# Patient Record
Sex: Female | Born: 1980
Health system: Southern US, Community
[De-identification: ages and names within clinical notes are randomized; demographics above are authoritative.]

## PROBLEM LIST (undated history)

## (undated) DIAGNOSIS — Z800 Family history of malignant neoplasm of digestive organs: Secondary | ICD-10-CM

## (undated) DIAGNOSIS — T7840XA Allergy, unspecified, initial encounter: Secondary | ICD-10-CM

## (undated) DIAGNOSIS — C801 Malignant (primary) neoplasm, unspecified: Secondary | ICD-10-CM

## (undated) DIAGNOSIS — N390 Urinary tract infection, site not specified: Secondary | ICD-10-CM

## (undated) DIAGNOSIS — F419 Anxiety disorder, unspecified: Secondary | ICD-10-CM

## (undated) DIAGNOSIS — Z9889 Other specified postprocedural states: Secondary | ICD-10-CM

## (undated) DIAGNOSIS — N63 Unspecified lump in unspecified breast: Secondary | ICD-10-CM

## (undated) DIAGNOSIS — F509 Eating disorder, unspecified: Secondary | ICD-10-CM

## (undated) DIAGNOSIS — R112 Nausea with vomiting, unspecified: Secondary | ICD-10-CM

## (undated) DIAGNOSIS — K900 Celiac disease: Secondary | ICD-10-CM

## (undated) HISTORY — PX: APPENDECTOMY: SHX54

## (undated) HISTORY — DX: Urinary tract infection, site not specified: N39.0

## (undated) HISTORY — DX: Allergy, unspecified, initial encounter: T78.40XA

## (undated) HISTORY — DX: Celiac disease: K90.0

## (undated) HISTORY — DX: Eating disorder, unspecified: F50.9

## (undated) HISTORY — DX: Family history of malignant neoplasm of digestive organs: Z80.0

## (undated) HISTORY — DX: Anxiety disorder, unspecified: F41.9

---

## 2008-12-13 HISTORY — PX: OTHER SURGICAL HISTORY: SHX169

## 2008-12-13 HISTORY — PX: AUGMENTATION MAMMAPLASTY: SUR837

## 2016-06-01 DIAGNOSIS — R509 Fever, unspecified: Secondary | ICD-10-CM | POA: Diagnosis not present

## 2016-06-01 DIAGNOSIS — H65192 Other acute nonsuppurative otitis media, left ear: Secondary | ICD-10-CM | POA: Diagnosis not present

## 2017-01-20 DIAGNOSIS — R6889 Other general symptoms and signs: Secondary | ICD-10-CM | POA: Diagnosis not present

## 2017-01-24 ENCOUNTER — Ambulatory Visit (INDEPENDENT_AMBULATORY_CARE_PROVIDER_SITE_OTHER): Payer: BLUE CROSS/BLUE SHIELD | Admitting: Family Medicine

## 2017-01-24 ENCOUNTER — Encounter: Payer: Self-pay | Admitting: Family Medicine

## 2017-01-24 VITALS — BP 90/64 | HR 72 | Temp 97.7°F | Ht 61.0 in | Wt 99.8 lb

## 2017-01-24 DIAGNOSIS — R6889 Other general symptoms and signs: Secondary | ICD-10-CM

## 2017-01-24 DIAGNOSIS — Z7689 Persons encountering health services in other specified circumstances: Secondary | ICD-10-CM

## 2017-01-24 NOTE — Patient Instructions (Signed)
It was a pleasure to meet you today! If your symptoms do not improve in 3 to 4 days, please let me know.  Also, please rest, drink plenty of fluids, and follow up for lab work as we discussed.

## 2017-01-24 NOTE — Progress Notes (Signed)
Pre visit review using our clinic review tool, if applicable. No additional management support is needed unless otherwise documented below in the visit note. 

## 2017-01-24 NOTE — Progress Notes (Signed)
Patient ID: Tabitha Brown, female   DOB: [birth year], 36 y.o.   MRN: [id number]  Patient presents to clinic today to establish care.  Acute Concerns:  Tabitha Brown is a 36 year old female who presents today with a cough that has been present for 5 days.  Cough is productive with yellow sputum but does not cause SOB.  Associated chills, myalgias, rhinitis with yellow drainage, ear pressure, and nasal congestion have been present.  She reports prior history of chills and sweats yesterday but she has not noticed this today.  Denies SOB, N/V, and tooth pain. Recent exposure to flu with her husband where she recently took Tamiflu prophylactically 3 weeks ago. She was evaluated and treated by urgent care 5 days ago where she was diagnosed with influenza and provided Tamiflu. She did not take tamiflu as she reports taking it previously.  She works as a Pharmacist, community. Treatment with advil and tylenol have provided moderate benefit for her symptoms today. She has a 37 year old and 78 month old. She is breastfeeding her 61 month old and has been wearing a mask for infection control.  History of exercise induced asthma. She denies use of albuterol during this episode. No history of bronchitis.   Health Maintenance: Dental --Twice Yearly [facility] --Contacts Immunizations --Influenza vaccine, Tdap PAP -- UTD, 2016 where she is followed by [facility] for her care.  Past Medical History:  Diagnosis Date  . Eating disorder   . UTI (urinary tract infection)   . UTI (urinary tract infection)      Social History   Social History  . Marital status: Married    Spouse name: N/A  . Number of children: N/A  . Years of education: N/A   Occupational History  . Not on file.   Social History Main Topics  . Smoking status: Never Smoker  . Smokeless tobacco: Never Used  . Alcohol use Yes  . Drug use: No  . Sexual activity: Yes   Other Topics Concern  . Not on file   Social History Narrative  . No narrative on  file    Past Surgical History:  Procedure Laterality Date  . APPENDECTOMY    . BREAST AUGMENTATION  2010  . CESAREAN SECTION      Family History  Problem Relation Age of Onset  . Alcohol abuse Maternal Grandfather   . Heart disease Maternal Grandfather   . Hypertension Paternal Grandmother     Allergies  Allergen Reactions  . Sulfa Antibiotics Hives  . Sulfonylureas     No current outpatient prescriptions on file prior to visit.   No current facility-administered medications on file prior to visit.     BP 90/64 (BP Location: Left Arm, Patient Position: Sitting, Cuff Size: Normal)   Pulse 72   Temp 97.7 F (36.5 C) (Oral)   Ht 5' 1"  (1.549 m)   Wt 99 lb 12.8 oz (45.3 kg)   LMP 01/01/2017 (Exact Date)   SpO2 99%   BMI 18.86 kg/m       Review of Systems  Constitutional: Positive for chills.  HENT: Positive for congestion and sinus pain. Negative for sore throat.        Ear pressure  Respiratory: Positive for cough and sputum production. Negative for shortness of breath and wheezing.   Cardiovascular: Negative for chest pain and orthopnea.  Gastrointestinal: Negative for abdominal pain, nausea and vomiting.       Loose stools x 1 yesterday  Genitourinary: Negative for flank  pain and frequency.  Musculoskeletal: Positive for myalgias.  Skin: Negative for rash.  Neurological: Negative for dizziness and headaches.     Physical Exam  Constitutional: She is oriented to person, place, and time.  Thin, optimally nourished female.  HENT:  Right Ear: Tympanic membrane normal.  Left Ear: Tympanic membrane normal.  Nose: Rhinorrhea present. Right sinus exhibits no maxillary sinus tenderness and no frontal sinus tenderness. Left sinus exhibits no maxillary sinus tenderness and no frontal sinus tenderness.  Mouth/Throat: Mucous membranes are normal. No oropharyngeal exudate or posterior oropharyngeal erythema.  Eyes: Pupils are equal, round, and reactive to light. No  scleral icterus.  Neck: Neck supple.  Cardiovascular: Normal rate, regular rhythm and intact distal pulses.   Pulmonary/Chest: Effort normal and breath sounds normal. She has no wheezes. She has no rales.  Abdominal: Soft. Bowel sounds are normal. There is no tenderness.  Lymphadenopathy:    She has no cervical adenopathy.  Neurological: She is alert and oriented to person, place, and time.  Skin: Skin is warm and dry. No rash noted.     Assessment/Plan:  1. Flu-like symptoms Exam and history support viral etiology. Advised supportive measures of increasing fluids, rest, ibuprofen, or tylenol for discomfort.  She does not wish to take the tamiflu provided to her and at this point it is past the window of time for effectiveness. Follow up recommendations were provided such as fever >101, worsening symptoms, or cough that is worsening or causing use of albuterol. Handwashing discussed with infection control concerns while symptoms are present. She voiced understanding and agreed with plan.  2. Encounter to establish care Follow up for physical and lab work at her convenience. We reviewed the PMH, PSH, FH, SH, Meds and Allergies. -We provided refills for any medications we will prescribe as needed. -We addressed current concerns per orders and patient instructions. -We have asked for records for pertinent exams, studies, vaccines and notes from previous providers. -We have advised patient to follow up per instructions below.   Delano Metz, FNP-C

## 2017-07-26 DIAGNOSIS — N63 Unspecified lump in unspecified breast: Secondary | ICD-10-CM

## 2017-07-26 HISTORY — DX: Unspecified lump in unspecified breast: N63.0

## 2017-08-11 DIAGNOSIS — N949 Unspecified condition associated with female genital organs and menstrual cycle: Secondary | ICD-10-CM | POA: Diagnosis not present

## 2017-08-11 DIAGNOSIS — Z01419 Encounter for gynecological examination (general) (routine) without abnormal findings: Secondary | ICD-10-CM | POA: Diagnosis not present

## 2017-08-11 DIAGNOSIS — N6311 Unspecified lump in the right breast, upper outer quadrant: Secondary | ICD-10-CM | POA: Diagnosis not present

## 2017-08-16 ENCOUNTER — Other Ambulatory Visit: Payer: Self-pay | Admitting: Obstetrics and Gynecology

## 2017-08-16 DIAGNOSIS — N63 Unspecified lump in unspecified breast: Secondary | ICD-10-CM

## 2017-08-25 ENCOUNTER — Ambulatory Visit
Admission: RE | Admit: 2017-08-25 | Discharge: 2017-08-25 | Disposition: A | Discharge status: Home / Self Care | Payer: BLUE CROSS/BLUE SHIELD | Source: Ambulatory Visit | Attending: Obstetrics and Gynecology | Admitting: Obstetrics and Gynecology

## 2017-08-25 DIAGNOSIS — R922 Inconclusive mammogram: Secondary | ICD-10-CM | POA: Diagnosis not present

## 2017-08-25 DIAGNOSIS — N6489 Other specified disorders of breast: Secondary | ICD-10-CM | POA: Diagnosis not present

## 2017-08-25 DIAGNOSIS — N63 Unspecified lump in unspecified breast: Secondary | ICD-10-CM

## 2017-08-25 HISTORY — DX: Unspecified lump in unspecified breast: N63.0

## 2017-10-21 ENCOUNTER — Encounter: Payer: Self-pay | Admitting: Family Medicine

## 2017-10-21 ENCOUNTER — Ambulatory Visit: Payer: BLUE CROSS/BLUE SHIELD | Admitting: Family Medicine

## 2017-10-21 VITALS — BP 102/76 | HR 60 | Temp 97.9°F | Resp 12 | Ht 61.0 in | Wt 104.1 lb

## 2017-10-21 DIAGNOSIS — N390 Urinary tract infection, site not specified: Secondary | ICD-10-CM

## 2017-10-21 DIAGNOSIS — R399 Unspecified symptoms and signs involving the genitourinary system: Secondary | ICD-10-CM | POA: Diagnosis not present

## 2017-10-21 LAB — POCT URINALYSIS DIPSTICK
Bilirubin, UA: NEGATIVE
Blood, UA: NEGATIVE
GLUCOSE UA: NEGATIVE
Ketones, UA: NEGATIVE
Nitrite, UA: NEGATIVE
Protein, UA: NEGATIVE
SPEC GRAV UA: 1.01 (ref 1.010–1.025)
Urobilinogen, UA: 0.2 E.U./dL
pH, UA: 6.5 (ref 5.0–8.0)

## 2017-10-21 MED ORDER — NITROFURANTOIN MONOHYD MACRO 100 MG PO CAPS: 100.0000 mg | ORAL_CAPSULE | Freq: Two times a day (BID) | ORAL | 0 refills | Status: AC

## 2017-10-21 NOTE — Progress Notes (Signed)
HPI:   Tabitha Brown is a 36 y.o. female, who is here today complaining of dysuria that started yesterday.  She is reporting history of UTIs since age 63 due to "kinked" ureter.  She has had 1-2 UTI's in the past 5 years.  Urinary frequency: Yes Urinary urgency: Denies Incontinence: Denies Gross hematuria: Denies  Abdominal pain: She was having mild LLQ and RLQ dull pain, not sure of related to urinary symptoms.  She denies changes in bowel habits or blood in the stool.  Exacerbating or alleviating factors not identified.  Nausea or vomiting: Denies  Abnormal vaginal bleeding or discharge: Denies  LMP: A week ago Sexual activity: Yes, no more than usual. Due to work schedule she has been holding urine longer than usual. She is drinking fluids during the day as usual, tried to stay hydrated. She drinks vinegar with water daily.  OTC medications for this problem: None   Review of Systems  Constitutional: Negative for activity change, appetite change, fatigue and fever.  Gastrointestinal: Positive for abdominal pain. Negative for blood in stool, nausea and vomiting.       No changes in bowel habits.  Endocrine: Negative for polydipsia, polyphagia and polyuria.  Genitourinary: Positive for dysuria and frequency. Negative for decreased urine volume, hematuria, urgency, vaginal bleeding and vaginal discharge.  Musculoskeletal: Negative for back pain and myalgias.  Skin: Negative for pallor and rash.      Current Outpatient Medications on File Prior to Visit  Medication Sig Dispense Refill  . Prenatal Vit-Fe Fumarate-FA (PRENATAL MULTIVITAMIN) TABS tablet Take 1 tablet by mouth daily at 12 noon.     No current facility-administered medications on file prior to visit.      Past Medical History:  Diagnosis Date  . Breast mass 07/26/2017   Right breast lump x 1 month   . Eating disorder   . UTI (urinary tract infection)   . UTI (urinary tract infection)     Allergies  Allergen Reactions  . Sulfa Antibiotics Hives  . Sulfonylureas     Social History   Socioeconomic History  . Marital status: Married    Spouse name: None  . Number of children: None  . Years of education: None  . Highest education level: None  Social Needs  . Financial resource strain: None  . Food insecurity - worry: None  . Food insecurity - inability: None  . Transportation needs - medical: None  . Transportation needs - non-medical: None  Occupational History  . None  Tobacco Use  . Smoking status: Never Smoker  . Smokeless tobacco: Never Used  Substance and Sexual Activity  . Alcohol use: Yes  . Drug use: No  . Sexual activity: Yes  Other Topics Concern  . None  Social History Narrative  . None    Vitals:   10/21/17 1101  BP: 102/76  Pulse: 60  Resp: 12  Temp: 97.9 F (36.6 C)  SpO2: 98%   Body mass index is 19.67 kg/m.   Physical Exam  Nursing note and vitals reviewed. Constitutional: She is oriented to person, place, and time. She appears well-developed and well-nourished. No distress.  HENT:  Head: Normocephalic and atraumatic.  Mouth/Throat: Oropharynx is clear and moist and mucous membranes are normal.  Eyes: Conjunctivae are normal.  Cardiovascular: Normal rate and regular rhythm.  No murmur heard. Respiratory: Effort normal and breath sounds normal. No respiratory distress.  GI: Soft. She exhibits no mass. There is no tenderness.  There is no CVA tenderness.  Musculoskeletal: She exhibits no edema or tenderness.  Lymphadenopathy:    She has no cervical adenopathy.  Neurological: She is alert and oriented to person, place, and time. She has normal strength. Gait normal.  Skin: Skin is warm. No rash noted. No erythema.  Psychiatric: She has a normal mood and affect.  Adequate groomed,good eye contact.    ASSESSMENT AND PLAN:   Diagnoses and all orders for this visit:  UTI symptoms -     POCT urinalysis  dipstick  Urinary tract infection without hematuria, site unspecified -     nitrofurantoin, macrocrystal-monohydrate, (MACROBID) 100 MG capsule; Take 1 capsule (100 mg total) 2 (two) times daily for 5 days by mouth. -     Culture, Urine   Urine dipstick + LEUK.  Differential Dx's for dysuria or urinary symptoms discussed. Ucx ordered.   Empiric abx treatment started today and will be tailored according to Ucx results and susceptibility report. Instructed about warning signs. F/U if symptoms persist.    -Tabitha Brown was advised to return or notify a doctor immediately if symptoms worsen or persist or new concerns arise.     Mohd Clemons G. Martinique, MD  [facility]. [place] office.

## 2017-10-21 NOTE — Patient Instructions (Signed)
A few things to remember from today's visit:   UTI symptoms - Plan: POCT urinalysis dipstick  Urinary tract infection without hematuria, site unspecified - Plan: nitrofurantoin, macrocrystal-monohydrate, (MACROBID) 100 MG capsule    Adequate fluid intake, avoid holding urine for long hours, and over the counter Vit C OR cranberry capsules might help.  Today we will treat empirically with antibiotic, which we might need to change when urine culture comes back depending of bacteria susceptibility.  Seek immediate medical attention if severe abdominal pain, vomiting, fever/chills, or worsening symptoms. F/U if symptomatic are not any better after 2-3 days of antibiotic treatment.  Please be sure medication list is accurate. If a new problem present, please set up appointment sooner than planned today.

## 2017-10-22 LAB — URINE CULTURE
MICRO NUMBER: 81264853
SPECIMEN QUALITY: ADEQUATE

## 2018-01-31 ENCOUNTER — Encounter: Payer: Self-pay | Admitting: Family Medicine

## 2018-01-31 ENCOUNTER — Ambulatory Visit: Payer: BLUE CROSS/BLUE SHIELD | Admitting: Family Medicine

## 2018-01-31 VITALS — BP 104/60 | HR 65 | Temp 98.2°F | Resp 12 | Ht 61.0 in | Wt 103.4 lb

## 2018-01-31 DIAGNOSIS — G4489 Other headache syndrome: Secondary | ICD-10-CM

## 2018-01-31 DIAGNOSIS — M542 Cervicalgia: Secondary | ICD-10-CM

## 2018-01-31 DIAGNOSIS — R42 Dizziness and giddiness: Secondary | ICD-10-CM | POA: Diagnosis not present

## 2018-01-31 LAB — CBC
HCT: 40 % (ref 36.0–46.0)
Hemoglobin: 13.5 g/dL (ref 12.0–15.0)
MCHC: 33.8 g/dL (ref 30.0–36.0)
MCV: 92.8 fl (ref 78.0–100.0)
Platelets: 238 10*3/uL (ref 150.0–400.0)
RBC: 4.31 Mil/uL (ref 3.87–5.11)
RDW: 12.1 % (ref 11.5–15.5)
WBC: 5 10*3/uL (ref 4.0–10.5)

## 2018-01-31 LAB — TSH: TSH: 2.65 u[IU]/mL (ref 0.35–4.50)

## 2018-01-31 LAB — BASIC METABOLIC PANEL
BUN: 10 mg/dL (ref 6–23)
CALCIUM: 9.4 mg/dL (ref 8.4–10.5)
CHLORIDE: 104 meq/L (ref 96–112)
CO2: 30 meq/L (ref 19–32)
Creatinine, Ser: 0.73 mg/dL (ref 0.40–1.20)
GFR: 94.99 mL/min (ref >60.00)
Glucose, Bld: 80 mg/dL (ref 70–99)
POTASSIUM: 4 meq/L (ref 3.5–5.1)
SODIUM: 140 meq/L (ref 135–145)

## 2018-01-31 MED ORDER — CYCLOBENZAPRINE HCL 10 MG PO TABS: 5.0000 mg | ORAL_TABLET | Freq: Every day | ORAL | 0 refills | Status: DC

## 2018-01-31 NOTE — Patient Instructions (Signed)
A few things to remember from today's visit:   Cervicalgia - Plan: cyclobenzaprine (FLEXERIL) 10 MG tablet  Other headache syndrome - Plan: MR Brain Wo Contrast, CBC, TSH, Basic metabolic panel  Dizziness, nonspecific - Plan: MR Brain Wo Contrast, CBC, TSH, Basic metabolic panel   General Headache Without Cause A headache is pain or discomfort felt around the head or neck area. There are many causes and types of headaches. In some cases, the cause may not be found. Follow these instructions at home: Managing pain  Take over-the-counter and prescription medicines only as told by your doctor.  Lie down in a dark, quiet room when you have a headache.  If directed, apply ice to the head and neck area: ? Put ice in a plastic bag. ? Place a towel between your skin and the bag. ? Leave the ice on for 20 minutes, 2-3 times per day.  Use a heating pad or hot shower to apply heat to the head and neck area as told by your doctor.  Keep lights dim if bright lights bother you or make your headaches worse. Eating and drinking  Eat meals on a regular schedule.  Lessen how much alcohol you drink.  Lessen how much caffeine you drink, or stop drinking caffeine. General instructions  Keep all follow-up visits as told by your doctor. This is important.  Keep a journal to find out if certain things bring on headaches. For example, write down: ? What you eat and drink. ? How much sleep you get. ? Any change to your diet or medicines.  Relax by getting a massage or doing other relaxing activities.  Lessen stress.  Sit up straight. Do not tighten (tense) your muscles.  Do not use tobacco products. This includes cigarettes, chewing tobacco, or e-cigarettes. If you need help quitting, ask your doctor.  Exercise regularly as told by your doctor.  Get enough sleep. This often means 7-9 hours of sleep. Contact a doctor if:  Your symptoms are not helped by medicine.  You have a headache  that feels different than the other headaches.  You feel sick to your stomach (nauseous) or you throw up (vomit).  You have a fever. Get help right away if:  Your headache becomes really bad.  You keep throwing up.  You have a stiff neck.  You have trouble seeing.  You have trouble speaking.  You have pain in the eye or ear.  Your muscles are weak or you lose muscle control.  You lose your balance or have trouble walking.  You feel like you will pass out (faint) or you pass out.  You have confusion. This information is not intended to replace advice given to you by your health care provider. Make sure you discuss any questions you have with your health care provider. Document Released: 09/07/2008 Document Revised: 05/06/2016 Document Reviewed: 03/24/2015 Elsevier Interactive Patient Education  [employer].  Please be sure medication list is accurate. If a new problem present, please set up appointment sooner than planned today.
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
## 2018-02-01 ENCOUNTER — Telehealth: Payer: Self-pay | Admitting: Family Medicine

## 2018-02-01 NOTE — Telephone Encounter (Signed)
Copied from [place] [id number]. Topic: Quick Communication - Lab Results >> Feb 01, 2018  9:15 AM Lolita Rieger, RMA wrote: Pt returning call for lab results

## 2018-02-01 NOTE — Telephone Encounter (Signed)
I have tried to contact her 2 times,last time I left message.  Labs done are in normal range (CBC,renal function,electrolytes,and thyroid function test). Head and cervical MRI have already been approved by her insurance and pending.  Thanks, BJ

## 2018-02-02 NOTE — Telephone Encounter (Signed)
Patient informed of lab results and verbalized understanding.

## 2018-02-03 ENCOUNTER — Encounter: Payer: Self-pay | Admitting: Family Medicine

## 2018-02-16 ENCOUNTER — Other Ambulatory Visit: Payer: BLUE CROSS/BLUE SHIELD

## 2018-02-23 ENCOUNTER — Ambulatory Visit
Admission: RE | Admit: 2018-02-23 | Discharge: 2018-02-23 | Disposition: A | Discharge status: Home / Self Care | Payer: BLUE CROSS/BLUE SHIELD | Source: Ambulatory Visit | Attending: Family Medicine | Admitting: Family Medicine

## 2018-02-23 DIAGNOSIS — M542 Cervicalgia: Secondary | ICD-10-CM

## 2018-02-23 DIAGNOSIS — R42 Dizziness and giddiness: Secondary | ICD-10-CM | POA: Diagnosis not present

## 2018-02-23 DIAGNOSIS — R51 Headache: Secondary | ICD-10-CM | POA: Diagnosis not present

## 2018-02-23 DIAGNOSIS — M5030 Other cervical disc degeneration, unspecified cervical region: Secondary | ICD-10-CM | POA: Diagnosis not present

## 2018-02-23 DIAGNOSIS — G4489 Other headache syndrome: Secondary | ICD-10-CM

## 2018-02-25 ENCOUNTER — Encounter: Payer: Self-pay | Admitting: Family Medicine

## 2018-02-27 ENCOUNTER — Encounter: Payer: Self-pay | Admitting: Family Medicine

## 2018-04-27 ENCOUNTER — Other Ambulatory Visit: Payer: Self-pay | Admitting: Family Medicine

## 2018-04-27 DIAGNOSIS — M542 Cervicalgia: Secondary | ICD-10-CM

## 2018-04-27 NOTE — Telephone Encounter (Signed)
Copied from [place] [id number]. Topic: Quick Communication - Rx Refill/Question >> Apr 27, 2018 12:43 PM Synthia Innocent wrote: Medication: cyclobenzaprine (FLEXERIL) 10 MG tablet  Has the patient contacted their pharmacy? Yes.   (Agent: If no, request that the patient contact the pharmacy for the refill.) Preferred Pharmacy (with phone number or street name): [facility] [address] Agent: Please be advised that RX refills may take up to 3 business days. We ask that you follow-up with your pharmacy.

## 2018-04-27 NOTE — Telephone Encounter (Signed)
LOV 01/31/18 Dr. Martinique Last refilled 01/31/18  # 30  0 refill

## 2018-04-28 MED ORDER — CYCLOBENZAPRINE HCL 10 MG PO TABS: 5.0000 mg | ORAL_TABLET | Freq: Every day | ORAL | 2 refills | Status: DC

## 2018-05-04 DIAGNOSIS — N926 Irregular menstruation, unspecified: Secondary | ICD-10-CM | POA: Diagnosis not present

## 2018-05-18 DIAGNOSIS — E2839 Other primary ovarian failure: Secondary | ICD-10-CM | POA: Diagnosis not present

## 2018-05-18 DIAGNOSIS — Z31430 Encounter of female for testing for genetic disease carrier status for procreative management: Secondary | ICD-10-CM | POA: Diagnosis not present

## 2018-05-18 DIAGNOSIS — Z319 Encounter for procreative management, unspecified: Secondary | ICD-10-CM | POA: Diagnosis not present

## 2018-05-24 DIAGNOSIS — E039 Hypothyroidism, unspecified: Secondary | ICD-10-CM | POA: Diagnosis not present

## 2018-05-27 DIAGNOSIS — N83291 Other ovarian cyst, right side: Secondary | ICD-10-CM | POA: Diagnosis not present

## 2018-05-27 DIAGNOSIS — E288 Other ovarian dysfunction: Secondary | ICD-10-CM | POA: Diagnosis not present

## 2018-05-27 DIAGNOSIS — E2839 Other primary ovarian failure: Secondary | ICD-10-CM | POA: Diagnosis not present

## 2018-06-22 DIAGNOSIS — N926 Irregular menstruation, unspecified: Secondary | ICD-10-CM | POA: Diagnosis not present

## 2018-06-22 DIAGNOSIS — N979 Female infertility, unspecified: Secondary | ICD-10-CM | POA: Diagnosis not present

## 2018-06-22 DIAGNOSIS — E2839 Other primary ovarian failure: Secondary | ICD-10-CM | POA: Diagnosis not present

## 2018-06-22 DIAGNOSIS — N912 Amenorrhea, unspecified: Secondary | ICD-10-CM | POA: Diagnosis not present

## 2018-06-28 DIAGNOSIS — E2839 Other primary ovarian failure: Secondary | ICD-10-CM | POA: Diagnosis not present

## 2018-06-28 DIAGNOSIS — N979 Female infertility, unspecified: Secondary | ICD-10-CM | POA: Diagnosis not present

## 2018-06-28 DIAGNOSIS — N83292 Other ovarian cyst, left side: Secondary | ICD-10-CM | POA: Diagnosis not present

## 2018-06-28 DIAGNOSIS — N926 Irregular menstruation, unspecified: Secondary | ICD-10-CM | POA: Diagnosis not present

## 2018-07-05 DIAGNOSIS — Z3189 Encounter for other procreative management: Secondary | ICD-10-CM | POA: Diagnosis not present

## 2018-07-05 DIAGNOSIS — E2839 Other primary ovarian failure: Secondary | ICD-10-CM | POA: Diagnosis not present

## 2018-07-08 DIAGNOSIS — E2839 Other primary ovarian failure: Secondary | ICD-10-CM | POA: Diagnosis not present

## 2018-07-08 DIAGNOSIS — Z319 Encounter for procreative management, unspecified: Secondary | ICD-10-CM | POA: Diagnosis not present

## 2018-07-11 DIAGNOSIS — Z3189 Encounter for other procreative management: Secondary | ICD-10-CM | POA: Diagnosis not present

## 2018-07-30 DIAGNOSIS — Z3183 Encounter for assisted reproductive fertility procedure cycle: Secondary | ICD-10-CM | POA: Diagnosis not present

## 2018-08-03 DIAGNOSIS — N8500 Endometrial hyperplasia, unspecified: Secondary | ICD-10-CM | POA: Diagnosis not present

## 2018-08-03 DIAGNOSIS — Z3141 Encounter for fertility testing: Secondary | ICD-10-CM | POA: Diagnosis not present

## 2018-08-03 DIAGNOSIS — Z113 Encounter for screening for infections with a predominantly sexual mode of transmission: Secondary | ICD-10-CM | POA: Diagnosis not present

## 2018-08-03 DIAGNOSIS — E2839 Other primary ovarian failure: Secondary | ICD-10-CM | POA: Diagnosis not present

## 2018-08-17 DIAGNOSIS — Z3183 Encounter for assisted reproductive fertility procedure cycle: Secondary | ICD-10-CM | POA: Diagnosis not present

## 2018-08-24 DIAGNOSIS — Z3183 Encounter for assisted reproductive fertility procedure cycle: Secondary | ICD-10-CM | POA: Diagnosis not present

## 2018-08-28 DIAGNOSIS — N979 Female infertility, unspecified: Secondary | ICD-10-CM | POA: Diagnosis not present

## 2018-08-28 DIAGNOSIS — E2839 Other primary ovarian failure: Secondary | ICD-10-CM | POA: Diagnosis not present

## 2018-08-28 DIAGNOSIS — Z3183 Encounter for assisted reproductive fertility procedure cycle: Secondary | ICD-10-CM | POA: Diagnosis not present

## 2018-08-28 DIAGNOSIS — N926 Irregular menstruation, unspecified: Secondary | ICD-10-CM | POA: Diagnosis not present

## 2018-08-30 DIAGNOSIS — Z3183 Encounter for assisted reproductive fertility procedure cycle: Secondary | ICD-10-CM | POA: Diagnosis not present

## 2018-08-30 DIAGNOSIS — N926 Irregular menstruation, unspecified: Secondary | ICD-10-CM | POA: Diagnosis not present

## 2018-08-30 DIAGNOSIS — E2839 Other primary ovarian failure: Secondary | ICD-10-CM | POA: Diagnosis not present

## 2018-08-30 DIAGNOSIS — N979 Female infertility, unspecified: Secondary | ICD-10-CM | POA: Diagnosis not present

## 2018-09-01 DIAGNOSIS — N926 Irregular menstruation, unspecified: Secondary | ICD-10-CM | POA: Diagnosis not present

## 2018-09-01 DIAGNOSIS — N979 Female infertility, unspecified: Secondary | ICD-10-CM | POA: Diagnosis not present

## 2018-09-01 DIAGNOSIS — Z3183 Encounter for assisted reproductive fertility procedure cycle: Secondary | ICD-10-CM | POA: Diagnosis not present

## 2018-09-01 DIAGNOSIS — Z113 Encounter for screening for infections with a predominantly sexual mode of transmission: Secondary | ICD-10-CM | POA: Diagnosis not present

## 2018-09-04 DIAGNOSIS — Z113 Encounter for screening for infections with a predominantly sexual mode of transmission: Secondary | ICD-10-CM | POA: Diagnosis not present

## 2018-09-04 DIAGNOSIS — N926 Irregular menstruation, unspecified: Secondary | ICD-10-CM | POA: Diagnosis not present

## 2018-09-04 DIAGNOSIS — N979 Female infertility, unspecified: Secondary | ICD-10-CM | POA: Diagnosis not present

## 2018-09-04 DIAGNOSIS — Z3183 Encounter for assisted reproductive fertility procedure cycle: Secondary | ICD-10-CM | POA: Diagnosis not present

## 2018-09-06 DIAGNOSIS — Z3183 Encounter for assisted reproductive fertility procedure cycle: Secondary | ICD-10-CM | POA: Diagnosis not present

## 2018-09-06 DIAGNOSIS — N979 Female infertility, unspecified: Secondary | ICD-10-CM | POA: Diagnosis not present

## 2018-09-06 DIAGNOSIS — E2839 Other primary ovarian failure: Secondary | ICD-10-CM | POA: Diagnosis not present

## 2018-09-08 DIAGNOSIS — Z3183 Encounter for assisted reproductive fertility procedure cycle: Secondary | ICD-10-CM | POA: Diagnosis not present

## 2018-10-05 ENCOUNTER — Encounter: Payer: Self-pay | Admitting: Family Medicine

## 2018-10-05 ENCOUNTER — Ambulatory Visit: Payer: BLUE CROSS/BLUE SHIELD | Admitting: Family Medicine

## 2018-10-05 VITALS — BP 98/64 | HR 84 | Temp 98.2°F | Wt 102.0 lb

## 2018-10-05 DIAGNOSIS — R52 Pain, unspecified: Secondary | ICD-10-CM | POA: Diagnosis not present

## 2018-10-05 DIAGNOSIS — J101 Influenza due to other identified influenza virus with other respiratory manifestations: Secondary | ICD-10-CM | POA: Diagnosis not present

## 2018-10-05 LAB — POCT INFLUENZA A/B
INFLUENZA A, POC: POSITIVE — AB
INFLUENZA B, POC: NEGATIVE

## 2018-10-05 NOTE — Addendum Note (Signed)
Addended by: Wyvonne Lenz on: 10/05/2018 05:51 PM   Modules accepted: Orders

## 2018-10-05 NOTE — Patient Instructions (Signed)

## 2018-10-05 NOTE — Progress Notes (Signed)
Subjective:    Patient ID: Tabitha Brown, female    DOB: [birth year], 37 y.o.   MRN: [id number]  No chief complaint on file.   HPI Patient was seen today for acute concern.  Pt endorses fatigue, chills, body aches, decreased appetite, cough, runny nose, loose stools since Sunday.  Pt has tried NyQuil and Tylenol Cold.  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   Past Medical History:  Diagnosis Date  . Breast mass 07/26/2017   Right breast lump x 1 month   . Eating disorder   . UTI (urinary tract infection)     Allergies  Allergen Reactions  . Sulfa Antibiotics Hives  . Sulfonylureas     ROS General: Denies fever, chills, night sweats, changes in weight, changes in appetite  + fatigue, chills, body aches, decreased appetite HEENT: Denies headaches, ear pain, changes in vision, rhinorrhea, sore throat CV: Denies CP, palpitations, SOB, orthopnea Pulm: Denies SOB, wheezing  +cough, rhinorrhea GI: Denies abdominal pain, nausea, vomiting, diarrhea, constipation  + loose stools GU: Denies dysuria, hematuria, frequency, vaginal discharge Msk: Denies muscle cramps, joint pains Neuro: Denies weakness, numbness, tingling Skin: Denies rashes, bruising Psych: Denies depression, anxiety, hallucinations     Objective:    Blood pressure 98/64, pulse 84, temperature 98.2 F (36.8 C), temperature source Oral, weight 102 lb (46.3 kg), SpO2 98 %.  Gen. Pleasant, well-nourished, in no distress, normal affect  HEENT: [place]/AT, face symmetric, no scleral icterus, PERRLA, nares patent without drainage, pharynx without erythema or exudate. TMs full b/l, mild cervical lymphadenopathy Lungs: no accessory muscle use, CTAB, no wheezes or rales Cardiovascular: RRR, no m/r/g, no peripheral edema  Wt Readings from Last 3 Encounters:  10/05/18 102 lb (46.3 kg)  01/31/18 103 lb 6 oz (46.9 kg)  10/21/17 104 lb 2 oz (47.2 kg)    Lab Results  Component  Value Date   WBC 5.0 01/31/2018   HGB 13.5 01/31/2018   HCT 40.0 01/31/2018   PLT 238.0 01/31/2018   GLUCOSE 80 01/31/2018   NA 140 01/31/2018   K 4.0 01/31/2018   CL 104 01/31/2018   CREATININE 0.73 01/31/2018   BUN 10 01/31/2018   CO2 30 01/31/2018   TSH 2.65 01/31/2018    Assessment/Plan:  Influenza A  -Given duration of symptoms antiviral such as Tamiflu would not be much benefit -Discussed supportive care such as gargling with warm salt water or Chloraseptic spray, Tylenol or ibuprofen for any pain/discomfort or fever, OTC cough and cold medications. -Discussed washing hands frequently and avoiding sharing utensils with others -Given RTC or ED precautions.  Follow-up PRN  Grier Mitts, MD

## 2018-10-06 ENCOUNTER — Ambulatory Visit: Payer: BLUE CROSS/BLUE SHIELD | Admitting: Family Medicine

## 2018-10-18 DIAGNOSIS — E2839 Other primary ovarian failure: Secondary | ICD-10-CM | POA: Diagnosis not present

## 2018-10-18 DIAGNOSIS — N926 Irregular menstruation, unspecified: Secondary | ICD-10-CM | POA: Diagnosis not present

## 2018-10-18 DIAGNOSIS — Z3183 Encounter for assisted reproductive fertility procedure cycle: Secondary | ICD-10-CM | POA: Diagnosis not present

## 2018-10-18 DIAGNOSIS — N979 Female infertility, unspecified: Secondary | ICD-10-CM | POA: Diagnosis not present

## 2018-11-15 DIAGNOSIS — N979 Female infertility, unspecified: Secondary | ICD-10-CM | POA: Diagnosis not present

## 2018-11-15 DIAGNOSIS — N644 Mastodynia: Secondary | ICD-10-CM | POA: Diagnosis not present

## 2018-11-15 DIAGNOSIS — E2839 Other primary ovarian failure: Secondary | ICD-10-CM | POA: Diagnosis not present

## 2018-11-15 DIAGNOSIS — N83291 Other ovarian cyst, right side: Secondary | ICD-10-CM | POA: Diagnosis not present

## 2018-11-15 DIAGNOSIS — N926 Irregular menstruation, unspecified: Secondary | ICD-10-CM | POA: Diagnosis not present

## 2018-12-14 DIAGNOSIS — N83291 Other ovarian cyst, right side: Secondary | ICD-10-CM | POA: Diagnosis not present

## 2018-12-14 DIAGNOSIS — N979 Female infertility, unspecified: Secondary | ICD-10-CM | POA: Diagnosis not present

## 2018-12-14 DIAGNOSIS — N926 Irregular menstruation, unspecified: Secondary | ICD-10-CM | POA: Diagnosis not present

## 2018-12-14 DIAGNOSIS — E2839 Other primary ovarian failure: Secondary | ICD-10-CM | POA: Diagnosis not present

## 2018-12-14 DIAGNOSIS — Z3183 Encounter for assisted reproductive fertility procedure cycle: Secondary | ICD-10-CM | POA: Diagnosis not present

## 2018-12-20 DIAGNOSIS — N979 Female infertility, unspecified: Secondary | ICD-10-CM | POA: Diagnosis not present

## 2018-12-20 DIAGNOSIS — E2839 Other primary ovarian failure: Secondary | ICD-10-CM | POA: Diagnosis not present

## 2018-12-20 DIAGNOSIS — Z3183 Encounter for assisted reproductive fertility procedure cycle: Secondary | ICD-10-CM | POA: Diagnosis not present

## 2018-12-22 DIAGNOSIS — Z3183 Encounter for assisted reproductive fertility procedure cycle: Secondary | ICD-10-CM | POA: Diagnosis not present

## 2018-12-22 DIAGNOSIS — E2839 Other primary ovarian failure: Secondary | ICD-10-CM | POA: Diagnosis not present

## 2018-12-22 DIAGNOSIS — N979 Female infertility, unspecified: Secondary | ICD-10-CM | POA: Diagnosis not present

## 2018-12-23 DIAGNOSIS — N979 Female infertility, unspecified: Secondary | ICD-10-CM | POA: Diagnosis not present

## 2018-12-23 DIAGNOSIS — E2839 Other primary ovarian failure: Secondary | ICD-10-CM | POA: Diagnosis not present

## 2018-12-23 DIAGNOSIS — Z3183 Encounter for assisted reproductive fertility procedure cycle: Secondary | ICD-10-CM | POA: Diagnosis not present

## 2018-12-25 DIAGNOSIS — Z3183 Encounter for assisted reproductive fertility procedure cycle: Secondary | ICD-10-CM | POA: Diagnosis not present

## 2018-12-30 DIAGNOSIS — Z3183 Encounter for assisted reproductive fertility procedure cycle: Secondary | ICD-10-CM | POA: Diagnosis not present

## 2019-01-21 ENCOUNTER — Encounter: Payer: Self-pay | Admitting: Family Medicine

## 2019-01-22 ENCOUNTER — Other Ambulatory Visit: Payer: Self-pay | Admitting: Family Medicine

## 2019-01-22 DIAGNOSIS — M542 Cervicalgia: Secondary | ICD-10-CM

## 2019-01-22 MED ORDER — CYCLOBENZAPRINE HCL 10 MG PO TABS: 5.0000 mg | ORAL_TABLET | Freq: Every day | ORAL | 1 refills | Status: DC

## 2019-02-02 DIAGNOSIS — N979 Female infertility, unspecified: Secondary | ICD-10-CM | POA: Diagnosis not present

## 2019-02-02 DIAGNOSIS — Z3183 Encounter for assisted reproductive fertility procedure cycle: Secondary | ICD-10-CM | POA: Diagnosis not present

## 2019-02-02 DIAGNOSIS — Z319 Encounter for procreative management, unspecified: Secondary | ICD-10-CM | POA: Diagnosis not present

## 2019-02-02 DIAGNOSIS — E2839 Other primary ovarian failure: Secondary | ICD-10-CM | POA: Diagnosis not present

## 2019-02-09 DIAGNOSIS — E2839 Other primary ovarian failure: Secondary | ICD-10-CM | POA: Diagnosis not present

## 2019-02-09 DIAGNOSIS — N979 Female infertility, unspecified: Secondary | ICD-10-CM | POA: Diagnosis not present

## 2019-02-09 DIAGNOSIS — Z3183 Encounter for assisted reproductive fertility procedure cycle: Secondary | ICD-10-CM | POA: Diagnosis not present

## 2019-02-12 DIAGNOSIS — E2839 Other primary ovarian failure: Secondary | ICD-10-CM | POA: Diagnosis not present

## 2019-02-12 DIAGNOSIS — Z3183 Encounter for assisted reproductive fertility procedure cycle: Secondary | ICD-10-CM | POA: Diagnosis not present

## 2019-02-12 DIAGNOSIS — N979 Female infertility, unspecified: Secondary | ICD-10-CM | POA: Diagnosis not present

## 2019-02-13 DIAGNOSIS — E2839 Other primary ovarian failure: Secondary | ICD-10-CM | POA: Diagnosis not present

## 2019-02-13 DIAGNOSIS — N979 Female infertility, unspecified: Secondary | ICD-10-CM | POA: Diagnosis not present

## 2019-02-13 DIAGNOSIS — Z3183 Encounter for assisted reproductive fertility procedure cycle: Secondary | ICD-10-CM | POA: Diagnosis not present

## 2019-02-15 DIAGNOSIS — Z3183 Encounter for assisted reproductive fertility procedure cycle: Secondary | ICD-10-CM | POA: Diagnosis not present

## 2019-02-20 DIAGNOSIS — Z3183 Encounter for assisted reproductive fertility procedure cycle: Secondary | ICD-10-CM | POA: Diagnosis not present

## 2019-02-21 DIAGNOSIS — Z3183 Encounter for assisted reproductive fertility procedure cycle: Secondary | ICD-10-CM | POA: Diagnosis not present

## 2019-05-29 ENCOUNTER — Other Ambulatory Visit: Payer: Self-pay | Admitting: Obstetrics and Gynecology

## 2019-05-29 DIAGNOSIS — N6310 Unspecified lump in the right breast, unspecified quadrant: Secondary | ICD-10-CM

## 2019-06-05 ENCOUNTER — Ambulatory Visit
Admission: RE | Admit: 2019-06-05 | Discharge: 2019-06-05 | Disposition: A | Discharge status: Home / Self Care | Payer: BC Managed Care – PPO | Source: Ambulatory Visit | Attending: Obstetrics and Gynecology | Admitting: Obstetrics and Gynecology

## 2019-06-05 ENCOUNTER — Other Ambulatory Visit: Payer: Self-pay | Admitting: Obstetrics and Gynecology

## 2019-06-05 ENCOUNTER — Other Ambulatory Visit: Payer: Self-pay

## 2019-06-05 ENCOUNTER — Ambulatory Visit
Admission: RE | Admit: 2019-06-05 | Discharge: 2019-06-05 | Disposition: A | Discharge status: Home / Self Care | Payer: BLUE CROSS/BLUE SHIELD | Source: Ambulatory Visit | Attending: Obstetrics and Gynecology | Admitting: Obstetrics and Gynecology

## 2019-06-05 DIAGNOSIS — N6310 Unspecified lump in the right breast, unspecified quadrant: Secondary | ICD-10-CM | POA: Diagnosis not present

## 2019-06-05 DIAGNOSIS — R922 Inconclusive mammogram: Secondary | ICD-10-CM | POA: Diagnosis not present

## 2019-07-08 DIAGNOSIS — Z20828 Contact with and (suspected) exposure to other viral communicable diseases: Secondary | ICD-10-CM | POA: Diagnosis not present

## 2019-08-17 DIAGNOSIS — N979 Female infertility, unspecified: Secondary | ICD-10-CM | POA: Diagnosis not present

## 2019-08-17 DIAGNOSIS — Z3183 Encounter for assisted reproductive fertility procedure cycle: Secondary | ICD-10-CM | POA: Diagnosis not present

## 2019-08-17 DIAGNOSIS — N83292 Other ovarian cyst, left side: Secondary | ICD-10-CM | POA: Diagnosis not present

## 2019-08-17 DIAGNOSIS — E2839 Other primary ovarian failure: Secondary | ICD-10-CM | POA: Diagnosis not present

## 2019-08-30 ENCOUNTER — Other Ambulatory Visit: Payer: Self-pay | Admitting: Plastic Surgery

## 2019-08-30 DIAGNOSIS — N63 Unspecified lump in unspecified breast: Secondary | ICD-10-CM

## 2019-08-30 DIAGNOSIS — N644 Mastodynia: Secondary | ICD-10-CM

## 2019-09-03 ENCOUNTER — Other Ambulatory Visit: Payer: Self-pay | Admitting: Plastic Surgery

## 2019-09-03 ENCOUNTER — Other Ambulatory Visit: Payer: Self-pay | Admitting: Obstetrics and Gynecology

## 2019-09-05 ENCOUNTER — Other Ambulatory Visit: Payer: Self-pay

## 2019-09-05 ENCOUNTER — Ambulatory Visit
Admission: RE | Admit: 2019-09-05 | Discharge: 2019-09-05 | Disposition: A | Discharge status: Home / Self Care | Payer: BC Managed Care – PPO | Source: Ambulatory Visit | Attending: Plastic Surgery | Admitting: Plastic Surgery

## 2019-09-05 DIAGNOSIS — N644 Mastodynia: Secondary | ICD-10-CM

## 2019-09-05 DIAGNOSIS — N63 Unspecified lump in unspecified breast: Secondary | ICD-10-CM

## 2019-09-05 DIAGNOSIS — N6314 Unspecified lump in the right breast, lower inner quadrant: Secondary | ICD-10-CM | POA: Diagnosis not present

## 2019-09-05 MED ORDER — GADOBUTROL 1 MMOL/ML IV SOLN
5.0000 mL | Freq: Once | INTRAVENOUS | Status: AC | PRN
  Administered 2019-09-05: 5 mL via INTRAVENOUS

## 2019-09-10 DIAGNOSIS — N979 Female infertility, unspecified: Secondary | ICD-10-CM | POA: Diagnosis not present

## 2019-09-10 DIAGNOSIS — E2839 Other primary ovarian failure: Secondary | ICD-10-CM | POA: Diagnosis not present

## 2019-09-10 DIAGNOSIS — Z3183 Encounter for assisted reproductive fertility procedure cycle: Secondary | ICD-10-CM | POA: Diagnosis not present

## 2019-09-12 ENCOUNTER — Other Ambulatory Visit: Payer: Self-pay | Admitting: Plastic Surgery

## 2019-09-12 DIAGNOSIS — N6310 Unspecified lump in the right breast, unspecified quadrant: Secondary | ICD-10-CM

## 2019-09-17 ENCOUNTER — Encounter: Payer: Self-pay | Admitting: Hematology and Oncology

## 2019-09-17 ENCOUNTER — Ambulatory Visit
Admission: RE | Admit: 2019-09-17 | Discharge: 2019-09-17 | Disposition: A | Discharge status: Home / Self Care | Payer: BC Managed Care – PPO | Source: Ambulatory Visit | Attending: Plastic Surgery | Admitting: Plastic Surgery

## 2019-09-17 ENCOUNTER — Other Ambulatory Visit: Payer: Self-pay

## 2019-09-17 ENCOUNTER — Other Ambulatory Visit: Payer: Self-pay | Admitting: Plastic Surgery

## 2019-09-17 DIAGNOSIS — R599 Enlarged lymph nodes, unspecified: Secondary | ICD-10-CM

## 2019-09-17 DIAGNOSIS — C50311 Malignant neoplasm of lower-inner quadrant of right female breast: Secondary | ICD-10-CM | POA: Diagnosis not present

## 2019-09-17 DIAGNOSIS — Z3141 Encounter for fertility testing: Secondary | ICD-10-CM | POA: Diagnosis not present

## 2019-09-17 DIAGNOSIS — N6310 Unspecified lump in the right breast, unspecified quadrant: Secondary | ICD-10-CM

## 2019-09-17 DIAGNOSIS — N8500 Endometrial hyperplasia, unspecified: Secondary | ICD-10-CM | POA: Diagnosis not present

## 2019-09-17 DIAGNOSIS — N6314 Unspecified lump in the right breast, lower inner quadrant: Secondary | ICD-10-CM | POA: Diagnosis not present

## 2019-09-18 ENCOUNTER — Telehealth: Payer: Self-pay | Admitting: Hematology and Oncology

## 2019-09-18 NOTE — Telephone Encounter (Signed)
Spoke to patient to confirm morning [facility] appointment for 10/14, packet emailed to patient

## 2019-09-19 ENCOUNTER — Encounter: Payer: Self-pay | Admitting: *Deleted

## 2019-09-19 DIAGNOSIS — Z170 Estrogen receptor positive status [ER+]: Secondary | ICD-10-CM

## 2019-09-19 DIAGNOSIS — C50311 Malignant neoplasm of lower-inner quadrant of right female breast: Secondary | ICD-10-CM | POA: Insufficient documentation

## 2019-09-21 ENCOUNTER — Other Ambulatory Visit: Payer: Self-pay

## 2019-09-21 ENCOUNTER — Ambulatory Visit (INDEPENDENT_AMBULATORY_CARE_PROVIDER_SITE_OTHER): Payer: BC Managed Care – PPO | Admitting: *Deleted

## 2019-09-21 DIAGNOSIS — Z23 Encounter for immunization: Secondary | ICD-10-CM | POA: Diagnosis not present

## 2019-09-21 NOTE — Progress Notes (Signed)
Per orders of Dr. Maudie Mercury  injection of Influenza Vaccine given by Zacarias Pontes. Patient tolerated injection well.

## 2019-09-25 NOTE — Progress Notes (Signed)
[facility] NOTE  Patient Care Team: [place], Betty G, MD as PCP - General (Family Medicine) Mauro Kaufmann, RN as Oncology Nurse Navigator Rockwell Germany, RN as Oncology Nurse Navigator Erroll Luna, MD as Consulting Physician (General Surgery) Nicholas Lose, MD as Consulting Physician (Hematology and Oncology) Gery Pray, MD as Consulting Physician (Radiation Oncology)  CHIEF COMPLAINTS/PURPOSE OF CONSULTATION:  Newly diagnosed breast cancer  HISTORY OF PRESENTING ILLNESS:  Tabitha Brown 38 y.o. female is here because of recent diagnosis of invasive ductal carcinoma of the right breast. The patient palpated a right breast mass. Diagnostic mammogram on 06/05/19 showed a probably benign right breast mass at the 4 o'clock position measuring 0.5cm. Short-term follow-up was recommended. Breast MRI on 09/05/19 showed a right breast mass at the 4 o'clock position with benign morphology. Biopsy on 09/17/19 showed invasive ductal carcinoma, grade 3, HER-2 negative by FISH, ER+ 95%, PR+ 95%, Ki67 10%. At the time the patient initially palpated the right breast mass she was undergoing hormonal treatment for planned in-vitro fertilization. She has no family history of breast cancer. She has a history of breast implants placed in 2010. She presents to the clinic today for initial evaluation and discussion of treatment options.   I reviewed her records extensively and collaborated the history with the patient.  SUMMARY OF ONCOLOGIC HISTORY: Oncology History  Malignant neoplasm of lower-inner quadrant of right breast of female, estrogen receptor positive ([place])  09/19/2019 Initial Diagnosis   Patient palpated a right breast mass. Mammogram on 06/05/19 showed a probably benign right breast mass at the 4 o'clock position measuring 0.5cm. Breast MRI on 09/05/19 showed a right breast mass at the 4 o'clock position with benign morphology. Biopsy showed IDC, grade 3, HER-2 - by FISH,  ER+ 95%, PR+ 95%, Ki67 10%.     09/26/2019 Cancer Staging   Staging form: Breast, AJCC 8th Edition - Clinical stage from 09/26/2019: Stage IA (cT1b, cN0, cM0, G3, ER+, PR+, HER2-) - Signed by Nicholas Lose, MD on 09/26/2019     MEDICAL HISTORY:  Past Medical History:  Diagnosis Date   Breast mass 07/26/2017   Right breast lump x 1 month    Eating disorder    UTI (urinary tract infection)     SURGICAL HISTORY: Past Surgical History:  Procedure Laterality Date   APPENDECTOMY     AUGMENTATION MAMMAPLASTY Bilateral 2010   BREAST AUGMENTATION  2010   CESAREAN SECTION      SOCIAL HISTORY: Social History   Socioeconomic History   Marital status: Married    Spouse name: Not on file   Number of children: Not on file   Years of education: Not on file   Highest education level: Not on file  Occupational History   Not on file  Social Needs   Financial resource strain: Not on file   Food insecurity    Worry: Not on file    Inability: Not on file   Transportation needs    Medical: Not on file    Non-medical: Not on file  Tobacco Use   Smoking status: Never Smoker   Smokeless tobacco: Never Used  Substance and Sexual Activity   Alcohol use: Yes    Comment: 1-2   Drug use: No   Sexual activity: Yes  Lifestyle   Physical activity    Days per week: Not on file    Minutes per session: Not on file   Stress: Not on file  Relationships  Social Herbalist on phone: Not on file    Gets together: Not on file    Attends religious service: Not on file    Active member of club or organization: Not on file    Attends meetings of clubs or organizations: Not on file    Relationship status: Not on file   Intimate partner violence    Fear of current or ex partner: Not on file    Emotionally abused: Not on file    Physically abused: Not on file    Forced sexual activity: Not on file  Other Topics Concern   Not on file  Social History  Narrative   Not on file    FAMILY HISTORY: Family History  Problem Relation Age of Onset   Alcohol abuse Maternal Grandfather        bile duct cancer   Heart disease Maternal Grandfather    Hypertension Paternal Grandmother     ALLERGIES:  is allergic to sulfa antibiotics and sulfonylureas.  MEDICATIONS:  Current Outpatient Medications  Medication Sig Dispense Refill   Ascorbic Acid (VITAMIN C PO) Take by mouth daily.     cyclobenzaprine (FLEXERIL) 10 MG tablet Take 0.5-1 tablets (5-10 mg total) by mouth at bedtime. 30 tablet 1   Multiple Vitamins-Minerals (ZINC PO) Take by mouth daily.     Prenatal Vit-Fe Fumarate-FA (PRENATAL MULTIVITAMIN) TABS tablet Take 1 tablet by mouth daily at 12 noon.     PROBIOTIC PRODUCT PO Take by mouth daily.     VITAMIN D, ERGOCALCIFEROL, PO Take by mouth daily.     Coenzyme Q10 (CO Q10 PO) Take by mouth daily.     levothyroxine (SYNTHROID, LEVOTHROID) 25 MCG tablet Take 25 mcg by mouth daily before breakfast.     No current facility-administered medications for this visit.     REVIEW OF SYSTEMS:   Constitutional: Denies fevers, chills or abnormal night sweats Eyes: Denies blurriness of vision, double vision or watery eyes Ears, nose, mouth, throat, and face: Denies mucositis or sore throat Respiratory: Denies cough, dyspnea or wheezes Cardiovascular: Denies palpitation, chest discomfort or lower extremity swelling Gastrointestinal:  Denies nausea, heartburn or change in bowel habits Skin: Denies abnormal skin rashes Lymphatics: Denies new lymphadenopathy or easy bruising Neurological:Denies numbness, tingling or new weaknesses Behavioral/Psych: Mood is stable, no new changes  Breast: Palpable right breast mass All other systems were reviewed with the patient and are negative.  PHYSICAL EXAMINATION: ECOG PERFORMANCE STATUS: 0 - Asymptomatic  Vitals:   09/26/19 0915  BP: 120/74  Pulse: (!) 103  Resp: 20  Temp: 98.1 F (36.7  C)  SpO2: 100%   Filed Weights   09/26/19 0915  Weight: 103 lb 1.6 oz (46.8 kg)    GENERAL:alert, no distress and comfortable SKIN: skin color, texture, turgor are normal, no rashes or significant lesions EYES: normal, conjunctiva are pink and non-injected, sclera clear OROPHARYNX:no exudate, no erythema and lips, buccal mucosa, and tongue normal  NECK: supple, thyroid normal size, non-tender, without nodularity LYMPH:  no palpable lymphadenopathy in the cervical, axillary or inguinal LUNGS: clear to auscultation and percussion with normal breathing effort HEART: regular rate & rhythm and no murmurs and no lower extremity edema ABDOMEN:abdomen soft, non-tender and normal bowel sounds Musculoskeletal:no cyanosis of digits and no clubbing  PSYCH: alert & oriented x 3 with fluent speech NEURO: no focal motor/sensory deficits BREAST: No palpable nodules in breast. No palpable axillary or supraclavicular lymphadenopathy (exam performed in the presence of  a chaperone)   LABORATORY DATA:  I have reviewed the data as listed Lab Results  Component Value Date   WBC 5.5 09/26/2019   HGB 14.0 09/26/2019   HCT 40.5 09/26/2019   MCV 88.6 09/26/2019   PLT 222 09/26/2019   Lab Results  Component Value Date   NA 142 09/26/2019   K 3.8 09/26/2019   CL 105 09/26/2019   CO2 27 09/26/2019    RADIOGRAPHIC STUDIES: I have personally reviewed the radiological reports and agreed with the findings in the report.  ASSESSMENT AND PLAN:  Malignant neoplasm of lower-inner quadrant of right breast of female, estrogen receptor positive ([place]) 09/19/19: Patient palpated a right breast mass. Mammogram on 06/05/19 showed a probably benign right breast mass at the 4 o'clock position measuring 0.5cm. Breast MRI on 09/05/19 showed a right breast mass at the 4 o'clock position with benign morphology. Biopsy showed IDC, grade 3, HER-2 - by FISH, ER+ 95%, PR+ 95%, Ki67 10%.  Pathology and radiology  counseling:Discussed with the patient, the details of pathology including the type of breast cancer,the clinical staging, the significance of ER, PR and HER-2/neu receptors and the implications for treatment. After reviewing the pathology in detail, we proceeded to discuss the different treatment options between surgery, radiation, chemotherapy, antiestrogen therapies.  Recommendations: 1. Breast conserving surgery followed by 2. Oncotype DX testing to determine if chemotherapy would be of any benefit followed by 3. Adjuvant radiation therapy followed by 4. Adjuvant antiestrogen therapy 5. Genetics  If genetics show BRCA mutation, she wants to do Bil mastectomies  Oncotype counseling: I discussed Oncotype DX test. I explained to the patient that this is a 21 gene panel to evaluate patient tumors DNA to calculate recurrence score. This would help determine whether patient has high risk or intermediate risk or low risk breast cancer. She understands that if her tumor was found to be high risk, she would benefit from systemic chemotherapy. If low risk, no need of chemotherapy. If she was found to be intermediate risk, we would need to evaluate the score as well as other risk factors and determine if an abbreviated chemotherapy may be of benefit.  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   All questions were answered. The patient knows to call the clinic with any problems, questions or concerns.   Rulon Eisenmenger, MD, MPH 09/26/2019    I, Molly Dorshimer, am acting as scribe for Nicholas Lose, MD.  I have reviewed the above documentation for accuracy and completeness, and I agree with the above.

## 2019-09-26 ENCOUNTER — Ambulatory Visit (HOSPITAL_BASED_OUTPATIENT_CLINIC_OR_DEPARTMENT_OTHER): Payer: BC Managed Care – PPO | Admitting: Genetic Counselor

## 2019-09-26 ENCOUNTER — Inpatient Hospital Stay: Payer: BC Managed Care – PPO | Attending: Hematology and Oncology | Admitting: Hematology and Oncology

## 2019-09-26 ENCOUNTER — Inpatient Hospital Stay: Payer: BC Managed Care – PPO

## 2019-09-26 ENCOUNTER — Encounter: Payer: Self-pay | Admitting: Genetic Counselor

## 2019-09-26 ENCOUNTER — Other Ambulatory Visit: Payer: Self-pay

## 2019-09-26 ENCOUNTER — Encounter: Payer: Self-pay | Admitting: *Deleted

## 2019-09-26 ENCOUNTER — Ambulatory Visit: Payer: BC Managed Care – PPO | Attending: Surgery | Admitting: Physical Therapy

## 2019-09-26 ENCOUNTER — Ambulatory Visit
Admission: RE | Admit: 2019-09-26 | Discharge: 2019-09-26 | Disposition: A | Discharge status: Home / Self Care | Payer: BC Managed Care – PPO | Source: Ambulatory Visit | Attending: Radiation Oncology | Admitting: Radiation Oncology

## 2019-09-26 ENCOUNTER — Encounter: Payer: Self-pay | Admitting: Hematology and Oncology

## 2019-09-26 ENCOUNTER — Encounter: Payer: Self-pay | Admitting: Physical Therapy

## 2019-09-26 DIAGNOSIS — R293 Abnormal posture: Secondary | ICD-10-CM | POA: Diagnosis not present

## 2019-09-26 DIAGNOSIS — Z811 Family history of alcohol abuse and dependence: Secondary | ICD-10-CM

## 2019-09-26 DIAGNOSIS — C50311 Malignant neoplasm of lower-inner quadrant of right female breast: Secondary | ICD-10-CM

## 2019-09-26 DIAGNOSIS — Z170 Estrogen receptor positive status [ER+]: Secondary | ICD-10-CM | POA: Diagnosis not present

## 2019-09-26 DIAGNOSIS — Z882 Allergy status to sulfonamides status: Secondary | ICD-10-CM | POA: Diagnosis not present

## 2019-09-26 DIAGNOSIS — C50911 Malignant neoplasm of unspecified site of right female breast: Secondary | ICD-10-CM | POA: Diagnosis not present

## 2019-09-26 DIAGNOSIS — Z8249 Family history of ischemic heart disease and other diseases of the circulatory system: Secondary | ICD-10-CM

## 2019-09-26 DIAGNOSIS — Z800 Family history of malignant neoplasm of digestive organs: Secondary | ICD-10-CM | POA: Insufficient documentation

## 2019-09-26 DIAGNOSIS — Z79899 Other long term (current) drug therapy: Secondary | ICD-10-CM

## 2019-09-26 LAB — CMP (CANCER CENTER ONLY)
ALT: 19 U/L (ref 0–44)
AST: 19 U/L (ref 15–41)
Albumin: 4.2 g/dL (ref 3.5–5.0)
Alkaline Phosphatase: 59 U/L (ref 38–126)
Anion gap: 10 (ref 5–15)
BUN: 9 mg/dL (ref 6–20)
CO2: 27 mmol/L (ref 22–32)
Calcium: 9.2 mg/dL (ref 8.9–10.3)
Chloride: 105 mmol/L (ref 98–111)
Creatinine: 0.86 mg/dL (ref 0.44–1.00)
GFR, Est AFR Am: >60 mL/min (ref >60)
GFR, Estimated: >60 mL/min (ref >60)
Glucose, Bld: 91 mg/dL (ref 70–99)
Potassium: 3.8 mmol/L (ref 3.5–5.1)
Sodium: 142 mmol/L (ref 135–145)
Total Bilirubin: 0.3 mg/dL (ref 0.3–1.2)
Total Protein: 7.1 g/dL (ref 6.5–8.1)

## 2019-09-26 LAB — CBC WITH DIFFERENTIAL (CANCER CENTER ONLY)
Abs Immature Granulocytes: 0.01 10*3/uL (ref 0.00–0.07)
Basophils Absolute: 0 10*3/uL (ref 0.0–0.1)
Basophils Relative: 1 %
Eosinophils Absolute: 0.2 10*3/uL (ref 0.0–0.5)
Eosinophils Relative: 3 %
HCT: 40.5 % (ref 36.0–46.0)
Hemoglobin: 14 g/dL (ref 12.0–15.0)
Immature Granulocytes: 0 %
Lymphocytes Relative: 37 %
Lymphs Abs: 2.1 10*3/uL (ref 0.7–4.0)
MCH: 30.6 pg (ref 26.0–34.0)
MCHC: 34.6 g/dL (ref 30.0–36.0)
MCV: 88.6 fL (ref 80.0–100.0)
Monocytes Absolute: 0.5 10*3/uL (ref 0.1–1.0)
Monocytes Relative: 9 %
Neutro Abs: 2.8 10*3/uL (ref 1.7–7.7)
Neutrophils Relative %: 50 %
Platelet Count: 222 10*3/uL (ref 150–400)
RBC: 4.57 MIL/uL (ref 3.87–5.11)
RDW: 11.4 % — ABNORMAL LOW (ref 11.5–15.5)
WBC Count: 5.5 10*3/uL (ref 4.0–10.5)
nRBC: 0 % (ref 0.0–0.2)

## 2019-09-26 NOTE — Progress Notes (Signed)
Radiation Oncology         [phone] ________________________________  Multidisciplinary Breast Oncology Clinic [facility]) Initial Outpatient Consultation  Name: Tabitha Brown MRN: [id number]  Date: 09/26/2019  DOB: Nov 24, 1981  HK:FEXMDY, Malka So, MD  Erroll Luna, MD   REFERRING PHYSICIAN: Erroll Luna, MD  DIAGNOSIS: The encounter diagnosis was Malignant neoplasm of lower-inner quadrant of right breast of female, estrogen receptor positive ([place]).  Clinical Stage IA, T1b Right Breast LIQ, Invasive Ductal Carcinoma, ER+ / PR+ / Her2-, Grade 3    ICD-10-CM   1. Malignant neoplasm of lower-inner quadrant of right breast of female, estrogen receptor positive ([place])  C50.311    Z17.0     HISTORY OF PRESENT ILLNESS::Tabitha Brown is a 38 y.o. female who is presenting to the office today for evaluation of her newly diagnosed breast cancer. She is accompanied by her husband via telephone. She is doing well overall.   She presented with a small palpable abnormality in the right breast for about a month. She underwent bilateral diagnostic mammography with tomography and right breast ultrasonography at [facility] on 06/05/2019 showing: probably-benign mass in the right breast at 4 o'clock. 3 month follow up was recommended.  She continued to have pain in the right breast region and wished to have her implants evaluated.  She returned for bilateral breast MRI on 09/05/2019, which showed: though the right breast mass at 4 o'clock has benign morphology, the washout kinetics would be unusual for a fibroadenoma; no evidence of malignancy in the left breast or bilateral lymph nodes.  Biopsy on 09/17/2019 showed: invasive ductal carcinoma, grade 3. Prognostic indicators significant for: estrogen receptor, 95% positive and progesterone receptor, 95% positive, both with strong staining intensity. Proliferation marker Ki67 at 10%. HER2 negative by FISH.  Menarche: 38 years old Age at  first live birth: 38 years old GP: 2 LMP: unsure, currently having regular periods Contraceptive: yes, used for approximately 4 years HRT: n/a   The patient was referred today for presentation in the multidisciplinary conference.  Radiology studies and pathology slides were presented there for review and discussion of treatment options.  A consensus was discussed regarding potential next steps.  PREVIOUS RADIATION THERAPY: No  PAST MEDICAL HISTORY:  Past Medical History:  Diagnosis Date   Breast mass 07/26/2017   Right breast lump x 1 month    Eating disorder    Family history of cholangiocarcinoma    UTI (urinary tract infection)     PAST SURGICAL HISTORY: Past Surgical History:  Procedure Laterality Date   APPENDECTOMY     AUGMENTATION MAMMAPLASTY Bilateral 2010   BREAST AUGMENTATION  2010   CESAREAN SECTION      FAMILY HISTORY:  Family History  Problem Relation Age of Onset   Alcohol abuse Maternal Grandfather        bile duct cancer   Heart disease Maternal Grandfather    Hypertension Paternal Grandmother     SOCIAL HISTORY:  Social History   Socioeconomic History   Marital status: Married    Spouse name: Not on file   Number of children: Not on file   Years of education: Not on file   Highest education level: Not on file  Occupational History   Not on file  Social Needs   Financial resource strain: Not on file   Food insecurity    Worry: Not on file    Inability: Not on file   Transportation needs    Medical: Not on  file    Non-medical: Not on file  Tobacco Use   Smoking status: Never Smoker   Smokeless tobacco: Never Used  Substance and Sexual Activity   Alcohol use: Yes    Comment: 1-2   Drug use: No   Sexual activity: Yes  Lifestyle   Physical activity    Days per week: Not on file    Minutes per session: Not on file   Stress: Not on file  Relationships   Social connections    Talks on phone: Not on file     Gets together: Not on file    Attends religious service: Not on file    Active member of club or organization: Not on file    Attends meetings of clubs or organizations: Not on file    Relationship status: Not on file  Other Topics Concern   Not on file  Social History Narrative   Not on file    ALLERGIES:  Allergies  Allergen Reactions   Sulfa Antibiotics Hives   Sulfonylureas     MEDICATIONS:  Current Outpatient Medications  Medication Sig Dispense Refill   Ascorbic Acid (VITAMIN C PO) Take by mouth daily.     Coenzyme Q10 (CO Q10 PO) Take by mouth daily.     cyclobenzaprine (FLEXERIL) 10 MG tablet Take 0.5-1 tablets (5-10 mg total) by mouth at bedtime. 30 tablet 1   levothyroxine (SYNTHROID, LEVOTHROID) 25 MCG tablet Take 25 mcg by mouth daily before breakfast.     Multiple Vitamins-Minerals (ZINC PO) Take by mouth daily.     Prenatal Vit-Fe Fumarate-FA (PRENATAL MULTIVITAMIN) TABS tablet Take 1 tablet by mouth daily at 12 noon.     PROBIOTIC PRODUCT PO Take by mouth daily.     VITAMIN D, ERGOCALCIFEROL, PO Take by mouth daily.     No current facility-administered medications for this encounter.     REVIEW OF SYSTEMS: A 10+ POINT REVIEW OF SYSTEMS WAS OBTAINED including neurology, dermatology, psychiatry, cardiac, respiratory, lymph, extremities, GI, GU, musculoskeletal, constitutional, reproductive, HEENT. On the provided form, she reports night sweats, burning pain to her breast, palpable breast lump, wearing contacts, and anxiety and depression regarding her diagnosis. She notes diffuse breast pain is mainly around the nipple. She denies weight change, cough, shortness of breath, nausea or vomiting, and any other symptoms.    PHYSICAL EXAM:  Vitals with BMI 09/26/2019  Height 5' 1"   Weight 103 lbs 2 oz  BMI 12.87  Systolic 867  Diastolic 74  Pulse 672  Lungs are clear to auscultation bilaterally. Heart has regular rate and rhythm. No palpable cervical,  supraclavicular, or axillary adenopathy. Abdomen soft, non-tender, normal bowel sounds. Breast: left breast with cosmetic implant in place, no palpable mass, nipple discharge, or bleeding. Right breast with bruising in the lower-inner quadrant, small palpable nodule approximately 5 mm in size, no nipple discharge or bleeding.  ECOG = 1  0 - Asymptomatic (Fully active, able to carry on all predisease activities without restriction)  1 - Symptomatic but completely ambulatory (Restricted in physically strenuous activity but ambulatory and able to carry out work of a light or sedentary nature. For example, light housework, office work)  2 - Symptomatic, <50% in bed during the day (Ambulatory and capable of all self care but unable to carry out any work activities. Up and about more than 50% of waking hours)  3 - Symptomatic, >50% in bed, but not bedbound (Capable of only limited self-care, confined to bed or chair 50%  or more of waking hours)  4 - Bedbound (Completely disabled. Cannot carry on any self-care. Totally confined to bed or chair)  5 - Death   Eustace Pen MM, Creech RH, Tormey DC, et al. [phone]). "Toxicity and response criteria of the [employer]". [place] Oncol. 5 (6): 649-55  LABORATORY DATA:  Lab Results  Component Value Date   WBC 5.5 09/26/2019   HGB 14.0 09/26/2019   HCT 40.5 09/26/2019   MCV 88.6 09/26/2019   PLT 222 09/26/2019   Lab Results  Component Value Date   NA 142 09/26/2019   K 3.8 09/26/2019   CL 105 09/26/2019   CO2 27 09/26/2019   Lab Results  Component Value Date   ALT 19 09/26/2019   AST 19 09/26/2019   ALKPHOS 59 09/26/2019   BILITOT 0.3 09/26/2019    PULMONARY FUNCTION TEST:   Recent Review Flowsheet Data    There is no flowsheet data to display.      RADIOGRAPHY: Mr Breast Bilateral W [place] Cad  Result Date: 09/06/2019 CLINICAL DATA:  38 year old female who presented in June of 2020 with a palpable lump in  the medial right breast. Diagnostic workup demonstrated a likely benign mass at 4 o'clock, 3 cm from the nipple which measured 5 mm. No family history of breast cancer. She has bilateral silicone implants which were placed in 2010. LABS:  None. EXAM: BILATERAL BREAST MRI WITH AND WITHOUT CONTRAST TECHNIQUE: Multiplanar, multisequence MR images of both breasts were obtained prior to and following the intravenous administration of 5 ml of Gadavist Three-dimensional MR images were rendered by post-processing of the original MR data on an independent workstation. The three-dimensional MR images were interpreted, and findings are reported in the following complete MRI report for this study. Three dimensional images were evaluated at the independent DynaCad workstation COMPARISON:  No prior MRI available for comparison. Correlation made with prior mammograms and ultrasound. FINDINGS: Breast composition: d. Extreme fibroglandular tissue. Background parenchymal enhancement: Mild. Right breast: There is a right retropectoral implant. No evidence of extracapsular silicone. In the lower-inner quadrant of the right breast there is a circumscribed oval enhancing mass measuring 6 x 4 x 5 mm. This corresponds in size and location with the palpable mass identified by ultrasound. There is probable associated bright T2 signal, though due to the small size it is difficult to be certain. However, there is prominent associated washout kinetics which would be unusual for a fibroadenoma. Left breast: There is a left retropectoral implant. No evidence of extracapsular silicone. No mass or abnormal enhancement. Lymph nodes: No abnormal appearing lymph nodes. Ancillary findings:  None. IMPRESSION: 1. Though the right breast mass at 4 o'clock has benign morphology, the washout kinetics would be unusual for a fibroadenoma. Further evaluation with biopsy is recommended. 2.  No evidence of malignancy in the left breast. RECOMMENDATION: 1.  Ultrasound-guided biopsy is recommended for the right breast mass at 4 o'clock. Ultrasound of the right axilla is recommended at the time of biopsy. BI-RADS CATEGORY  4: Suspicious. Electronically Signed   By: Ammie Ferrier M.D.   On: 09/06/2019 08:56   [place] Axilla Right  Result Date: 09/18/2019 CLINICAL DATA:  38 year old female with a suspicious mass in the right breast, here for evaluation of the right axilla prior to ultrasound-guided right breast biopsy. EXAM: ULTRASOUND OF THE RIGHT AXILLA COMPARISON:  MR dated 09/05/2019 FINDINGS: Targeted right axillary ultrasound was performed by the physician. Normal-appearing right axillary lymph nodes  are identified. No abnormal appearing lymph nodes are identified. No suspicious solid or cystic mass is identified. IMPRESSION: No abnormal appearing right axillary lymph nodes are identified. RECOMMENDATION: Recommend ultrasound-guided right breast biopsy which will be performed later today. I have discussed the findings and recommendations with the patient. If applicable, a reminder letter will be sent to the patient regarding the next appointment. BI-RADS CATEGORY  1: Negative. Electronically Signed   By: Zerita Boers M.D.   On: 09/18/2019 09:18   Mm Clip Placement Right  Result Date: 09/17/2019 CLINICAL DATA:  Palpable hypoechoic mass in the right breast status post ultrasound-guided biopsy EXAM: DIAGNOSTIC RIGHT MAMMOGRAM POST ULTRASOUND BIOPSY COMPARISON:  Previous exam(s). FINDINGS: Mammographic images were obtained following ultrasound guided biopsy of a hypoechoic mass in the right breast at 4 o'clock 3 cm from the nipple. A heart shaped biopsy marking clip is appropriately positioned at the site of biopsy. The patient felt lightheaded while the first mammogram view was obtained and given the appropriate positioning of the biopsy marking clip on the tomosynthesis images and on the ultrasound during the procedure, a second orthogonal view was deferred.  IMPRESSION: Heart shaped biopsy marking clip appropriately positioned at the site of biopsy in the right breast. Final Assessment: Post Procedure Mammograms for Marker Placement Electronically Signed   By: Zerita Boers M.D.   On: 09/17/2019 18:12   [place] Rt Breast Bx W Loc Dev 1st Lesion Img Bx Spec US Guide  Addendum Date: 09/18/2019   ADDENDUM REPORT: 09/18/2019 11:54 ADDENDUM: Pathology revealed GRADE III INVASIVE DUCTAL CARCINOMA of the Right breast, 4 o'clock, 3cmfn. This was found to be concordant by Dr. Zerita Boers. Pathology results were discussed with the patient by telephone by Dr. Zerita Boers. The patient reported doing well after the biopsy with tenderness at the site. Post biopsy instructions and care were reviewed and questions were answered. The patient was encouraged to call [facility] for any additional concerns. The patient was referred to [facility] on September 26, 2019. Pathology results reported by Terie Purser, RN on 09/18/2019. Electronically Signed   By: Zerita Boers M.D.   On: 09/18/2019 11:54   Result Date: 09/18/2019 CLINICAL DATA:  Palpable mass in the right breast. Patient has a history of silicone implant augmentation. EXAM: ULTRASOUND GUIDED RIGHT BREAST CORE NEEDLE BIOPSY COMPARISON:  Previous exam(s). FINDINGS: I met with the patient and we discussed the procedure of ultrasound-guided biopsy, including benefits and alternatives. We discussed the high likelihood of a successful procedure. We discussed the risks of the procedure, including infection, bleeding, tissue injury, clip migration, inadequate sampling, and implant rupture. Informed written consent was given. The usual time-out protocol was performed immediately prior to the procedure. Lesion quadrant: Lower inner quadrant Using sterile technique and 1% Lidocaine as local anesthetic, under direct ultrasound  visualization, a 14 gauge spring-loaded device was used to perform biopsy of a hypoechoic mass at 4 o'clock 3 cm from the nipple using a medial approach. At the conclusion of the procedure a heart shaped tissue marker clip was deployed into the biopsy cavity. Follow up mammogram was performed and dictated separately. IMPRESSION: Ultrasound guided biopsy of a hypoechoic mass in the right breast. No apparent complications. Electronically Signed: By: Zerita Boers M.D. On: 09/17/2019 18:10      IMPRESSION: Stage IA, T1b,N0 Right Breast LIQ, Invasive Ductal Carcinoma, ER+ / PR+ / Her2-, Grade 3  Patient will  be a good candidate for breast conservation with radiotherapy to the right breast. We discussed the general course of radiation, potential side effects, and toxicities with radiation and the patient is interested in this approach.    PLAN:  1. Genetic testing 2. Right lumpectomy with sentinel lymph node biopsy 3. Oncotype, depending on tumor size 4. Adjuvant radiation therapy 5. Antiestrogen therapy   ------------------------------------------------  Blair Promise, PhD, MD  This document serves as a record of services personally performed by Gery Pray, MD. It was created on his behalf by Wilburn Mylar, a trained medical scribe. The creation of this record is based on the scribe's personal observations and the provider's statements to them. This document has been checked and approved by the attending provider.

## 2019-09-26 NOTE — Progress Notes (Signed)
Clinical Social Work [place] Psychosocial Distress Screening [place]  Patient completed distress screening protocol and scored a 8 on the Psychosocial Distress Thermometer which indicates moderate distress. Clinical Social Worker met with patient in [facility] to assess for distress and other psychosocial needs. Patient stated she was feeling overwhelmed but felt comfortable after meeting with the treatment team.  Patient did express feeling anxious to finalize her treatment plan. CSW and patient discussed common feeling and emotions when being diagnosed with cancer, and the importance of support during treatment.  Patient stated she had a positive support system in her family, but was open to exploring counseling/support options.  CSW informed patient of the support team and support services at [facility]. CSW provided contact information and encouraged patient to call with any questions or concerns.  ONCBCN DISTRESS SCREENING 09/26/2019  Screening Type Initial Screening  Distress experienced in past week (1-10) 8  Emotional problem type Depression;Nervousness/Anxiety;Adjusting to illness  Information Concerns Type Lack of info about diagnosis;Lack of info about treatment;Lack of info about maintaining fitness  Physical Problem type Sleep/insomnia  Physician notified of physical symptoms Yes     Johnnye Lana, MSW, LCSW, OSW-C Clinical Social Worker [place] [phone]

## 2019-09-26 NOTE — Assessment & Plan Note (Signed)
09/19/19: Patient palpated a right breast mass. Mammogram on 06/05/19 showed a probably benign right breast mass at the 4 o'clock position measuring 0.5cm. Breast MRI on 09/05/19 showed a right breast mass at the 4 o'clock position with benign morphology. Biopsy showed IDC, grade 3, HER-2 - by FISH, ER+ 95%, PR+ 95%, Ki67 10%.  Pathology and radiology counseling:Discussed with the patient, the details of pathology including the type of breast cancer,the clinical staging, the significance of ER, PR and HER-2/neu receptors and the implications for treatment. After reviewing the pathology in detail, we proceeded to discuss the different treatment options between surgery, radiation, chemotherapy, antiestrogen therapies.  Recommendations: 1. Breast conserving surgery followed by 2. Oncotype DX testing to determine if chemotherapy would be of any benefit followed by 3. Adjuvant radiation therapy followed by 4. Adjuvant antiestrogen therapy 5. Genetics  If genetics show BRCA mutation, she wants to do Bil mastectomies  Oncotype counseling: I discussed Oncotype DX test. I explained to the patient that this is a 21 gene panel to evaluate patient tumors DNA to calculate recurrence score. This would help determine whether patient has high risk or intermediate risk or low risk breast cancer. She understands that if her tumor was found to be high risk, she would benefit from systemic chemotherapy. If low risk, no need of chemotherapy. If she was found to be intermediate risk, we would need to evaluate the score as well as other risk factors and determine if an abbreviated chemotherapy may be of benefit.  Return to clinic after surgery to discuss final pathology report and then determine if Oncotype DX testing will need to be sent.

## 2019-09-26 NOTE — Patient Instructions (Signed)

## 2019-09-26 NOTE — Therapy (Addendum)
[place], [place], [address] Phone: [phone]   Fax:  [phone]  Physical Therapy Evaluation  Patient Details  Name: Tabitha Brown MRN: [id number] Date of Birth: 1981/01/20 Referring Provider (PT): Dr. Erroll Luna   Encounter Date: 09/26/2019  PT End of Session - 09/26/19 0940    Visit Number  1    Number of Visits  2    Date for PT Re-Evaluation  11/21/19    PT Start Time  1133    PT Stop Time  1155    PT Time Calculation (min)  22 min    Activity Tolerance  Patient tolerated treatment well    Behavior During Therapy  [facility] for tasks assessed/performed       Past Medical History:  Diagnosis Date  . Breast mass 07/26/2017   Right breast lump x 1 month   . Eating disorder   . Family history of cholangiocarcinoma   . UTI (urinary tract infection)     Past Surgical History:  Procedure Laterality Date  . APPENDECTOMY    . AUGMENTATION MAMMAPLASTY Bilateral 2010  . BREAST AUGMENTATION  2010  . CESAREAN SECTION      There were no vitals filed for this visit.   Subjective Assessment - 09/26/19 0928    Subjective  Patient reports she is here today to be seen by her medical team for her newly diagnosed right breast cancer.    Pertinent History  Patient was diagnosed on 09/05/2019 with right grade III invasive ductal carcinoma breast cancer. It measures 5 mm and is located in the lower inner quadrant. It is ER/PR positive and HER2 negative with a Ki67 of 10%. She has previous breast implants from 2010.    Patient Stated Goals  Reduce lymphedema risk and learn post op shoulder ROM HEP    Currently in Pain?  Yes    Pain Score  --   "mild"   Pain Location  Arm    Pain Orientation  Right    Pain Descriptors / Indicators  Numbness;Tingling    Pain Type  Neuropathic pain    Pain Onset  In the past 7 days    Pain Frequency  Intermittent    Aggravating Factors   stretching arm    Pain  Relieving Factors  unknown    Multiple Pain Sites  No         [facility] PT Assessment - 09/26/19 0001      Assessment   Medical Diagnosis  Right breast cancer    Referring Provider (PT)  Dr. Marcello Moores Cornett    Onset Date/Surgical Date  09/05/19    Hand Dominance  Right    Prior Therapy  none      Precautions   Precautions  Other (comment)    Precaution Comments  active cancer      Restrictions   Weight Bearing Restrictions  No      Balance Screen   Has the patient fallen in the past 6 months  No    Has the patient had a decrease in activity level because of a fear of falling?   No    Is the patient reluctant to leave their home because of a fear of falling?   No      Home Environment   Living Environment  Private residence    Living Arrangements  Spouse/significant other;Children   Husband, 3 and 35 y.o. kids   Available Help at Discharge  Family      Prior Function   Level of Independence  Independent    Vocation  Full time employment    Archivist    Leisure  She runs 30-35 miles/week, daily power yoga, and weight training daily      Cognition   Overall Cognitive Status  Within Functional Limits for tasks assessed      Posture/Postural Control   Posture/Postural Control  Postural limitations    Postural Limitations  Rounded Shoulders      ROM / Strength   AROM / PROM / Strength  AROM;Strength      AROM   AROM Assessment Site  Shoulder    Right/Left Shoulder  Right;Left    Right Shoulder Extension  47 Degrees    Right Shoulder Flexion  147 Degrees    Right Shoulder ABduction  160 Degrees    Right Shoulder Internal Rotation  63 Degrees    Right Shoulder External Rotation  77 Degrees    Left Shoulder Extension  46 Degrees    Left Shoulder Flexion  150 Degrees    Left Shoulder ABduction  159 Degrees    Left Shoulder Internal Rotation  77 Degrees    Left Shoulder External Rotation  85 Degrees      Strength   Overall Strength  Within  functional limits for tasks performed        LYMPHEDEMA/ONCOLOGY QUESTIONNAIRE - 09/26/19 0939      Type   Cancer Type  Right breast cancer      Lymphedema Assessments   Lymphedema Assessments  Upper extremities      Right Upper Extremity Lymphedema   10 cm Proximal to Olecranon Process  21.9 cm    Olecranon Process  21.6 cm    10 cm Proximal to Ulnar Styloid Process  18.8 cm    Just Proximal to Ulnar Styloid Process  13.6 cm    Across Hand at [employer]  17.9 cm    At [place] of 2nd Digit  5.8 cm      Left Upper Extremity Lymphedema   10 cm Proximal to Olecranon Process  21.8 cm    Olecranon Process  20.8 cm    10 cm Proximal to Ulnar Styloid Process  19.2 cm    Just Proximal to Ulnar Styloid Process  13.5 cm    Across Hand at [employer]  17.7 cm    At [place] of 2nd Digit  5.4 cm          Quick Dash - 09/26/19 0001    Open a tight or new jar  No difficulty    Do heavy household chores (wash walls, wash floors)  No difficulty    Carry a shopping bag or briefcase  No difficulty    Wash your back  No difficulty    Use a knife to cut food  No difficulty    Recreational activities in which you take some force or impact through your arm, shoulder, or hand (golf, hammering, tennis)  No difficulty    During the past week, to what extent has your arm, shoulder or hand problem interfered with your normal social activities with family, friends, neighbors, or groups?  Not at all    During the past week, to what extent has your arm, shoulder or hand problem limited your work or other regular daily activities  Not at all    Arm, shoulder, or hand pain.  Mild  Tingling (pins and needles) in your arm, shoulder, or hand  Mild    Difficulty Sleeping  No difficulty    DASH Score  4.55 %        Objective measurements completed on examination: See above findings.     Patient was instructed today in a home exercise program today for post op shoulder range of motion. These  included active assist shoulder flexion in sitting, scapular retraction, wall walking with shoulder abduction, and hands behind head external rotation.  She was encouraged to do these twice a day, holding 3 seconds and repeating 5 times when permitted by her physician.             PT Education - 09/26/19 0939    Education Details  Lymphedema risk reduction and post op shoulder ROM HEP    Person(s) Educated  Patient    Methods  Explanation;Demonstration;Handout    Comprehension  Returned demonstration;Verbalized understanding          PT Long Term Goals - 09/26/19 0942      PT LONG TERM GOAL #1   Title  Patient will demonstrate she has regained full shoulder ROM and function post operativley compared ot baselines.    Time  8    Period  Weeks    Status  New    Target Date  11/21/19      Breast Clinic Goals - 09/26/19 7062      Patient will be able to verbalize understanding of pertinent lymphedema risk reduction practices relevant to her diagnosis specifically related to skin care.   Time  1    Period  Days    Status  Achieved      Patient will be able to return demonstrate and/or verbalize understanding of the post-op home exercise program related to regaining shoulder range of motion.   Time  1    Period  Days    Status  Achieved      Patient will be able to verbalize understanding of the importance of attending the postoperative After Breast Cancer Class for further lymphedema risk reduction education and therapeutic exercise.   Time  1    Period  Days    Status  Achieved            Plan - 09/26/19 0940    Clinical Impression Statement  Patient was diagnosed on 09/05/2019 with right grade III invasive ductal carcinoma breast cancer. It measures 5 mm and is located in the lower inner quadrant. It is ER/PR positive and HER2 negative with a Ki67 of 10%. She has previous breast implants from 2010. Her multidisciplinary medical team met prior to her assessments  to determine a recommended treatment plan. She is planning to have a right lumpectomy and sentinel node biopsy followed by Oncotype testing if mass is > 5 mm, radiation, and anti-estrogen therapy. She will benefit from a post op PT reassessment to determine needs.    Stability/Clinical Decision Making  Stable/Uncomplicated    Clinical Decision Making  Low    Rehab Potential  Excellent    PT Frequency  --   Eval and 1 f/u visit   PT Treatment/Interventions  ADLs/Self Care Home Management;Therapeutic exercise;Patient/family education    PT Next Visit Plan  Will reassess 3-4 weeks post op to determine needs    PT Home Exercise Plan  Post op shoulder ROM HEP    Consulted and Agree with Plan of Care  Patient       Patient will  benefit from skilled therapeutic intervention in order to improve the following deficits and impairments:  Decreased knowledge of precautions, Postural dysfunction, Decreased range of motion, Impaired UE functional use, Pain  Visit Diagnosis: Malignant neoplasm of lower-inner quadrant of right breast of female, estrogen receptor positive ([place]) - Plan: PT plan of care cert/re-cert  Abnormal posture   Patient will follow up at outpatient cancer rehab 3-4 weeks following surgery.  If the patient requires physical therapy at that time, a specific plan will be dictated and sent to the referring physician for approval. The patient was educated today on appropriate basic range of motion exercises to begin post operatively and the importance of attending the After Breast Cancer class following surgery.  Patient was educated today on lymphedema risk reduction practices as it pertains to recommendations that will benefit the patient immediately following surgery.  She verbalized good understanding.      Problem List Patient Active Problem List   Diagnosis Date Noted  . Family history of cholangiocarcinoma   . Malignant neoplasm of lower-inner quadrant of right breast of female,  estrogen receptor positive ([place]) 09/19/2019   Annia Friendly, PT 09/26/19 9:25 PM  [place], [place], [address] Phone: [phone]   Fax:  [phone]  Name: Carlia Bomkamp MRN: [id number] Date of Birth: 1980/12/28

## 2019-09-26 NOTE — Progress Notes (Signed)
REFERRING PROVIDER: Nicholas Lose, MD [place],  [place] [address]  PRIMARY PROVIDER:  [place], Betty G, MD  PRIMARY REASON FOR VISIT:  1. Malignant neoplasm of lower-inner quadrant of right breast of female, estrogen receptor positive ([place])   2. Family history of cholangiocarcinoma    I connected with Tabitha Brown on 09/27/2019 at 12:30 pm EDT by Webex video conference and verified that I am speaking with the correct person using two identifiers.   Patient location: clinic Provider location: office  HISTORY OF PRESENT ILLNESS:   Tabitha Brown, a 38 y.o. female, was seen for a [facility] cancer genetics consultation at the request of Dr. Lindi Adie due to a personal history of breast cancer.  Tabitha Brown presents to clinic today to discuss the possibility of a hereditary predisposition to cancer, genetic testing, and to further clarify her future cancer risks, as well as potential cancer risks for family members.   In 2020, at the age of 38, Tabitha Brown was diagnosed with IDC, ER+/PR+/Her2-  of the right breast.   CANCER HISTORY:  Oncology History  Malignant neoplasm of lower-inner quadrant of right breast of female, estrogen receptor positive ([place])  09/19/2019 Initial Diagnosis   Patient palpated a right breast mass. Mammogram on 06/05/19 showed a probably benign right breast mass at the 4 o'clock position measuring 0.5cm. Breast MRI on 09/05/19 showed a right breast mass at the 4 o'clock position with benign morphology. Biopsy showed IDC, grade 3, HER-2 - by FISH, ER+ 95%, PR+ 95%, Ki67 10%.     09/26/2019 Cancer Staging   Staging form: Breast, AJCC 8th Edition - Clinical stage from 09/26/2019: Stage IA (cT1b, cN0, cM0, G3, ER+, PR+, HER2-) - Signed by Nicholas Lose, MD on 09/26/2019      RISK FACTORS:  Menarche was at age 5.  First live birth at age 26.  OCP use for approximately 5 years.  Ovaries intact: yes.  Hysterectomy: no.  Menopausal status:  premenopausal.  HRT use: 0 years. Colonoscopy: no. Mammogram within the last year: yes. Number of breast biopsies: 1.   Past Medical History:  Diagnosis Date  . Breast mass 07/26/2017   Right breast lump x 1 month   . Eating disorder   . Family history of cholangiocarcinoma   . UTI (urinary tract infection)     Past Surgical History:  Procedure Laterality Date  . APPENDECTOMY    . AUGMENTATION MAMMAPLASTY Bilateral 2010  . BREAST AUGMENTATION  2010  . CESAREAN SECTION      Social History   Socioeconomic History  . Marital status: Married    Spouse name: Not on file  . Number of children: Not on file  . Years of education: Not on file  . Highest education level: Not on file  Occupational History  . Not on file  Social Needs  . Financial resource strain: Not on file  . Food insecurity    Worry: Not on file    Inability: Not on file  . Transportation needs    Medical: Not on file    Non-medical: Not on file  Tobacco Use  . Smoking status: Never Smoker  . Smokeless tobacco: Never Used  Substance and Sexual Activity  . Alcohol use: Yes    Comment: 1-2  . Drug use: No  . Sexual activity: Yes  Lifestyle  . Physical activity    Days per week: Not on file    Minutes per session: Not on file  . Stress:  Not on file  Relationships  . Social Herbalist on phone: Not on file    Gets together: Not on file    Attends religious service: Not on file    Active member of club or organization: Not on file    Attends meetings of clubs or organizations: Not on file    Relationship status: Not on file  Other Topics Concern  . Not on file  Social History Narrative  . Not on file     FAMILY HISTORY:  We obtained a detailed, 4-generation family history.  Significant diagnoses are listed below: Family History  Problem Relation Age of Onset  . Alcohol abuse Maternal Grandfather        bile duct cancer  . Heart disease Maternal Grandfather   . Cancer Maternal  Grandfather 89       bile duct cancer  . Hypertension Paternal Grandmother    Tabitha Brown has two sons ages 39 and 63. She has two sisters and one brother, none of whom have had cancer.  Tabitha Brown mother is 39 and has not had cancer. She has two maternal aunts. Her maternal grandmother died at age 21, and her maternal grandfather died at age 96 with bile duct cancer. There are no other known diagnoses of cancer on her maternal side of the family.  Tabitha Brown father is 39 and has not had cancer. She has three paternal aunts and one paternal uncle. Her paternal grandmother died at age 64 and her paternal grandfather died at age 75. There are no know diagnoses of cancer on the paternal side of the family.  Tabitha Brown is unaware of previous family history of genetic testing for hereditary cancer risks. Patient's maternal ancestors are of [place], [place], and [place] descent, and paternal ancestors are of [place] and [place] descent.  Tabitha Brown had not heard that her paternal grandfather was specifically [place], but given that he was from [place] it is possible. There is no known consanguinity.  GENETIC COUNSELING ASSESSMENT: Tabitha Brown is a 38 y.o. female with a personal history of breast which is somewhat suggestive of a hereditary cancer syndrome and predisposition to cancer. We, therefore, discussed and recommended the following at today's visit.   DISCUSSION: We discussed that 5 - 10% of breast cancer is hereditary, with most cases associated with BRCA1/2.  There are other genes that can be associated with hereditary breast cancer syndromes.  These include ATM, CHEK2, PALB2, etc.  We discussed that testing is beneficial for several reasons including knowing about other cancer risks, identifying potential screening and risk-reduction options that may be appropriate, and to understand if other family members could be at risk for cancer and allow them to undergo genetic testing.   We  reviewed the characteristics, features and inheritance patterns of hereditary cancer syndromes. We also discussed genetic testing, including the appropriate family members to test, the process of testing, insurance coverage and turn-around-time for results. We discussed the implications of a negative, positive and/or variant of uncertain significant result. In order to get genetic test results in a timely manner so that Tabitha Brown can use these genetic test results for surgical decisions, we recommended Tabitha Brown pursue genetic testing for the [facility] Breast Cancer STAT panel. Once complete, we recommend Tabitha Brown pursue reflex genetic testing to the Common Hereditary Cancers panel.   The STAT Breast cancer panel offered by [facility] includes sequencing and rearrangement analysis for the following 9 genes:  ATM, BRCA1,  BRCA2, CDH1, CHEK2, PALB2, PTEN, STK11 and TP53.  The Common Hereditary Cancers Panel offered by [facility] includes sequencing and/or deletion duplication testing of the following 48 genes: APC, ATM, AXIN2, BARD1, BMPR1A, BRCA1, BRCA2, BRIP1, CDH1, CDK4, CDKN2A (p14ARF), CDKN2A (p16INK4a), CHEK2, CTNNA1, DICER1, EPCAM (Deletion/duplication testing only), GREM1 (promoter region deletion/duplication testing only), KIT, MEN1, MLH1, MSH2, MSH3, MSH6, MUTYH, NBN, NF1, NHTL1, PALB2, PDGFRA, PMS2, POLD1, POLE, PTEN, RAD50, RAD51C, RAD51D, RNF43, SDHB, SDHC, SDHD, SMAD4, SMARCA4. STK11, TP53, TSC1, TSC2, and VHL.  The following genes were evaluated for sequence changes only: SDHA and HOXB13 c.251G>A variant only.  Based on Ms. Kun personal history of cancer, she meets medical criteria for genetic testing. Despite that she meets criteria, she may still have an out of pocket cost.   PLAN: After considering the risks, benefits, and limitations, Ms. Leikam provided informed consent to pursue genetic testing and the blood sample was sent to [facility] for analysis of the Breast Cancer STAT panel  + Common Hereditary Cancers panel. Results should be available within approximately one weeks' time, at which point they will be disclosed by telephone to Tabitha Brown, as will any additional recommendations warranted by these results. Tabitha Brown will receive a summary of her genetic counseling visit and a copy of her results once available. This information will also be available in Epic.   Ms. Wieczorek questions were answered to her satisfaction today. Our contact information was provided should additional questions or concerns arise. Thank you for the referral and allowing [place] to share in the care of your patient.   Clint Guy, MS, [facility] Certified Genetic Counselor [email or web address] Phone: [phone]  The patient was seen for a total of 15 minutes in face-to-face genetic counseling.  This patient was discussed with Drs. Magrinat, Lindi Adie and/or Burr Medico who agrees with the above.    _______________________________________________________________________ For Office Staff:  Number of people involved in session: 1 Was an Intern/ student involved with case: no

## 2019-09-27 ENCOUNTER — Encounter: Payer: Self-pay | Admitting: Genetic Counselor

## 2019-10-03 ENCOUNTER — Telehealth: Payer: Self-pay | Admitting: Genetic Counselor

## 2019-10-03 ENCOUNTER — Encounter: Payer: Self-pay | Admitting: Genetic Counselor

## 2019-10-03 ENCOUNTER — Ambulatory Visit: Payer: Self-pay | Admitting: Genetic Counselor

## 2019-10-03 DIAGNOSIS — Z1379 Encounter for other screening for genetic and chromosomal anomalies: Secondary | ICD-10-CM | POA: Insufficient documentation

## 2019-10-03 NOTE — Progress Notes (Signed)
HPI:  Tabitha Brown was previously seen in the [place] clinic due to a personal history of breast cancer and concerns regarding a hereditary predisposition to cancer. Please refer to our prior cancer genetics clinic note for more information regarding our discussion, assessment and recommendations, at the time. Tabitha Brown recent genetic test results were disclosed to her, as were recommendations warranted by these results. These results and recommendations are discussed in more detail below.  CANCER HISTORY:  Oncology History  Malignant neoplasm of lower-inner quadrant of right breast of female, estrogen receptor positive ([place])  09/19/2019 Initial Diagnosis   Patient palpated a right breast mass. Mammogram on 06/05/19 showed a probably benign right breast mass at the 4 o'clock position measuring 0.5cm. Breast MRI on 09/05/19 showed a right breast mass at the 4 o'clock position with benign morphology. Biopsy showed IDC, grade 3, HER-2 - by FISH, ER+ 95%, PR+ 95%, Ki67 10%.     09/26/2019 Cancer Staging   Staging form: Breast, AJCC 8th Edition - Clinical stage from 09/26/2019: Stage IA (cT1b, cN0, cM0, G3, ER+, PR+, HER2-) - Signed by Nicholas Lose, MD on 09/26/2019   10/03/2019 Genetic Testing   Negative genetic testing:  No pathogenic variants detected on the [facility] Breast Cancer STAT panel with reflex to the Common Hereditary Cancers panel. A variant of uncertain significance was detected in the POLE gene, called c.6299C>T. The report date is 10/03/2019.   The STAT Breast cancer panel offered by [facility] includes sequencing and rearrangement analysis for the following 9 genes:  ATM, BRCA1, BRCA2, CDH1, CHEK2, PALB2, PTEN, STK11 and TP53.  The Common Hereditary Cancers Panel offered by [facility] includes sequencing and/or deletion duplication testing of the following 48 genes: APC, ATM, AXIN2, BARD1, BMPR1A, BRCA1, BRCA2, BRIP1, CDH1, CDK4, CDKN2A (p14ARF), CDKN2A (p16INK4a), CHEK2,  CTNNA1, DICER1, EPCAM (Deletion/duplication testing only), GREM1 (promoter region deletion/duplication testing only), KIT, MEN1, MLH1, MSH2, MSH3, MSH6, MUTYH, NBN, NF1, NHTL1, PALB2, PDGFRA, PMS2, POLD1, POLE, PTEN, RAD50, RAD51C, RAD51D, RNF43, SDHB, SDHC, SDHD, SMAD4, SMARCA4. STK11, TP53, TSC1, TSC2, and VHL.  The following genes were evaluated for sequence changes only: SDHA and HOXB13 c.251G>A variant only.      FAMILY HISTORY:  We obtained a detailed, 4-generation family history.  Significant diagnoses are listed below: Family History  Problem Relation Age of Onset   Alcohol abuse Maternal Grandfather        bile duct cancer   Heart disease Maternal Grandfather    Cancer Maternal Grandfather 89       bile duct cancer   Hypertension Paternal Grandmother     [name] has two sons ages 33 and 24. She has two sisters and one brother, none of whom have had cancer.  Tabitha Brown mother is 82 and has not had cancer. She has two maternal aunts. Her maternal grandmother died at age 80, and her maternal grandfather died at age 55 with bile duct cancer. There are no other known diagnoses of cancer on her maternal side of the family.  Tabitha Brown father is 79 and has not had cancer. She has three paternal aunts and one paternal uncle. Her paternal grandmother died at age 31 and her paternal grandfather died at age 62. There are no know diagnoses of cancer on the paternal side of the family.  Tabitha Brown is unaware of previous family history of genetic testing for hereditary cancer risks. Patient's maternal ancestors are of [place], [place], and [place] descent, and paternal ancestors are of [place]  and [place] descent.  Tabitha Brown had not heard that her paternal grandfather was specifically [place], but given that he was from [place] it is possible. There is no known consanguinity.  GENETIC TEST RESULTS: Genetic testing reported out on 10/03/2019 through the [facility] Breast Cancer STAT  with reflex to the Common Hereditary Cancers panel found no pathogenic variants. The STAT Breast cancer panel offered by [facility] includes sequencing and rearrangement analysis for the following 9 genes:  ATM, BRCA1, BRCA2, CDH1, CHEK2, PALB2, PTEN, STK11 and TP53.  The Common Hereditary Cancers Panel offered by [facility] includes sequencing and/or deletion duplication testing of the following 48 genes: APC, ATM, AXIN2, BARD1, BMPR1A, BRCA1, BRCA2, BRIP1, CDH1, CDK4, CDKN2A (p14ARF), CDKN2A (p16INK4a), CHEK2, CTNNA1, DICER1, EPCAM (Deletion/duplication testing only), GREM1 (promoter region deletion/duplication testing only), KIT, MEN1, MLH1, MSH2, MSH3, MSH6, MUTYH, NBN, NF1, NHTL1, PALB2, PDGFRA, PMS2, POLD1, POLE, PTEN, RAD50, RAD51C, RAD51D, RNF43, SDHB, SDHC, SDHD, SMAD4, SMARCA4. STK11, TP53, TSC1, TSC2, and VHL.  The following genes were evaluated for sequence changes only: SDHA and HOXB13 c.251G>A variant only.   The test report will be scanned into EPIC and located under the Molecular Pathology section of the Results Review tab.  A portion of the result report is included below for reference.    We discussed with Ms. Walle that because current genetic testing is not perfect, it is possible there may be a gene mutation in one of these genes that current testing cannot detect, but that chance is small.  We also discussed, that there could be another gene that has not yet been discovered, or that we have not yet tested, that is responsible for the cancer diagnoses in the family. It is also possible there is a hereditary cause for the cancer in the family that Ms. Lessig did not inherit and therefore was not identified in her testing.  Therefore, it is important to remain in touch with cancer genetics in the future so that we can continue to offer Ms. Ezekiel the most up to date genetic testing.   Genetic testing did identify a variant of uncertain significance (VUS) was identified in the POLE gene called  c.6299C>T.  At this time, it is unknown if this variant is associated with increased cancer risk or if this is a normal finding, but most variants such as this get reclassified to being inconsequential. It should not be used to make medical management decisions. With time, we suspect the lab will determine the significance of this variant, if any. If we do learn more about it, we will try to contact Ms. Tersigni to discuss it further. However, it is important to stay in touch with [place] periodically and keep the address and phone number up to date.  CANCER SCREENING RECOMMENDATIONS: Ms. Saldarriaga test result is considered negative (normal).  This means that we have not identified a hereditary cause for her personal and family history of cancer at this time. Most cancers happen by chance and this negative test suggests that her cancer may fall into this category.    While reassuring, this does not definitively rule out a hereditary predisposition to cancer. It is still possible that there could be genetic mutations that are undetectable by current technology. There could be genetic mutations in genes that have not been tested or identified to increase cancer risk.  Therefore, it is recommended she continue to follow the cancer management and screening guidelines provided by her oncology and primary healthcare provider.   An individual's cancer  risk and medical management are not determined by genetic test results alone. Overall cancer risk assessment incorporates additional factors, including personal medical history, family history, and any available genetic information that may result in a personalized plan for cancer prevention and surveillance.  RECOMMENDATIONS FOR FAMILY MEMBERS:  Individuals in this family might be at some increased risk of developing cancer, over the general population risk, simply due to the family history of cancer.  We recommended women in this family have a yearly mammogram beginning at  age 53, or 40 years younger than the earliest onset of cancer, an annual clinical breast exam, and perform monthly breast self-exams. Women in this family should also have a gynecological exam as recommended by their primary provider. All family members should have a colonoscopy by age 30.  FOLLOW-UP: Lastly, we discussed with Ms. Gasparyan that cancer genetics is a rapidly advancing field and it is possible that new genetic tests will be appropriate for her and/or her family members in the future. We encouraged her to remain in contact with cancer genetics on an annual basis so we can update her personal and family histories and let her know of advances in cancer genetics that may benefit this family.   Our contact number was provided. Ms. Raynes questions were answered to her satisfaction, and she knows she is welcome to call us at anytime with additional questions or concerns.   Clint Guy, MS, [facility] Certified Genetic Counselor [email or web address] Phone: [phone]

## 2019-10-03 NOTE — Telephone Encounter (Signed)
Revealed negative genetic testing.  Discussed that we do not know why she has breast cancer or why there is cancer in the family.  It could be due to a different gene that we are not testing, or our current technology may not be able detect something.  It will be important for her to keep in contact with genetics to keep up with whether additional testing may be appropriate in the future.  Genetic testing did detect a variant of uncertain significance in the POLE gene, called c.6299C>T.  Importantly, Tabitha Brown results are still considered normal and this variant should not impact her medical management.

## 2019-10-04 ENCOUNTER — Telehealth: Payer: Self-pay | Admitting: *Deleted

## 2019-10-04 NOTE — Telephone Encounter (Signed)
Spoke with patient to follow up from [facility] to assess navigation needs.  No needs at this time.

## 2019-10-05 ENCOUNTER — Other Ambulatory Visit: Payer: Self-pay | Admitting: General Surgery

## 2019-10-05 DIAGNOSIS — Z170 Estrogen receptor positive status [ER+]: Secondary | ICD-10-CM

## 2019-10-05 DIAGNOSIS — C50311 Malignant neoplasm of lower-inner quadrant of right female breast: Secondary | ICD-10-CM

## 2019-10-08 ENCOUNTER — Telehealth: Payer: Self-pay | Admitting: Hematology and Oncology

## 2019-10-08 NOTE — Telephone Encounter (Signed)
Scheduled appt per 10/26 sch message - pt is aware of appt date and time   

## 2019-10-11 ENCOUNTER — Encounter: Payer: Self-pay | Admitting: Hematology and Oncology

## 2019-10-11 NOTE — Pre-Procedure Instructions (Addendum)
[id number]: [phone]: [phone]     Your procedure is scheduled on Thursday, November 5th.  [facility].M., and check in at the Admitting office.  Call this number if you have problems the morning of surgery:  [phone] Call [phone] you have any questions prior to your surgery date Monday-Friday 8am-4pm    Remember:  Do not eat after midnight the night before your surgery  You may drink clear liquids until 6:30a.m. the morning of your surgery.   Clear liquids allowed are: Water, Non-Citrus Juices (without pulp), Carbonated Beverages, Clear Tea, Black Coffee Only, and Gatorade.  Please complete your PRE-SURGERY ENSURE that was provided to you by 6:30a.m. the morning of surgery.  Please, if able, drink it in one setting. DO NOT SIP.    Take these medicines the morning of surgery with A SIP OF WATER  As needed: cetirizine (ZYRTEC)  As of today, STOP taking any Aspirin (unless otherwise instructed by your surgeon), Aleve, Naproxen, Ibuprofen, Motrin, Advil, Goody's, BC's, all herbal medications, fish oil, and all vitamins.    The Morning of Surgery  Do not wear jewelry, make-up or nail polish.  Do not wear lotions, powders, or perfumes, or deodorant  Do not shave 48 hours prior to surgery.   Do not bring valuables to the hospital.  [facility] any belongings or valuables.  If you are a smoker, DO NOT Smoke 24 hours prior to surgery IF you wear a CPAP at night please bring your mask, tubing, and machine the morning of surgery   Remember that you must have someone to transport you home after your surgery, and remain with you for 24 hours if you are discharged the same day.   Contacts, glasses, hearing aids, dentures or bridgework may not be worn into surgery.    Leave your suitcase in the car.   After surgery it may be brought to your room.  For patients admitted to the hospital, discharge time will be determined by your treatment team.  Patients discharged the day of surgery will not be allowed to drive home.    Special instructions:   [facility] [place]- Preparing For Surgery  Before surgery, you can play an important role. Because skin is not sterile, your skin needs to be as free of germs as possible. You can reduce the number of germs on your skin by washing with CHG (chlorahexidine gluconate) Soap before surgery.  CHG is an antiseptic cleaner which kills germs and bonds with the skin to continue killing germs even after washing.    Oral Hygiene is also important to reduce your risk of infection.  Remember - BRUSH YOUR TEETH THE MORNING OF SURGERY WITH YOUR REGULAR TOOTHPASTE  Please do not use if you have an allergy to CHG or antibacterial soaps. If your skin becomes reddened/irritated stop using the CHG.  Do not shave (including legs and underarms) for at least 48 hours prior to first CHG shower. It is OK to shave your face.  Please follow these instructions carefully.   1. Shower the NIGHT BEFORE SURGERY and the MORNING OF SURGERY with CHG Soap.   2. If you chose to wash your hair, wash your hair first as usual with your normal shampoo.  3. After you shampoo, rinse your hair and body thoroughly to remove the shampoo.  4.  Use CHG as you would any other liquid soap. You can apply CHG directly to the skin and wash gently with a scrungie or a clean washcloth.   5. Apply the CHG Soap to your body ONLY FROM THE NECK DOWN.  Do not use on open wounds or open sores. Avoid contact with your eyes, ears, mouth and genitals (private parts). Wash Face and genitals (private parts)  with your normal soap.   6. Wash thoroughly, paying special attention to the area where your surgery will be performed.  7. Thoroughly rinse your body with warm water from the neck down.  8. DO NOT shower/wash  with your normal soap after using and rinsing off the CHG Soap.  9. Pat yourself dry with a CLEAN TOWEL.  10. Wear CLEAN PAJAMAS to bed the night before surgery, wear comfortable clothes the morning of surgery  11. Place CLEAN SHEETS on your bed the night of your first shower and DO NOT SLEEP WITH PETS.    Day of Surgery:  Do not apply any deodorants/lotions. Please shower the morning of surgery with the CHG soap  Please wear clean clothes to the hospital/surgery center.   Remember to brush your teeth WITH YOUR REGULAR TOOTHPASTE.   Please read over the following fact sheets that you were given.

## 2019-10-12 ENCOUNTER — Encounter (HOSPITAL_COMMUNITY)
Admission: RE | Admit: 2019-10-12 | Discharge: 2019-10-12 | Disposition: A | Discharge status: Home / Self Care | Payer: BC Managed Care – PPO | Source: Ambulatory Visit | Attending: General Surgery | Admitting: General Surgery

## 2019-10-12 ENCOUNTER — Other Ambulatory Visit: Payer: Self-pay

## 2019-10-12 ENCOUNTER — Encounter (HOSPITAL_COMMUNITY): Payer: Self-pay

## 2019-10-12 DIAGNOSIS — Z170 Estrogen receptor positive status [ER+]: Secondary | ICD-10-CM

## 2019-10-12 DIAGNOSIS — C50311 Malignant neoplasm of lower-inner quadrant of right female breast: Secondary | ICD-10-CM

## 2019-10-12 DIAGNOSIS — Z01812 Encounter for preprocedural laboratory examination: Secondary | ICD-10-CM | POA: Insufficient documentation

## 2019-10-12 HISTORY — DX: Malignant (primary) neoplasm, unspecified: C80.1

## 2019-10-12 LAB — CBC
HCT: 42.7 % (ref 36.0–46.0)
Hemoglobin: 14.7 g/dL (ref 12.0–15.0)
MCH: 31.6 pg (ref 26.0–34.0)
MCHC: 34.4 g/dL (ref 30.0–36.0)
MCV: 91.8 fL (ref 80.0–100.0)
Platelets: 200 10*3/uL (ref 150–400)
RBC: 4.65 MIL/uL (ref 3.87–5.11)
RDW: 11.4 % — ABNORMAL LOW (ref 11.5–15.5)
WBC: 4.5 10*3/uL (ref 4.0–10.5)
nRBC: 0 % (ref 0.0–0.2)

## 2019-10-12 LAB — POCT PREGNANCY, URINE: Preg Test, Ur: NEGATIVE

## 2019-10-12 NOTE — Progress Notes (Signed)
PCP - Dr. Betty Martinique Cardiologist - denies  PPM/ICD - N/A Device Orders -  Rep Notified -   Chest x-ray - N/A EKG - N/A Stress Test -denies  ECHO - denies Cardiac Cath - denies  Sleep Study - denies CPAP - denies  Blood Thinner Instructions:N/A Aspirin Instructions:N/A  ERAS Protcol -Yes PRE-SURGERY Ensure or G2- Ensure given.   COVID TEST- Monday, 11/2.   Anesthesia review: No  Patient denies shortness of breath, fever, cough and chest pain at PAT appointment   All instructions explained to the patient, with a verbal understanding of the material. Patient agrees to go over the instructions while at home for a better understanding. Patient also instructed to self quarantine after being tested for COVID-19. The opportunity to ask questions was provided.    Coronavirus Screening  Have you experienced the following symptoms:  Cough yes/no: No Fever (>100.52F)  yes/no: No Runny nose yes/no: No Sore throat yes/no: No Difficulty breathing/shortness of breath  yes/no: No  Have you or a family member traveled in the last 14 days and where? yes/no: No   If the patient indicates "YES" to the above questions, their PAT will be rescheduled to limit the exposure to others and, the surgeon will be notified. THE PATIENT WILL NEED TO BE ASYMPTOMATIC FOR 14 DAYS.   If the patient is not experiencing any of these symptoms, the PAT nurse will instruct them to NOT bring anyone with them to their appointment since they may have these symptoms or traveled as well.   Please remind your patients and families that hospital visitation restrictions are in effect and the importance of the restrictions.

## 2019-10-15 ENCOUNTER — Other Ambulatory Visit (HOSPITAL_COMMUNITY)
Admission: RE | Admit: 2019-10-15 | Discharge: 2019-10-15 | Disposition: A | Discharge status: Home / Self Care | Payer: BC Managed Care – PPO | Source: Ambulatory Visit | Attending: General Surgery | Admitting: General Surgery

## 2019-10-15 DIAGNOSIS — Z20828 Contact with and (suspected) exposure to other viral communicable diseases: Secondary | ICD-10-CM | POA: Insufficient documentation

## 2019-10-15 DIAGNOSIS — Z01812 Encounter for preprocedural laboratory examination: Secondary | ICD-10-CM | POA: Insufficient documentation

## 2019-10-15 LAB — SARS CORONAVIRUS 2 (TAT 6-24 HRS): SARS Coronavirus 2: NEGATIVE

## 2019-10-16 ENCOUNTER — Telehealth: Payer: Self-pay

## 2019-10-16 ENCOUNTER — Encounter (HOSPITAL_COMMUNITY)
Admission: RE | Admit: 2019-10-16 | Discharge: 2019-10-16 | Disposition: A | Discharge status: Home / Self Care | Payer: BC Managed Care – PPO | Source: Ambulatory Visit | Attending: Hematology and Oncology | Admitting: Hematology and Oncology

## 2019-10-16 ENCOUNTER — Other Ambulatory Visit: Payer: Self-pay

## 2019-10-16 ENCOUNTER — Ambulatory Visit (HOSPITAL_COMMUNITY)
Admission: RE | Admit: 2019-10-16 | Discharge: 2019-10-16 | Disposition: A | Discharge status: Home / Self Care | Payer: BC Managed Care – PPO | Source: Ambulatory Visit | Attending: Hematology and Oncology | Admitting: Hematology and Oncology

## 2019-10-16 ENCOUNTER — Encounter (HOSPITAL_COMMUNITY): Payer: Self-pay

## 2019-10-16 DIAGNOSIS — C50311 Malignant neoplasm of lower-inner quadrant of right female breast: Secondary | ICD-10-CM | POA: Insufficient documentation

## 2019-10-16 DIAGNOSIS — Z170 Estrogen receptor positive status [ER+]: Secondary | ICD-10-CM | POA: Diagnosis not present

## 2019-10-16 DIAGNOSIS — C50911 Malignant neoplasm of unspecified site of right female breast: Secondary | ICD-10-CM | POA: Diagnosis not present

## 2019-10-16 MED ORDER — TECHNETIUM TC 99M MEDRONATE IV KIT
20.7000 | PACK | Freq: Once | INTRAVENOUS | Status: AC | PRN
  Administered 2019-10-16: 20.7 via INTRAVENOUS

## 2019-10-16 MED ORDER — IOHEXOL 300 MG/ML  SOLN
75.0000 mL | Freq: Once | INTRAMUSCULAR | Status: AC | PRN
  Administered 2019-10-16: 08:00:00 75 mL via INTRAVENOUS

## 2019-10-16 MED ORDER — SODIUM CHLORIDE (PF) 0.9 % IJ SOLN
INTRAMUSCULAR | Status: AC
  Filled 2019-10-16: qty 50

## 2019-10-16 NOTE — Telephone Encounter (Signed)
Nutrition  Patient identified after attended [facility] on 10/14.    Chart reviewed.  Called to introduce self and service to [facility].  No answer.  Left message with call back number.  Vilda Zollner B. Zenia Resides, [place], [place] Registered Dietitian [phone] (pager)

## 2019-10-17 ENCOUNTER — Encounter (HOSPITAL_COMMUNITY): Payer: BC Managed Care – PPO

## 2019-10-17 ENCOUNTER — Ambulatory Visit
Admission: RE | Admit: 2019-10-17 | Discharge: 2019-10-17 | Disposition: A | Discharge status: Home / Self Care | Payer: BC Managed Care – PPO | Source: Ambulatory Visit | Attending: General Surgery | Admitting: General Surgery

## 2019-10-17 ENCOUNTER — Encounter (HOSPITAL_COMMUNITY): Admission: RE | Admit: 2019-10-17 | Payer: BC Managed Care – PPO | Source: Ambulatory Visit

## 2019-10-17 ENCOUNTER — Other Ambulatory Visit: Payer: Self-pay | Admitting: General Surgery

## 2019-10-17 DIAGNOSIS — Z170 Estrogen receptor positive status [ER+]: Secondary | ICD-10-CM

## 2019-10-17 DIAGNOSIS — C50311 Malignant neoplasm of lower-inner quadrant of right female breast: Secondary | ICD-10-CM

## 2019-10-17 DIAGNOSIS — C50911 Malignant neoplasm of unspecified site of right female breast: Secondary | ICD-10-CM | POA: Diagnosis not present

## 2019-10-18 ENCOUNTER — Encounter (HOSPITAL_COMMUNITY): Payer: BC Managed Care – PPO

## 2019-10-18 ENCOUNTER — Telehealth: Payer: Self-pay | Admitting: Hematology and Oncology

## 2019-10-18 ENCOUNTER — Other Ambulatory Visit: Payer: Self-pay

## 2019-10-18 ENCOUNTER — Ambulatory Visit (HOSPITAL_COMMUNITY)
Admission: RE | Admit: 2019-10-18 | Discharge: 2019-10-18 | Disposition: A | Discharge status: Home / Self Care | Payer: BC Managed Care – PPO | Attending: General Surgery | Admitting: General Surgery

## 2019-10-18 ENCOUNTER — Encounter (HOSPITAL_COMMUNITY): Payer: Self-pay

## 2019-10-18 ENCOUNTER — Ambulatory Visit (HOSPITAL_COMMUNITY)
Admission: RE | Admit: 2019-10-18 | Discharge: 2019-10-18 | Disposition: A | Discharge status: Home / Self Care | Payer: BC Managed Care – PPO | Source: Ambulatory Visit | Attending: General Surgery | Admitting: General Surgery

## 2019-10-18 ENCOUNTER — Encounter (HOSPITAL_COMMUNITY)
Admission: RE | Disposition: A | Discharge status: Home / Self Care | Payer: Self-pay | Source: Home / Self Care | Attending: General Surgery

## 2019-10-18 ENCOUNTER — Ambulatory Visit (HOSPITAL_COMMUNITY): Payer: BC Managed Care – PPO | Admitting: Anesthesiology

## 2019-10-18 ENCOUNTER — Ambulatory Visit
Admission: RE | Admit: 2019-10-18 | Discharge: 2019-10-18 | Disposition: A | Discharge status: Home / Self Care | Payer: BC Managed Care – PPO | Source: Ambulatory Visit | Attending: General Surgery | Admitting: General Surgery

## 2019-10-18 DIAGNOSIS — Z800 Family history of malignant neoplasm of digestive organs: Secondary | ICD-10-CM | POA: Insufficient documentation

## 2019-10-18 DIAGNOSIS — C50911 Malignant neoplasm of unspecified site of right female breast: Secondary | ICD-10-CM | POA: Diagnosis not present

## 2019-10-18 DIAGNOSIS — F509 Eating disorder, unspecified: Secondary | ICD-10-CM | POA: Insufficient documentation

## 2019-10-18 DIAGNOSIS — D0511 Intraductal carcinoma in situ of right breast: Secondary | ICD-10-CM | POA: Diagnosis not present

## 2019-10-18 DIAGNOSIS — C50311 Malignant neoplasm of lower-inner quadrant of right female breast: Secondary | ICD-10-CM | POA: Diagnosis not present

## 2019-10-18 DIAGNOSIS — Z811 Family history of alcohol abuse and dependence: Secondary | ICD-10-CM | POA: Diagnosis not present

## 2019-10-18 DIAGNOSIS — Z882 Allergy status to sulfonamides status: Secondary | ICD-10-CM | POA: Diagnosis not present

## 2019-10-18 DIAGNOSIS — Z170 Estrogen receptor positive status [ER+]: Secondary | ICD-10-CM | POA: Insufficient documentation

## 2019-10-18 DIAGNOSIS — D0581 Other specified type of carcinoma in situ of right breast: Secondary | ICD-10-CM | POA: Diagnosis not present

## 2019-10-18 DIAGNOSIS — Z681 Body mass index (BMI) 19 or less, adult: Secondary | ICD-10-CM | POA: Insufficient documentation

## 2019-10-18 DIAGNOSIS — G8918 Other acute postprocedural pain: Secondary | ICD-10-CM | POA: Diagnosis not present

## 2019-10-18 HISTORY — PX: BREAST LUMPECTOMY WITH RADIOACTIVE SEED AND SENTINEL LYMPH NODE BIOPSY: SHX6550

## 2019-10-18 SURGERY — BREAST LUMPECTOMY WITH RADIOACTIVE SEED AND SENTINEL LYMPH NODE BIOPSY
Anesthesia: General | Site: Breast | Laterality: Right

## 2019-10-18 MED ORDER — LACTATED RINGERS IV SOLN
INTRAVENOUS | Status: DC | PRN
  Administered 2019-10-18 (×2): via INTRAVENOUS

## 2019-10-18 MED ORDER — KETOROLAC TROMETHAMINE 15 MG/ML IJ SOLN
INTRAMUSCULAR | Status: AC
  Administered 2019-10-18: 10:00:00 15 mg via INTRAVENOUS
  Filled 2019-10-18: qty 1

## 2019-10-18 MED ORDER — FENTANYL CITRATE (PF) 250 MCG/5ML IJ SOLN
INTRAMUSCULAR | Status: DC | PRN
  Administered 2019-10-18 (×2): 50 ug via INTRAVENOUS

## 2019-10-18 MED ORDER — ONDANSETRON HCL 4 MG/2ML IJ SOLN
4.0000 mg | Freq: Once | INTRAMUSCULAR | Status: AC
  Administered 2019-10-18: 14:00:00 4 mg via INTRAVENOUS

## 2019-10-18 MED ORDER — CELECOXIB 200 MG PO CAPS
ORAL_CAPSULE | ORAL | Status: AC
  Administered 2019-10-18: 400 mg via ORAL
  Filled 2019-10-18: qty 2

## 2019-10-18 MED ORDER — MIDAZOLAM HCL 2 MG/2ML IJ SOLN
INTRAMUSCULAR | Status: AC
  Administered 2019-10-18: 10:00:00 2 mg via INTRAVENOUS
  Filled 2019-10-18: qty 2

## 2019-10-18 MED ORDER — CEFAZOLIN SODIUM-DEXTROSE 2-4 GM/100ML-% IV SOLN
INTRAVENOUS | Status: AC
  Filled 2019-10-18: qty 100

## 2019-10-18 MED ORDER — FENTANYL CITRATE (PF) 100 MCG/2ML IJ SOLN
INTRAMUSCULAR | Status: AC
  Filled 2019-10-18: qty 2

## 2019-10-18 MED ORDER — PROMETHAZINE HCL 25 MG/ML IJ SOLN
INTRAMUSCULAR | Status: AC
  Filled 2019-10-18: qty 1

## 2019-10-18 MED ORDER — FENTANYL CITRATE (PF) 250 MCG/5ML IJ SOLN
INTRAMUSCULAR | Status: AC
  Filled 2019-10-18: qty 5

## 2019-10-18 MED ORDER — PROMETHAZINE HCL 25 MG/ML IJ SOLN
6.2500 mg | INTRAMUSCULAR | Status: DC | PRN
  Administered 2019-10-18: 14:00:00 6.25 mg via INTRAVENOUS

## 2019-10-18 MED ORDER — ONDANSETRON HCL 4 MG/2ML IJ SOLN
INTRAMUSCULAR | Status: AC
  Filled 2019-10-18: qty 2

## 2019-10-18 MED ORDER — BUPIVACAINE-EPINEPHRINE (PF) 0.5% -1:200000 IJ SOLN
INTRAMUSCULAR | Status: DC | PRN
  Administered 2019-10-18: 4 mL

## 2019-10-18 MED ORDER — FENTANYL CITRATE (PF) 100 MCG/2ML IJ SOLN
100.0000 ug | Freq: Once | INTRAMUSCULAR | Status: AC
  Administered 2019-10-18: 10:00:00 100 ug via INTRAVENOUS

## 2019-10-18 MED ORDER — STERILE WATER FOR IRRIGATION IR SOLN
Status: DC | PRN
  Administered 2019-10-18: 1000 mL

## 2019-10-18 MED ORDER — MIDAZOLAM HCL 2 MG/2ML IJ SOLN
2.0000 mg | Freq: Once | INTRAMUSCULAR | Status: AC
  Administered 2019-10-18: 10:00:00 2 mg via INTRAVENOUS

## 2019-10-18 MED ORDER — PROPOFOL 10 MG/ML IV BOLUS
INTRAVENOUS | Status: AC
  Filled 2019-10-18: qty 20

## 2019-10-18 MED ORDER — GABAPENTIN 100 MG PO CAPS
ORAL_CAPSULE | ORAL | Status: AC
  Administered 2019-10-18: 100 mg via ORAL
  Filled 2019-10-18: qty 1

## 2019-10-18 MED ORDER — SODIUM CHLORIDE (PF) 0.9 % IJ SOLN
INTRAMUSCULAR | Status: AC
  Filled 2019-10-18: qty 10

## 2019-10-18 MED ORDER — MIDAZOLAM HCL 2 MG/2ML IJ SOLN
INTRAMUSCULAR | Status: DC | PRN
  Administered 2019-10-18: 2 mg via INTRAVENOUS

## 2019-10-18 MED ORDER — PROPOFOL 10 MG/ML IV BOLUS
INTRAVENOUS | Status: DC | PRN
  Administered 2019-10-18: 40 mg via INTRAVENOUS
  Administered 2019-10-18: 120 mg via INTRAVENOUS

## 2019-10-18 MED ORDER — MORPHINE SULFATE (PF) 2 MG/ML IV SOLN: 1.0000 mg | INTRAVENOUS | Status: DC | PRN

## 2019-10-18 MED ORDER — ACETAMINOPHEN 500 MG PO TABS
1000.0000 mg | ORAL_TABLET | Freq: Once | ORAL | Status: AC
  Administered 2019-10-18: 10:00:00 1000 mg via ORAL

## 2019-10-18 MED ORDER — BUPIVACAINE-EPINEPHRINE 0.5% -1:200000 IJ SOLN
INTRAMUSCULAR | Status: AC
  Filled 2019-10-18: qty 1

## 2019-10-18 MED ORDER — METHYLENE BLUE 0.5 % INJ SOLN
INTRAVENOUS | Status: AC
  Filled 2019-10-18: qty 10

## 2019-10-18 MED ORDER — ENSURE PRE-SURGERY PO LIQD: 296.0000 mL | Freq: Once | ORAL | Status: DC

## 2019-10-18 MED ORDER — FENTANYL CITRATE (PF) 100 MCG/2ML IJ SOLN
25.0000 ug | INTRAMUSCULAR | Status: DC | PRN
  Administered 2019-10-18: 13:00:00 25 ug via INTRAVENOUS

## 2019-10-18 MED ORDER — OXYCODONE HCL 5 MG PO TABS: 5.0000 mg | ORAL_TABLET | Freq: Four times a day (QID) | ORAL | 0 refills | Status: DC | PRN

## 2019-10-18 MED ORDER — KETOROLAC TROMETHAMINE 15 MG/ML IJ SOLN
15.0000 mg | INTRAMUSCULAR | Status: AC
  Administered 2019-10-18: 10:00:00 15 mg via INTRAVENOUS

## 2019-10-18 MED ORDER — BUPIVACAINE HCL (PF) 0.25 % IJ SOLN
INTRAMUSCULAR | Status: DC | PRN
  Administered 2019-10-18: 15 mL

## 2019-10-18 MED ORDER — ACETAMINOPHEN 500 MG PO TABS: 1000.0000 mg | ORAL_TABLET | ORAL | Status: DC

## 2019-10-18 MED ORDER — SCOPOLAMINE 1 MG/3DAYS TD PT72
MEDICATED_PATCH | TRANSDERMAL | Status: AC
  Administered 2019-10-18: 1.5 mg via TRANSDERMAL
  Filled 2019-10-18: qty 1

## 2019-10-18 MED ORDER — BUPIVACAINE LIPOSOME 1.3 % IJ SUSP
INTRAMUSCULAR | Status: DC | PRN
  Administered 2019-10-18: 10 mL

## 2019-10-18 MED ORDER — FENTANYL CITRATE (PF) 100 MCG/2ML IJ SOLN
INTRAMUSCULAR | Status: AC
  Administered 2019-10-18: 10:00:00 100 ug via INTRAVENOUS
  Filled 2019-10-18: qty 2

## 2019-10-18 MED ORDER — EPHEDRINE SULFATE 50 MG/ML IJ SOLN
INTRAMUSCULAR | Status: DC | PRN
  Administered 2019-10-18 (×3): 5 mg via INTRAVENOUS

## 2019-10-18 MED ORDER — SCOPOLAMINE 1 MG/3DAYS TD PT72
1.0000 | MEDICATED_PATCH | TRANSDERMAL | Status: DC
  Administered 2019-10-18: 10:00:00 1.5 mg via TRANSDERMAL

## 2019-10-18 MED ORDER — DEXAMETHASONE SODIUM PHOSPHATE 10 MG/ML IJ SOLN
INTRAMUSCULAR | Status: DC | PRN
  Administered 2019-10-18: 5 mg via INTRAVENOUS

## 2019-10-18 MED ORDER — 0.9 % SODIUM CHLORIDE (POUR BTL) OPTIME
TOPICAL | Status: DC | PRN
  Administered 2019-10-18: 1000 mL

## 2019-10-18 MED ORDER — OXYCODONE HCL 5 MG PO TABS
5.0000 mg | ORAL_TABLET | ORAL | Status: DC | PRN
  Administered 2019-10-18: 13:00:00 5 mg via ORAL

## 2019-10-18 MED ORDER — OXYCODONE HCL 5 MG PO TABS
ORAL_TABLET | ORAL | Status: AC
  Filled 2019-10-18: qty 1

## 2019-10-18 MED ORDER — ACETAMINOPHEN 500 MG PO TABS
ORAL_TABLET | ORAL | Status: AC
  Administered 2019-10-18: 10:00:00 1000 mg via ORAL
  Filled 2019-10-18: qty 2

## 2019-10-18 MED ORDER — GABAPENTIN 100 MG PO CAPS
100.0000 mg | ORAL_CAPSULE | ORAL | Status: AC
  Administered 2019-10-18: 10:00:00 100 mg via ORAL

## 2019-10-18 MED ORDER — CELECOXIB 200 MG PO CAPS
400.0000 mg | ORAL_CAPSULE | Freq: Once | ORAL | Status: AC
  Administered 2019-10-18: 10:00:00 400 mg via ORAL

## 2019-10-18 MED ORDER — CEFAZOLIN SODIUM-DEXTROSE 2-4 GM/100ML-% IV SOLN
2.0000 g | INTRAVENOUS | Status: AC
  Administered 2019-10-18: 2 g via INTRAVENOUS

## 2019-10-18 MED ORDER — BUPIVACAINE HCL (PF) 0.25 % IJ SOLN
INTRAMUSCULAR | Status: AC
  Filled 2019-10-18: qty 30

## 2019-10-18 MED ORDER — TECHNETIUM TC 99M SULFUR COLLOID FILTERED
1.0000 | Freq: Once | INTRAVENOUS | Status: AC | PRN
  Administered 2019-10-18: 11:00:00 1 via INTRADERMAL

## 2019-10-18 MED ORDER — PHENYLEPHRINE 40 MCG/ML (10ML) SYRINGE FOR IV PUSH (FOR BLOOD PRESSURE SUPPORT)
PREFILLED_SYRINGE | INTRAVENOUS | Status: DC | PRN
  Administered 2019-10-18: 8 ug via INTRAVENOUS
  Administered 2019-10-18 (×4): 80 ug via INTRAVENOUS

## 2019-10-18 MED ORDER — MIDAZOLAM HCL 2 MG/2ML IJ SOLN
INTRAMUSCULAR | Status: AC
  Filled 2019-10-18: qty 2

## 2019-10-18 MED ORDER — ONDANSETRON HCL 4 MG/2ML IJ SOLN
INTRAMUSCULAR | Status: DC | PRN
  Administered 2019-10-18: 4 mg via INTRAVENOUS

## 2019-10-18 SURGICAL SUPPLY — 38 items
APPLIER CLIP 9.375 MED OPEN (MISCELLANEOUS) ×2
BINDER BREAST LRG (GAUZE/BANDAGES/DRESSINGS) ×1 IMPLANT
CANISTER SUCT 3000ML PPV (MISCELLANEOUS) ×2 IMPLANT
CHLORAPREP W/TINT 26 (MISCELLANEOUS) ×2 IMPLANT
CLIP APPLIE 9.375 MED OPEN (MISCELLANEOUS) IMPLANT
CLIP VESOCCLUDE MED 6/CT (CLIP) ×2 IMPLANT
CLSR STERI-STRIP ANTIMIC 1/2X4 (GAUZE/BANDAGES/DRESSINGS) ×1 IMPLANT
COVER PROBE W GEL 5X96 (DRAPES) ×2 IMPLANT
COVER SURGICAL LIGHT HANDLE (MISCELLANEOUS) ×2 IMPLANT
DERMABOND ADVANCED (GAUZE/BANDAGES/DRESSINGS) ×1
DERMABOND ADVANCED .7 DNX12 (GAUZE/BANDAGES/DRESSINGS) ×1 IMPLANT
DEVICE DUBIN SPECIMEN MAMMOGRA (MISCELLANEOUS) ×2 IMPLANT
DRAPE CHEST BREAST 15X10 FENES (DRAPES) ×2 IMPLANT
ELECT COATED BLADE 2.86 ST (ELECTRODE) ×2 IMPLANT
ELECT REM PT RETURN 9FT ADLT (ELECTROSURGICAL) ×2
ELECTRODE REM PT RTRN 9FT ADLT (ELECTROSURGICAL) ×1 IMPLANT
GLOVE BIO SURGEON STRL SZ7 (GLOVE) ×2 IMPLANT
GLOVE BIOGEL PI IND STRL 7.5 (GLOVE) ×1 IMPLANT
GLOVE BIOGEL PI INDICATOR 7.5 (GLOVE) ×1
GOWN STRL REUS W/ TWL LRG LVL3 (GOWN DISPOSABLE) ×2 IMPLANT
GOWN STRL REUS W/TWL LRG LVL3 (GOWN DISPOSABLE) ×2
KIT BASIN OR (CUSTOM PROCEDURE TRAY) ×2 IMPLANT
KIT MARKER MARGIN INK (KITS) ×2 IMPLANT
NDL HYPO 25GX1X1/2 BEV (NEEDLE) ×1 IMPLANT
NEEDLE HYPO 25GX1X1/2 BEV (NEEDLE) ×2 IMPLANT
NS IRRIG 1000ML POUR BTL (IV SOLUTION) ×2 IMPLANT
PACK GENERAL/GYN (CUSTOM PROCEDURE TRAY) ×2 IMPLANT
STRIP CLOSURE SKIN 1/2X4 (GAUZE/BANDAGES/DRESSINGS) ×2 IMPLANT
SUT MNCRL AB 4-0 PS2 18 (SUTURE) ×4 IMPLANT
SUT MON AB 5-0 PS2 18 (SUTURE) ×1 IMPLANT
SUT SILK 2 0 SH (SUTURE) ×1 IMPLANT
SUT VIC AB 2-0 SH 27 (SUTURE) ×2
SUT VIC AB 2-0 SH 27XBRD (SUTURE) ×2 IMPLANT
SUT VIC AB 3-0 SH 27 (SUTURE) ×2
SUT VIC AB 3-0 SH 27X BRD (SUTURE) ×2 IMPLANT
SYR CONTROL 10ML LL (SYRINGE) ×2 IMPLANT
TOWEL GREEN STERILE (TOWEL DISPOSABLE) ×2 IMPLANT
TOWEL GREEN STERILE FF (TOWEL DISPOSABLE) ×2 IMPLANT

## 2019-10-18 NOTE — Transfer of Care (Signed)
Immediate Anesthesia Transfer of Care Note  Patient: Tabitha Brown  Procedure(s) Performed: RIGHT BREAST LUMPECTOMY WITH RADIOACTIVE SEED AND RIGHT AXILLARY SENTINEL LYMPH NODE BIOPSY (Right Breast)  Patient Location: PACU  Anesthesia Type:General  Level of Consciousness: drowsy and patient cooperative  Airway & Oxygen Therapy: Patient Spontanous Breathing  Post-op Assessment: Report given to RN and Post -op Vital signs reviewed and stable  Post vital signs: Reviewed and stable  Last Vitals:  Vitals Value Taken Time  BP 97/68 10/18/19 1223  Temp    Pulse 114 10/18/19 1224  Resp 13 10/18/19 1224  SpO2 98 % 10/18/19 1224  Vitals shown include unvalidated device data.  Last Pain:  Vitals:   10/18/19 0935  TempSrc:   PainSc: 0-No pain         Complications: No apparent anesthesia complications

## 2019-10-18 NOTE — Telephone Encounter (Signed)
I informed the patient that the CT scans revealed a left axillary lymph node. We will request radiology to do an ultrasound and biopsy if it is abnormal.

## 2019-10-18 NOTE — Op Note (Signed)
Preoperative diagnosis:clinical stage I rightbreast cancer Postoperative diagnosis: Same as above Procedure:Rightbreastseed guided lumpectomy Rightdeep axillary sentinel node biopsy Surgeon: Dr. Serita Grammes Anesthesia: General  Specimens: 1.Rightbreast tissue marked with paint, containing seed  2. Additionalright breast medial/posterior margin containing clip 3. Additional right medial margin marked short superior, long medial, double deep 3.Rightdeep axillary nodes Estimated blood IDPO:24 cc Complications: None Drains: None Sponge and count was correct at completion Disposition to recovery in stable condition  Indications: This is [id number] who has a clinical stage I right breast cancer.We discussed options and have elected to proceed with lumpectomy/sn biopsy. The radioactive seed was placed prior to beginning. The seed was 9 mm away from the clip on mammography  Procedure: After informed consent was obtained shewas placed under general anesthesia without complication. She was given antibiotics. SCDs were in place. She was then prepped and draped in the standard sterile surgical fashion. A surgical timeout was then performed.  I then identified the seed in the medial  right breast.Imade a periareolar incision to hide the scar later.Iinfiltrated Marcaine.I then tunneled to the seed using the neoprobe for guidance. I then removed the seed and the surrounding tissue with an attempt to get a clear margin. The seed was on the specimen mammogram. there was a nodule that was really the posterior margin and maybe some medial of what I removed that was still attached to the pec. I was concerned initially this was capsule to implant but I did excise this and the clip was present.  I took some more medial margin. The smaller specimen otherwise is part of the larger specimen and was contained in it. The posterior margin was the pectoralis.I mobilized the breast tissue  and closed this with 2-0 Vicryl, 3-0 Vicryl, and5-0 Monocryl.glue and steristrips were applied.   I then made an incision below the axillary hairline and carried this through the axillary fascia. I then located the sentinel nodes in the low axilla. I then identified the sentinel node with a count of353.  I excised these. The background radioactivity was less than 10% and there were no palpable nodes present. I then obtained hemostasis. I closed the fascia with 2-0 vicryl. I closed the skin with 3-0 vicryl and 4-0 monocryl.  Glue was placed.  She had a binder placed. She was extubated and transferred to recovery stable

## 2019-10-18 NOTE — Discharge Instructions (Signed)
[place] Office Phone Number [phone]  BREAST BIOPSY/ PARTIAL MASTECTOMY: POST OP INSTRUCTIONS Take 400 mg of ibuprofen every 8 hours or 650 mg tylenol every 6 hours for next 72 hours then as needed. Use ice several times daily also. Always review your discharge instruction sheet given to you by the facility where your surgery was performed.  IF YOU HAVE DISABILITY OR FAMILY LEAVE FORMS, YOU MUST BRING THEM TO THE OFFICE FOR PROCESSING.  DO NOT GIVE THEM TO YOUR DOCTOR.  1. A prescription for pain medication may be given to you upon discharge.  Take your pain medication as prescribed, if needed.  If narcotic pain medicine is not needed, then you may take acetaminophen (Tylenol), naprosyn (Alleve) or ibuprofen (Advil) as needed. 2. Take your usually prescribed medications unless otherwise directed 3. If you need a refill on your pain medication, please contact your pharmacy.  They will contact our office to request authorization.  Prescriptions will not be filled after 5pm or on week-ends. 4. You should eat very light the first 24 hours after surgery, such as soup, crackers, pudding, etc.  Resume your normal diet the day after surgery. 5. Most patients will experience some swelling and bruising in the breast.  Ice packs and a good support bra will help.  Wear the breast binder provided or a sports bra for 72 hours day and night.  After that wear a sports bra during the day until you return to the office. Swelling and bruising can take several days to resolve.  6. It is common to experience some constipation if taking pain medication after surgery.  Increasing fluid intake and taking a stool softener will usually help or prevent this problem from occurring.  A mild laxative (Milk of Magnesia or Miralax) should be taken according to package directions if there are no bowel movements after 48 hours. 7. Unless discharge instructions indicate otherwise, you may remove your bandages 48  hours after surgery and you may shower at that time.  You may have steri-strips (small skin tapes) in place directly over the incision.  These strips should be left on the skin for 7-10 days and will come off on their own.  If your surgeon used skin glue on the incision, you may shower in 24 hours.  The glue will flake off over the next 2-3 weeks.  Any sutures or staples will be removed at the office during your follow-up visit. 8. ACTIVITIES:  You may resume regular daily activities (gradually increasing) beginning the next day.  Wearing a good support bra or sports bra minimizes pain and swelling.  You may have sexual intercourse when it is comfortable. a. You may drive when you no longer are taking prescription pain medication, you can comfortably wear a seatbelt, and you can safely maneuver your car and apply brakes. b. RETURN TO WORK:  ______________________________________________________________________________________ 9. You should see your doctor in the office for a follow-up appointment approximately two weeks after your surgery.  Your doctors nurse will typically make your follow-up appointment when she calls you with your pathology report.  Expect your pathology report 3-4 business days after your surgery.  You may call to check if you do not hear from [place] after three days. 10. OTHER INSTRUCTIONS: _______________________________________________________________________________________________ _____________________________________________________________________________________________________________________________________ _____________________________________________________________________________________________________________________________________ _____________________________________________________________________________________________________________________________________  WHEN TO CALL DR Naija Troost: 1. Fever over 101.0 2. Nausea and/or vomiting. 3. Extreme swelling or  bruising. 4. Continued bleeding from incision. 5. Increased pain, redness, or drainage from the incision.  The clinic  staff is available to answer your questions during regular business hours.  Please dont hesitate to call and ask to speak to one of the nurses for clinical concerns.  If you have a medical emergency, go to the nearest emergency room or call 911.  A surgeon from [facility] is always on call at the hospital.  For further questions, please visit [email or web address]

## 2019-10-18 NOTE — Anesthesia Preprocedure Evaluation (Addendum)
Anesthesia Evaluation  Patient identified by MRN, date of birth, ID band Patient awake    Reviewed: Allergy & Precautions, NPO status , Patient's Chart, lab work & pertinent test results  History of Anesthesia Complications Negative for: history of anesthetic complications  Airway Mallampati: II  TM Distance: >3 FB Neck ROM: Full    Dental no notable dental hx. (+) Dental Advisory Given   Pulmonary neg pulmonary ROS,    Pulmonary exam normal        Cardiovascular negative cardio ROS Normal cardiovascular exam     Neuro/Psych negative neurological ROS     GI/Hepatic negative GI ROS, Neg liver ROS,   Endo/Other  negative endocrine ROS  Renal/GU negative Renal ROS     Musculoskeletal negative musculoskeletal ROS (+)   Abdominal   Peds  Hematology negative hematology ROS (+)   Anesthesia Other Findings Day of surgery medications reviewed with the patient.  Reproductive/Obstetrics                            Anesthesia Physical Anesthesia Plan  ASA: II  Anesthesia Plan: General   Post-op Pain Management:  Regional for Post-op pain   Induction:   PONV Risk Score and Plan: 3 and Ondansetron, Dexamethasone and Scopolamine patch - Pre-op  Airway Management Planned: LMA  Additional Equipment:   Intra-op Plan:   Post-operative Plan: Extubation in OR  Informed Consent: I have reviewed the patients History and Physical, chart, labs and discussed the procedure including the risks, benefits and alternatives for the proposed anesthesia with the patient or authorized representative who has indicated his/her understanding and acceptance.     Dental advisory given  Plan Discussed with: CRNA, Anesthesiologist and Surgeon  Anesthesia Plan Comments:        Anesthesia Quick Evaluation

## 2019-10-18 NOTE — H&P (Signed)
Tabitha Brown is an 38 y.o. female.   Chief Complaint: breast cancer HPI: 50 yof referred by Dr Lindi Adie for new right breast cancer. she has 38 year old prior retropec implants. she has no prior breast history and no fh. genetic testing is negative. she has c density breasts on mm. she palpated this mass in may and had a mm in june that showed the mass. this was on [place] 5x3x4 mm in size. it is about 3 mm from implant. it was thought to be benign. she then saw a Psychiatric nurse who sent for mri to rule out rupture due to some breast pain and the mri showed a 6x4x5 mm mass with no other abnormalities with implants, breasts or nodes. she had [place] of nodes that is negative. biopsy of the lesion is a grade III IDC that is er/pr positive, her 2 negative and Ki is 10%. she is here with her husband to discuss options (he is a Psychiatrist here at [facility]). 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 Breast mass 07/26/2017   Right breast lump x 1 month   . Eating disorder   . Family history of cholangiocarcinoma   . right breast ca    diagnosed 09/18/19  . UTI (urinary tract infection)     Past Surgical History:  Procedure Laterality Date  . APPENDECTOMY    . AUGMENTATION MAMMAPLASTY Bilateral 2010  . BREAST AUGMENTATION  2010  . CESAREAN SECTION     pt denies this-states she had 2 vaginal births    Family History  Problem Relation Age of Onset  . Alcohol abuse Maternal Grandfather        bile duct cancer  . Heart disease Maternal Grandfather   . Cancer Maternal Grandfather 89       bile duct cancer  . Hypertension Paternal Grandmother    Social History:  reports that she has never smoked. She has never used smokeless tobacco. She reports current alcohol use. She reports that she does not use drugs.  Allergies:  Allergies  Allergen Reactions  . Sulfa Antibiotics Hives  . Sulfonylureas     No medications prior to admission.    No results  found for this or any previous visit (from the past 48 hour(s)). Nm Bone Scan Whole Body  Result Date: 10/16/2019 CLINICAL DATA:  RIGHT breast cancer ER positive with biopsy done 09/17/2019, complaining of RIGHT shoulder pain down arm since surgery EXAM: NUCLEAR MEDICINE WHOLE BODY BONE SCAN TECHNIQUE: Whole body anterior and posterior images were obtained approximately 3 hours after intravenous injection of radiopharmaceutical. RADIOPHARMACEUTICALS:  20.7 mCi Technetium-20mMDP IV COMPARISON:  None Correlation: CT chest abdomen pelvis 10/16/2019 FINDINGS: Focal uptake at the manubriosternal junction likely physiologic. Minimal uptake at the base of the xiphoid process, also likely physiologic. No worrisome sites of abnormal osseous tracer accumulation are seen to suggest osseous metastatic disease. Expected urinary tract and soft tissue distribution of tracer. IMPRESSION: No scintigraphic evidence of osseous metastatic disease. Electronically Signed   By: MLavonia DanaM.D.   On: 10/16/2019 12:01   [place] Radioactive Seed Loc  Result Date: 10/17/2019 CLINICAL DATA:  38year old female for seed localization of RIGHT breast cancer prior to RIGHT lumpectomy EXAM: ULTRASOUND GUIDED RADIOACTIVE SEED LOCALIZATION OF THE RIGHT BREAST RIGHT MAMMOGRAM POST RADIOACTIVE SEED PLACEMENT. COMPARISON:  Previous exam(s). FINDINGS: Patient presents for radioactive seed localization prior to RIGHT lumpectomy. I met with the  patient and we discussed the procedure of seed localization including benefits and alternatives. We discussed the high likelihood of a successful procedure. We discussed the risks of the procedure including infection, bleeding, tissue injury, implant damage and further surgery. We discussed the low dose of radioactivity involved in the procedure. Informed, written consent was given. The usual time-out protocol was performed immediately prior to the procedure. Using ultrasound guidance, sterile technique, 1%  lidocaine and an I-125 radioactive seed, the mass/clip was localized using a LATERAL approach with the seed placed immediately adjacent to the mass/clip. The seed lies 0.9 mm anterior to the biopsy clip. The follow-up mammogram images confirm the seed in the expected location and were marked for Dr. Donne Hazel. Follow-up survey of the patient confirms presence of the radioactive seed. Order number of I-125 seed:  [id number]. Total activity:  0.350 millicuries.  Reference Date: 10/08/2019 The patient tolerated the procedure well and was released from the [place]. She was given instructions regarding seed removal. IMPRESSION: Radioactive seed localization RIGHT breast. No apparent complications. Electronically Signed   By: Margarette Canada M.D.   On: 10/17/2019 15:51   Mm Clip Placement Right  Result Date: 10/17/2019 CLINICAL DATA:  38 year old female for seed localization of RIGHT breast cancer prior to RIGHT lumpectomy EXAM: ULTRASOUND GUIDED RADIOACTIVE SEED LOCALIZATION OF THE RIGHT BREAST RIGHT MAMMOGRAM POST RADIOACTIVE SEED PLACEMENT. COMPARISON:  Previous exam(s). FINDINGS: Patient presents for radioactive seed localization prior to RIGHT lumpectomy. I met with the patient and we discussed the procedure of seed localization including benefits and alternatives. We discussed the high likelihood of a successful procedure. We discussed the risks of the procedure including infection, bleeding, tissue injury, implant damage and further surgery. We discussed the low dose of radioactivity involved in the procedure. Informed, written consent was given. The usual time-out protocol was performed immediately prior to the procedure. Using ultrasound guidance, sterile technique, 1% lidocaine and an I-125 radioactive seed, the mass/clip was localized using a LATERAL approach with the seed placed immediately adjacent to the mass/clip. The seed lies 0.9 mm anterior to the biopsy clip. The follow-up mammogram images confirm  the seed in the expected location and were marked for Dr. Donne Hazel. Follow-up survey of the patient confirms presence of the radioactive seed. Order number of I-125 seed:  [id number]. Total activity:  3.716 millicuries.  Reference Date: 10/08/2019 The patient tolerated the procedure well and was released from the [place]. She was given instructions regarding seed removal. IMPRESSION: Radioactive seed localization RIGHT breast. No apparent complications. Electronically Signed   By: Margarette Canada M.D.   On: 10/17/2019 15:51    Review of Systems  All other systems reviewed and are negative.   Last menstrual period 09/20/2019. Physical Exam  Physical Exam Rolm Bookbinder MD; 10/05/2019 10:00 AM) General Mental Status-Alert. Orientation-Oriented X3. Breast Nipples-No Discharge. Note: small 5 mm medial right breast mass with associated hematoma bilateral implants in place Lymphatic Head & Neck General Head & Neck Lymphatics: Bilateral - Description - Normal. Axillary General Axillary Region: Bilateral - Description - Normal. Note: no [place] adenopathy  Assessment/Plan BREAST CANCER OF LOWER-INNER QUADRANT OF RIGHT FEMALE BREAST (C50.311) Story: right breast seed guided lumpectomy, right axillary sn biopsy We discussed the staging and pathophysiology of breast cancer. We discussed all of the different options for treatment for breast cancer including surgery, chemotherapy, radiation therapy, Herceptin, and antiestrogen therapy. We discussed a sentinel lymph node biopsy as she does not appear to having lymph node involvement right now. We  discussed the performance of that with injection of radioactive tracer. We discussed that there is a chance of having a positive node with a sentinel lymph node biopsy and we will await the permanent pathology to make any other first further decisions in terms of her treatment. We discussed up to a 5% risk lifetime of chronic shoulder pain as well as  lymphedema associated with a sentinel lymph node biopsy. We discussed the options for treatment of the breast cancer which included lumpectomy versus a mastectomy. We discussed the performance of the lumpectomy with radioactive seed placement. We discussed a 5-10% chance of a positive margin requiring reexcision in the operating room. We also discussed that she will need radiation therapy if she undergoes lumpectomy. The breast cannot undergo more radiation therapy in the same breast after lumpectomy in the future. We discussed mastectomy and the postoperative care for that as well. Mastectomy can be followed by reconstruction. The decision for lumpectomy vs mastectomy has no impact on decision for chemotherapy. Most mastectomy patients will not need radiation therapy. We discussed that there is no difference in her survival whether she undergoes lumpectomy with radiation therapy or antiestrogen therapy versus a mastectomy. There is also no real difference between her recurrence in the breast. We discussed the risks of operation including bleeding, infection, possible reoperation. She understands her further therapy will be based on what her stages at the time of her operation.  Rolm Bookbinder, MD 10/18/2019, 8:03 AM

## 2019-10-18 NOTE — Interval H&P Note (Signed)
History and Physical Interval Note:  10/18/2019 10:34 AM  Tabitha Brown  has presented today for surgery, with the diagnosis of RIGHT BREAST CANCER.  The various methods of treatment have been discussed with the patient and family. After consideration of risks, benefits and other options for treatment, the patient has consented to  Procedure(s): RIGHT BREAST LUMPECTOMY WITH RADIOACTIVE SEED AND RIGHT AXILLARY SENTINEL LYMPH NODE BIOPSY (Right) as a surgical intervention.  The patient's history has been reviewed, patient examined, no change in status, stable for surgery.  I have reviewed the patient's chart and labs.  Questions were answered to the patient's satisfaction.     Rolm Bookbinder

## 2019-10-18 NOTE — Anesthesia Procedure Notes (Signed)
Procedure Name: LMA Insertion Date/Time: 10/18/2019 11:04 AM Performed by: Janace Litten, CRNA Pre-anesthesia Checklist: Patient identified, Emergency Drugs available, Suction available and Patient being monitored Patient Re-evaluated:Patient Re-evaluated prior to induction Oxygen Delivery Method: Circle System Utilized Preoxygenation: Pre-oxygenation with 100% oxygen Induction Type: IV induction Ventilation: Mask ventilation without difficulty LMA: LMA inserted LMA Size: 3.0 Number of attempts: 1 Placement Confirmation: positive ETCO2 Tube secured with: Tape Dental Injury: Teeth and Oropharynx as per pre-operative assessment

## 2019-10-18 NOTE — Anesthesia Procedure Notes (Signed)
Anesthesia Regional Block: Pectoralis block   Pre-Anesthetic Checklist: ,, timeout performed, Correct Patient, Correct Site, Correct Laterality, Correct Procedure, Correct Position, site marked, Risks and benefits discussed,  Surgical consent,  Pre-op evaluation,  At surgeon's request and post-op pain management  Laterality: Right  Prep: chloraprep       Needles:  Injection technique: Single-shot  Needle Type: Echogenic Stimulator Needle     Needle Length: 10cm  Needle Gauge: 21     Additional Needles:   Narrative:  Start time: 10/18/2019 10:21 AM End time: 10/18/2019 10:31 AM Injection made incrementally with aspirations every 5 mL.  Performed by: Personally

## 2019-10-19 ENCOUNTER — Other Ambulatory Visit: Payer: Self-pay | Admitting: *Deleted

## 2019-10-19 ENCOUNTER — Encounter (HOSPITAL_COMMUNITY): Payer: Self-pay | Admitting: General Surgery

## 2019-10-19 DIAGNOSIS — R9389 Abnormal findings on diagnostic imaging of other specified body structures: Secondary | ICD-10-CM

## 2019-10-19 DIAGNOSIS — C50311 Malignant neoplasm of lower-inner quadrant of right female breast: Secondary | ICD-10-CM

## 2019-10-19 NOTE — Anesthesia Postprocedure Evaluation (Signed)
Anesthesia Post Note  Patient: Tabitha Brown  Procedure(s) Performed: RIGHT BREAST LUMPECTOMY WITH RADIOACTIVE SEED AND RIGHT AXILLARY SENTINEL LYMPH NODE BIOPSY (Right Breast)     Patient location during evaluation: PACU Anesthesia Type: General Level of consciousness: sedated Pain management: pain level controlled Vital Signs Assessment: post-procedure vital signs reviewed and stable Respiratory status: spontaneous breathing and respiratory function stable Cardiovascular status: stable Postop Assessment: no apparent nausea or vomiting Anesthetic complications: yes Anesthetic complication details: PONV   Last Vitals:  Vitals:   10/18/19 1300 10/18/19 1330  BP: (!) 94/52 (!) (P) 94/53  Pulse: (!) 103 (P) 100  Resp: 11 (P) 14  Temp: (!) 36.3 C   SpO2: 97% (P) 100%    Last Pain:  Vitals:   10/18/19 1309  TempSrc:   PainSc: 0-No pain                 Siana Panameno DANIEL

## 2019-10-24 ENCOUNTER — Other Ambulatory Visit: Payer: Self-pay

## 2019-10-24 ENCOUNTER — Ambulatory Visit
Admission: RE | Admit: 2019-10-24 | Discharge: 2019-10-24 | Disposition: A | Discharge status: Home / Self Care | Payer: BC Managed Care – PPO | Source: Ambulatory Visit | Attending: Hematology and Oncology | Admitting: Hematology and Oncology

## 2019-10-24 DIAGNOSIS — C50911 Malignant neoplasm of unspecified site of right female breast: Secondary | ICD-10-CM | POA: Diagnosis not present

## 2019-10-24 DIAGNOSIS — R590 Localized enlarged lymph nodes: Secondary | ICD-10-CM | POA: Diagnosis not present

## 2019-10-24 DIAGNOSIS — C50311 Malignant neoplasm of lower-inner quadrant of right female breast: Secondary | ICD-10-CM

## 2019-10-24 DIAGNOSIS — R9389 Abnormal findings on diagnostic imaging of other specified body structures: Secondary | ICD-10-CM

## 2019-10-24 DIAGNOSIS — Z170 Estrogen receptor positive status [ER+]: Secondary | ICD-10-CM

## 2019-10-24 NOTE — Progress Notes (Signed)
Patient Care Team: [place], Betty G, MD as PCP - General (Family Medicine) Mauro Kaufmann, RN as Oncology Nurse Navigator Rockwell Germany, RN as Oncology Nurse Navigator Erroll Luna, MD as Consulting Physician (General Surgery) Nicholas Lose, MD as Consulting Physician (Hematology and Oncology) Gery Pray, MD as Consulting Physician (Radiation Oncology)  DIAGNOSIS:    ICD-10-CM   1. Malignant neoplasm of lower-inner quadrant of right breast of female, estrogen receptor positive ([place])  C50.311    Z17.0     SUMMARY OF ONCOLOGIC HISTORY: Oncology History  Malignant neoplasm of lower-inner quadrant of right breast of female, estrogen receptor positive ([place])  09/19/2019 Initial Diagnosis   Patient palpated a right breast mass. Mammogram on 06/05/19 showed a probably benign right breast mass at the 4 o'clock position measuring 0.5cm. Breast MRI on 09/05/19 showed a right breast mass at the 4 o'clock position with benign morphology. Biopsy showed IDC, grade 3, HER-2 - by FISH, ER+ 95%, PR+ 95%, Ki67 10%.     09/26/2019 Cancer Staging   Staging form: Breast, AJCC 8th Edition - Clinical stage from 09/26/2019: Stage IA (cT1b, cN0, cM0, G3, ER+, PR+, HER2-) - Signed by Nicholas Lose, MD on 09/26/2019   10/03/2019 Genetic Testing   Negative genetic testing:  No pathogenic variants detected on the [facility] Breast Cancer STAT panel with reflex to the Common Hereditary Cancers panel. A variant of uncertain significance was detected in the POLE gene, called c.6299C>T. The report date is 10/03/2019.   The STAT Breast cancer panel offered by [facility] includes sequencing and rearrangement analysis for the following 9 genes:  ATM, BRCA1, BRCA2, CDH1, CHEK2, PALB2, PTEN, STK11 and TP53.  The Common Hereditary Cancers Panel offered by [facility] includes sequencing and/or deletion duplication testing of the following 48 genes: APC, ATM, AXIN2, BARD1, BMPR1A, BRCA1, BRCA2, BRIP1, CDH1, CDK4, CDKN2A (p14ARF),  CDKN2A (p16INK4a), CHEK2, CTNNA1, DICER1, EPCAM (Deletion/duplication testing only), GREM1 (promoter region deletion/duplication testing only), KIT, MEN1, MLH1, MSH2, MSH3, MSH6, MUTYH, NBN, NF1, NHTL1, PALB2, PDGFRA, PMS2, POLD1, POLE, PTEN, RAD50, RAD51C, RAD51D, RNF43, SDHB, SDHC, SDHD, SMAD4, SMARCA4. STK11, TP53, TSC1, TSC2, and VHL.  The following genes were evaluated for sequence changes only: SDHA and HOXB13 c.251G>A variant only.    10/18/2019 Surgery   Right lumpectomy: Intermediate grade DCIS, 1.8 cm, LCIS, DCIS broadly present of the medial margin and less than 0.1 cm to anterior inferior posterior and superior margins.  0/3 lymph nodes negative.  No invasive cancer detected on the lumpectomy.  Based on the biopsy tumor size 0.6 cm ER 95%, PR 95%, Ki-67 10%, HER-2 negative     CHIEF COMPLIANT: Follow-up s/p lumpectomy to review pathology  INTERVAL HISTORY: Tabitha Brown is a 38 y.o. with above-mentioned history of right breast cancer. Genetic testing was negative. She underwent a lumpectomy with Dr. Donne Hazel on 10/18/19 for which pathology showed ductal carcinoma in situ, intermediate grade, 1.8cm, clear margins, and 3 right axillary lymph nodes negative for carcinoma. US of the left axilla on 10/24/19 showed normal lymph nodes. She presents to the clinic today to review the pathology report and further treatment.   REVIEW OF SYSTEMS:   Constitutional: Denies fevers, chills or abnormal weight loss Eyes: Denies blurriness of vision Ears, nose, mouth, throat, and face: Denies mucositis or sore throat Respiratory: Denies cough, dyspnea or wheezes Cardiovascular: Denies palpitation, chest discomfort Gastrointestinal: Denies nausea, heartburn or change in bowel habits Skin: Denies abnormal skin rashes Lymphatics: Denies new lymphadenopathy or easy bruising Neurological: Denies numbness,  tingling or new weaknesses Behavioral/Psych: Mood is stable, no new changes  Extremities: No lower  extremity edema Breast: s/p right lumpectomy  All other systems were reviewed with the patient and are negative.  I have reviewed the past medical history, past surgical history, social history and family history with the patient and they are unchanged from previous note.  ALLERGIES:  is allergic to sulfa antibiotics and sulfonylureas.  MEDICATIONS:  Current Outpatient Medications  Medication Sig Dispense Refill  . Ascorbic Acid (VITAMIN C PO) Take 1 tablet by mouth daily.     . cetirizine (ZYRTEC) 10 MG tablet Take 5 mg by mouth daily as needed for allergies.    . COLLAGEN PO Take 1 Scoop by mouth daily.    . cyclobenzaprine (FLEXERIL) 10 MG tablet Take 0.5-1 tablets (5-10 mg total) by mouth at bedtime. (Patient taking differently: Take 5 mg by mouth at bedtime as needed for muscle spasms. ) 30 tablet 1  . oxyCODONE (OXY IR/ROXICODONE) 5 MG immediate release tablet Take 1 tablet (5 mg total) by mouth every 6 (six) hours as needed for moderate pain, severe pain or breakthrough pain. 10 tablet 0  . Prenatal Vit-Fe Fumarate-FA (PRENATAL MULTIVITAMIN) TABS tablet Take 1 tablet by mouth daily at 12 noon.    Marland Kitchen PROBIOTIC PRODUCT PO Take 1 capsule by mouth daily.     Marland Kitchen VITAMIN D, ERGOCALCIFEROL, PO Take 1 tablet by mouth daily.     Marland Kitchen zinc gluconate 50 MG tablet Take 50 mg by mouth daily.     No current facility-administered medications for this visit.     PHYSICAL EXAMINATION: ECOG PERFORMANCE STATUS: 1 - Symptomatic but completely ambulatory  Vitals:   10/25/19 1442  BP: 134/75  Pulse: 84  Resp: 17  Temp: 98.2 F (36.8 C)  SpO2: 100%   Filed Weights   10/25/19 1442  Weight: 104 lb 1.6 oz (47.2 kg)    GENERAL: alert, no distress and comfortable SKIN: skin color, texture, turgor are normal, no rashes or significant lesions EYES: normal, Conjunctiva are pink and non-injected, sclera clear OROPHARYNX: no exudate, no erythema and lips, buccal mucosa, and tongue normal  NECK: supple,  thyroid normal size, non-tender, without nodularity LYMPH: no palpable lymphadenopathy in the cervical, axillary or inguinal LUNGS: clear to auscultation and percussion with normal breathing effort HEART: regular rate & rhythm and no murmurs and no lower extremity edema ABDOMEN: abdomen soft, non-tender and normal bowel sounds MUSCULOSKELETAL: no cyanosis of digits and no clubbing  NEURO: alert & oriented x 3 with fluent speech, no focal motor/sensory deficits EXTREMITIES: No lower extremity edema  LABORATORY DATA:  I have reviewed the data as listed CMP Latest Ref Rng & Units 09/26/2019 01/31/2018  Glucose 70 - 99 mg/dL 91 80  BUN 6 - 20 mg/dL 9 10  Creatinine 0.44 - 1.00 mg/dL 0.86 0.73  Sodium 135 - 145 mmol/L 142 140  Potassium 3.5 - 5.1 mmol/L 3.8 4.0  Chloride 98 - 111 mmol/L 105 104  CO2 22 - 32 mmol/L 27 30  Calcium 8.9 - 10.3 mg/dL 9.2 9.4  Total Protein 6.5 - 8.1 g/dL 7.1 -  Total Bilirubin 0.3 - 1.2 mg/dL 0.3 -  Alkaline Phos 38 - 126 U/L 59 -  AST 15 - 41 U/L 19 -  ALT 0 - 44 U/L 19 -    Lab Results  Component Value Date   WBC 4.5 10/12/2019   HGB 14.7 10/12/2019   HCT 42.7 10/12/2019   MCV  91.8 10/12/2019   PLT 200 10/12/2019   NEUTROABS 2.8 09/26/2019    ASSESSMENT & PLAN:  Malignant neoplasm of lower-inner quadrant of right breast of female, estrogen receptor positive ([place]) 10/18/2019:Right lumpectomy: Intermediate grade DCIS, 1.8 cm, LCIS, DCIS broadly present of the medial margin and less than 0.1 cm to anterior inferior posterior and superior margins.  0/3 lymph nodes negative.  No invasive cancer detected on the lumpectomy.  Based on the biopsy tumor size 0.6 cm ER 95%, PR 95%, Ki-67 10%, HER-2 negative  T1BN0 stage Ia  Treatment plan: 1.  Resection of margins 2. Oncotype DX testing to determine if chemotherapy on the biopsy specimen. 2. Adjuvant radiation therapy followed by 3. Adjuvant antiestrogen therapy: We discussed tamoxifen versus ovarian  function suppression and aromatase inhibitor therapy.  Patient is very concerned about the risk of uterine cancer from tamoxifen.  She wanted to know if she should undergo hysterectomy and bilateral salpingo-oophorectomy.  I asked her to wait for the Oncotype DX result to decide if she is high risk or not.  If she is high risk then we will consider complete estrogen blockade for her adjuvant treatment.  Patient's husband know someone who wants to get her a second opinion at [facility].  We will provide her with any information to enable the second opinion consult.  Return to clinic based upon Oncotype DX test results.    No orders of the defined types were placed in this encounter.  The patient has a good understanding of the overall plan. she agrees with it. she will call with any problems that may develop before the next visit here.  Nicholas Lose, MD 10/25/2019  Julious Oka Dorshimer am acting as scribe for Dr. Nicholas Lose.  I have reviewed the above documentation for accuracy and completeness, and I agree with the above.

## 2019-10-25 ENCOUNTER — Inpatient Hospital Stay: Payer: BC Managed Care – PPO | Attending: Hematology and Oncology | Admitting: Hematology and Oncology

## 2019-10-25 ENCOUNTER — Other Ambulatory Visit: Payer: Self-pay

## 2019-10-25 DIAGNOSIS — Z882 Allergy status to sulfonamides status: Secondary | ICD-10-CM | POA: Insufficient documentation

## 2019-10-25 DIAGNOSIS — C50311 Malignant neoplasm of lower-inner quadrant of right female breast: Secondary | ICD-10-CM | POA: Insufficient documentation

## 2019-10-25 DIAGNOSIS — Z170 Estrogen receptor positive status [ER+]: Secondary | ICD-10-CM | POA: Diagnosis not present

## 2019-10-25 DIAGNOSIS — Z79899 Other long term (current) drug therapy: Secondary | ICD-10-CM | POA: Insufficient documentation

## 2019-10-25 NOTE — Assessment & Plan Note (Signed)
10/18/2019:Right lumpectomy: Intermediate grade DCIS, 1.8 cm, LCIS, DCIS broadly present of the medial margin and less than 0.1 cm to anterior inferior posterior and superior margins.  0/3 lymph nodes negative.  No invasive cancer detected on the lumpectomy.  Based on the biopsy tumor size 0.6 cm ER 95%, PR 95%, Ki-67 10%, HER-2 negative  T1BN0 stage Ia  Treatment plan: 1. Oncotype DX testing to determine if chemotherapy would be of any benefit followed by 2. Adjuvant radiation therapy followed by 3. Adjuvant antiestrogen therapy  Return to clinic based upon Oncotype DX test results.

## 2019-10-26 ENCOUNTER — Telehealth: Payer: Self-pay | Admitting: *Deleted

## 2019-10-26 ENCOUNTER — Other Ambulatory Visit: Payer: Self-pay | Admitting: General Surgery

## 2019-10-26 NOTE — Telephone Encounter (Signed)
Received order for oncotype testing on CORE bx. Requisition faxed to [facility] and pathology

## 2019-10-29 ENCOUNTER — Encounter (HOSPITAL_BASED_OUTPATIENT_CLINIC_OR_DEPARTMENT_OTHER): Payer: Self-pay | Admitting: *Deleted

## 2019-10-29 ENCOUNTER — Other Ambulatory Visit (HOSPITAL_COMMUNITY)
Admission: RE | Admit: 2019-10-29 | Discharge: 2019-10-29 | Disposition: A | Discharge status: Home / Self Care | Payer: BC Managed Care – PPO | Source: Ambulatory Visit | Attending: General Surgery | Admitting: General Surgery

## 2019-10-29 ENCOUNTER — Other Ambulatory Visit: Payer: Self-pay

## 2019-10-29 DIAGNOSIS — Z01812 Encounter for preprocedural laboratory examination: Secondary | ICD-10-CM | POA: Diagnosis not present

## 2019-10-29 DIAGNOSIS — Z20828 Contact with and (suspected) exposure to other viral communicable diseases: Secondary | ICD-10-CM | POA: Insufficient documentation

## 2019-10-30 LAB — NOVEL CORONAVIRUS, NAA (HOSP ORDER, SEND-OUT TO REF LAB; TAT 18-24 HRS): SARS-CoV-2, NAA: NOT DETECTED

## 2019-11-01 ENCOUNTER — Other Ambulatory Visit: Payer: Self-pay

## 2019-11-01 ENCOUNTER — Encounter (HOSPITAL_BASED_OUTPATIENT_CLINIC_OR_DEPARTMENT_OTHER): Payer: Self-pay | Admitting: *Deleted

## 2019-11-01 ENCOUNTER — Encounter (HOSPITAL_BASED_OUTPATIENT_CLINIC_OR_DEPARTMENT_OTHER)
Admission: RE | Disposition: A | Discharge status: Home / Self Care | Payer: Self-pay | Source: Home / Self Care | Attending: General Surgery

## 2019-11-01 ENCOUNTER — Ambulatory Visit (HOSPITAL_BASED_OUTPATIENT_CLINIC_OR_DEPARTMENT_OTHER)
Admission: RE | Admit: 2019-11-01 | Discharge: 2019-11-01 | Disposition: A | Discharge status: Home / Self Care | Payer: BC Managed Care – PPO | Attending: General Surgery | Admitting: General Surgery

## 2019-11-01 ENCOUNTER — Encounter (HOSPITAL_BASED_OUTPATIENT_CLINIC_OR_DEPARTMENT_OTHER): Payer: Self-pay | Admitting: Anesthesiology

## 2019-11-01 DIAGNOSIS — Z3201 Encounter for pregnancy test, result positive: Secondary | ICD-10-CM | POA: Diagnosis not present

## 2019-11-01 DIAGNOSIS — Z5309 Procedure and treatment not carried out because of other contraindication: Secondary | ICD-10-CM | POA: Insufficient documentation

## 2019-11-01 HISTORY — DX: Nausea with vomiting, unspecified: R11.2

## 2019-11-01 HISTORY — DX: Other specified postprocedural states: Z98.890

## 2019-11-01 HISTORY — PX: RE-EXCISION OF BREAST CANCER,SUPERIOR MARGINS: SHX6047

## 2019-11-01 LAB — POCT PREGNANCY, URINE: Preg Test, Ur: POSITIVE — AB

## 2019-11-01 SURGERY — RE-EXCISION OF BREAST CANCER,SUPERIOR MARGINS
Anesthesia: General | Site: Breast | Laterality: Right

## 2019-11-01 MED ORDER — ACETAMINOPHEN 500 MG PO TABS
ORAL_TABLET | ORAL | Status: AC
  Filled 2019-11-01: qty 2

## 2019-11-01 MED ORDER — CEFAZOLIN SODIUM-DEXTROSE 2-4 GM/100ML-% IV SOLN: 2.0000 g | INTRAVENOUS | Status: DC

## 2019-11-01 MED ORDER — CHLORHEXIDINE GLUCONATE CLOTH 2 % EX PADS: 6.0000 | MEDICATED_PAD | Freq: Once | CUTANEOUS | Status: DC

## 2019-11-01 MED ORDER — LACTATED RINGERS IV SOLN
INTRAVENOUS | Status: DC
  Administered 2019-11-01: 13:00:00 via INTRAVENOUS

## 2019-11-01 MED ORDER — KETOROLAC TROMETHAMINE 15 MG/ML IJ SOLN
INTRAMUSCULAR | Status: AC
  Filled 2019-11-01: qty 1

## 2019-11-01 MED ORDER — ACETAMINOPHEN 500 MG PO TABS
1000.0000 mg | ORAL_TABLET | ORAL | Status: AC
  Administered 2019-11-01: 1000 mg via ORAL

## 2019-11-01 MED ORDER — GABAPENTIN 100 MG PO CAPS
ORAL_CAPSULE | ORAL | Status: AC
  Filled 2019-11-01: qty 1

## 2019-11-01 MED ORDER — CEFAZOLIN SODIUM-DEXTROSE 2-4 GM/100ML-% IV SOLN
INTRAVENOUS | Status: AC
  Filled 2019-11-01: qty 100

## 2019-11-01 MED ORDER — KETOROLAC TROMETHAMINE 15 MG/ML IJ SOLN
15.0000 mg | INTRAMUSCULAR | Status: AC
  Administered 2019-11-01: 15 mg via INTRAVENOUS

## 2019-11-01 MED ORDER — ENSURE PRE-SURGERY PO LIQD: 296.0000 mL | Freq: Once | ORAL | Status: DC

## 2019-11-01 MED ORDER — GABAPENTIN 100 MG PO CAPS
100.0000 mg | ORAL_CAPSULE | ORAL | Status: AC
  Administered 2019-11-01: 100 mg via ORAL

## 2019-11-01 SURGICAL SUPPLY — 51 items
BINDER BREAST LRG (GAUZE/BANDAGES/DRESSINGS) IMPLANT
BINDER BREAST MEDIUM (GAUZE/BANDAGES/DRESSINGS) IMPLANT
BINDER BREAST XLRG (GAUZE/BANDAGES/DRESSINGS) IMPLANT
BINDER BREAST XXLRG (GAUZE/BANDAGES/DRESSINGS) IMPLANT
BLADE SURG 15 STRL LF DISP TIS (BLADE) ×1 IMPLANT
BLADE SURG 15 STRL SS (BLADE) ×1
CANISTER SUCT 1200ML W/VALVE (MISCELLANEOUS) ×2 IMPLANT
CHLORAPREP W/TINT 26 (MISCELLANEOUS) ×2 IMPLANT
CLIP VESOCCLUDE SM WIDE 6/CT (CLIP) IMPLANT
COVER BACK TABLE REUSABLE LG (DRAPES) ×2 IMPLANT
COVER MAYO STAND REUSABLE (DRAPES) ×2 IMPLANT
COVER WAND RF STERILE (DRAPES) IMPLANT
DECANTER SPIKE VIAL GLASS SM (MISCELLANEOUS) IMPLANT
DERMABOND ADVANCED (GAUZE/BANDAGES/DRESSINGS) ×1
DERMABOND ADVANCED .7 DNX12 (GAUZE/BANDAGES/DRESSINGS) ×1 IMPLANT
DRAPE LAPAROSCOPIC ABDOMINAL (DRAPES) ×2 IMPLANT
DRAPE UTILITY XL STRL (DRAPES) ×2 IMPLANT
DRSG TEGADERM 4X4.75 (GAUZE/BANDAGES/DRESSINGS) IMPLANT
ELECT COATED BLADE 2.86 ST (ELECTRODE) ×2 IMPLANT
ELECT REM PT RETURN 9FT ADLT (ELECTROSURGICAL) ×2
ELECTRODE REM PT RTRN 9FT ADLT (ELECTROSURGICAL) ×1 IMPLANT
GAUZE SPONGE 4X4 12PLY STRL LF (GAUZE/BANDAGES/DRESSINGS) IMPLANT
GLOVE BIO SURGEON STRL SZ7 (GLOVE) ×2 IMPLANT
GLOVE BIOGEL PI IND STRL 7.5 (GLOVE) ×1 IMPLANT
GLOVE BIOGEL PI INDICATOR 7.5 (GLOVE) ×1
GOWN STRL REUS W/ TWL LRG LVL3 (GOWN DISPOSABLE) ×2 IMPLANT
GOWN STRL REUS W/TWL LRG LVL3 (GOWN DISPOSABLE) ×2
ILLUMINATOR WAVEGUIDE N/F (MISCELLANEOUS) IMPLANT
LIGHT WAVEGUIDE WIDE FLAT (MISCELLANEOUS) IMPLANT
NEEDLE HYPO 25X1 1.5 SAFETY (NEEDLE) ×2 IMPLANT
NS IRRIG 1000ML POUR BTL (IV SOLUTION) IMPLANT
PACK BASIN DAY SURGERY FS (CUSTOM PROCEDURE TRAY) ×2 IMPLANT
PENCIL SMOKE EVACUATOR (MISCELLANEOUS) IMPLANT
SLEEVE SCD COMPRESS KNEE MED (MISCELLANEOUS) ×2 IMPLANT
SPONGE LAP 4X18 RFD (DISPOSABLE) ×2 IMPLANT
STRIP CLOSURE SKIN 1/2X4 (GAUZE/BANDAGES/DRESSINGS) ×2 IMPLANT
SUT MNCRL AB 3-0 PS2 18 (SUTURE) IMPLANT
SUT MNCRL AB 4-0 PS2 18 (SUTURE) IMPLANT
SUT MON AB 5-0 PS2 18 (SUTURE) IMPLANT
SUT SILK 2 0 SH (SUTURE) IMPLANT
SUT VIC AB 2-0 SH 27 (SUTURE)
SUT VIC AB 2-0 SH 27XBRD (SUTURE) IMPLANT
SUT VIC AB 3-0 SH 27 (SUTURE)
SUT VIC AB 3-0 SH 27X BRD (SUTURE) IMPLANT
SUT VIC AB 5-0 PS2 18 (SUTURE) IMPLANT
SUT VICRYL AB 3 0 TIES (SUTURE) IMPLANT
SYR CONTROL 10ML LL (SYRINGE) ×2 IMPLANT
TOWEL GREEN STERILE FF (TOWEL DISPOSABLE) ×2 IMPLANT
TRAY FAXITRON CT DISP (TRAY / TRAY PROCEDURE) IMPLANT
TUBE CONNECTING 20X1/4 (TUBING) ×2 IMPLANT
YANKAUER SUCT BULB TIP NO VENT (SUCTIONS) ×2 IMPLANT

## 2019-11-01 NOTE — Progress Notes (Signed)
DOS urine preg test is positive. Dr Lanetta Inch (anesthesia) notified. She and Dr Donne Hazel spoke to patient at length. Surgery today is cancelled. She will follow up with Dr Donne Hazel for treatment options. PIV removed and pt changed clothes. She called her husband and he will come pick her up.

## 2019-11-02 DIAGNOSIS — O09519 Supervision of elderly primigravida, unspecified trimester: Secondary | ICD-10-CM | POA: Diagnosis not present

## 2019-11-02 DIAGNOSIS — Z3201 Encounter for pregnancy test, result positive: Secondary | ICD-10-CM | POA: Diagnosis not present

## 2019-11-02 DIAGNOSIS — O3680X0 Pregnancy with inconclusive fetal viability, not applicable or unspecified: Secondary | ICD-10-CM | POA: Diagnosis not present

## 2019-11-02 DIAGNOSIS — Z3A01 Less than 8 weeks gestation of pregnancy: Secondary | ICD-10-CM | POA: Diagnosis not present

## 2019-11-02 LAB — SURGICAL PATHOLOGY

## 2019-11-05 ENCOUNTER — Encounter (HOSPITAL_BASED_OUTPATIENT_CLINIC_OR_DEPARTMENT_OTHER): Payer: Self-pay | Admitting: General Surgery

## 2019-11-11 NOTE — Progress Notes (Signed)
HEMATOLOGY-ONCOLOGY DOXIMITY VISIT PROGRESS NOTE  I connected with Tabitha Brown on 11/12/2019 at 10:45 AM EST by Doximity video conference and verified that I am speaking with the correct person using two identifiers.  I discussed the limitations, risks, security and privacy concerns of performing an evaluation and management service by Doximity and the availability of in person appointments.  I also discussed with the patient that there may be a patient responsible charge related to this service. The patient expressed understanding and agreed to proceed.  Patient's Location: Home Physician Location: Clinic  CHIEF COMPLIANT: Follow-up to discuss recent pregnancy and treatment options  INTERVAL HISTORY: Tabitha Brown is a 38 y.o. female with above-mentioned history of right breast cancer treated with lumpectomy. She was planned to undergo a re-excision of positive margins in the right breast on 11/01/19, but it was canceled due to a positive urine pregnancy test. She presents over Doximity today to discuss her pregnancy and treatment options.   Oncology History  Malignant neoplasm of lower-inner quadrant of right breast of female, estrogen receptor positive ([place])  09/19/2019 Initial Diagnosis   Patient palpated a right breast mass. Mammogram on 06/05/19 showed a probably benign right breast mass at the 4 o'clock position measuring 0.5cm. Breast MRI on 09/05/19 showed a right breast mass at the 4 o'clock position with benign morphology. Biopsy showed IDC, grade 3, HER-2 - by FISH, ER+ 95%, PR+ 95%, Ki67 10%.     09/26/2019 Cancer Staging   Staging form: Breast, AJCC 8th Edition - Clinical stage from 09/26/2019: Stage IA (cT1b, cN0, cM0, G3, ER+, PR+, HER2-) - Signed by Nicholas Lose, MD on 09/26/2019   10/03/2019 Genetic Testing   Negative genetic testing:  No pathogenic variants detected on the [facility] Breast Cancer STAT panel with reflex to the Common Hereditary Cancers panel. A  variant of uncertain significance was detected in the POLE gene, called c.6299C>T. The report date is 10/03/2019.   The STAT Breast cancer panel offered by [facility] includes sequencing and rearrangement analysis for the following 9 genes:  ATM, BRCA1, BRCA2, CDH1, CHEK2, PALB2, PTEN, STK11 and TP53.  The Common Hereditary Cancers Panel offered by [facility] includes sequencing and/or deletion duplication testing of the following 48 genes: APC, ATM, AXIN2, BARD1, BMPR1A, BRCA1, BRCA2, BRIP1, CDH1, CDK4, CDKN2A (p14ARF), CDKN2A (p16INK4a), CHEK2, CTNNA1, DICER1, EPCAM (Deletion/duplication testing only), GREM1 (promoter region deletion/duplication testing only), KIT, MEN1, MLH1, MSH2, MSH3, MSH6, MUTYH, NBN, NF1, NHTL1, PALB2, PDGFRA, PMS2, POLD1, POLE, PTEN, RAD50, RAD51C, RAD51D, RNF43, SDHB, SDHC, SDHD, SMAD4, SMARCA4. STK11, TP53, TSC1, TSC2, and VHL.  The following genes were evaluated for sequence changes only: SDHA and HOXB13 c.251G>A variant only.    10/18/2019 Surgery   Right lumpectomy: Intermediate grade DCIS, 1.8 cm, LCIS, DCIS broadly present of the medial margin and less than 0.1 cm to anterior inferior posterior and superior margins.  0/3 lymph nodes negative.  No invasive cancer detected on the lumpectomy.  Based on the biopsy tumor size 0.6 cm ER 95%, PR 95%, Ki-67 10%, HER-2 negative     REVIEW OF SYSTEMS:   Constitutional: Denies fevers, chills or abnormal weight loss Eyes: Denies blurriness of vision Ears, nose, mouth, throat, and face: Denies mucositis or sore throat Respiratory: Denies cough, dyspnea or wheezes Cardiovascular: Denies palpitation, chest discomfort Gastrointestinal:  Denies nausea, heartburn or change in bowel habits Skin: Denies abnormal skin rashes Lymphatics: Denies new lymphadenopathy or easy bruising Neurological:Denies numbness, tingling or new weaknesses Behavioral/Psych: Mood is stable, no new changes  Extremities: No lower extremity edema Breast: denies any  pain or lumps or nodules in either breasts All other systems were reviewed with the patient and are negative.  Observations/Objective:  There were no vitals filed for this visit. There is no height or weight on file to calculate BMI.  I have reviewed the data as listed CMP Latest Ref Rng & Units 09/26/2019 01/31/2018  Glucose 70 - 99 mg/dL 91 80  BUN 6 - 20 mg/dL 9 10  Creatinine 0.44 - 1.00 mg/dL 0.86 0.73  Sodium 135 - 145 mmol/L 142 140  Potassium 3.5 - 5.1 mmol/L 3.8 4.0  Chloride 98 - 111 mmol/L 105 104  CO2 22 - 32 mmol/L 27 30  Calcium 8.9 - 10.3 mg/dL 9.2 9.4  Total Protein 6.5 - 8.1 g/dL 7.1 -  Total Bilirubin 0.3 - 1.2 mg/dL 0.3 -  Alkaline Phos 38 - 126 U/L 59 -  AST 15 - 41 U/L 19 -  ALT 0 - 44 U/L 19 -    Lab Results  Component Value Date   WBC 4.5 10/12/2019   HGB 14.7 10/12/2019   HCT 42.7 10/12/2019   MCV 91.8 10/12/2019   PLT 200 10/12/2019   NEUTROABS 2.8 09/26/2019      Assessment Plan:  Malignant neoplasm of lower-inner quadrant of right breast of female, estrogen receptor positive ([place]) 10/18/2019:Right lumpectomy: Intermediate grade DCIS, 1.8 cm, LCIS, DCIS broadly present of the medial margin and less than 0.1 cm to anterior inferior posterior and superior margins.  0/3 lymph nodes negative.  No invasive cancer detected on the lumpectomy.  Based on the biopsy tumor size 0.6 cm ER 95%, PR 95%, Ki-67 10%, HER-2 negative  T1BN0 stage Ia  Treatment plan: 1.  Resection of margins: Postponed because she was found to be pregnant 2. Oncotype DX testing to determine if chemotherapy on the biopsy specimen. 2. Adjuvant radiation therapy followed by 3. Adjuvant antiestrogen therapy ---------------------------------------------------------------- Unfortunately we have been back-and-forth regarding the Oncotype DX test. Apparently the authorization did not come through and genomic health has not released the results. We are hoping that by tomorrow we will have  this result so that she can take that information to her second appointment consultation at [facility].  If she needs chemotherapy then we will consider doing chemotherapy in her second trimester.  Option would be to do Adriamycin and Cytoxan.  Taxanes are contraindicated during pregnancy. Regarding the type of surgery, patient is contemplating on doing a mastectomy so that she does not have to go through radiation.  I discussed the assessment and treatment plan with the patient. The patient was provided an opportunity to ask questions and all were answered. The patient agreed with the plan and demonstrated an understanding of the instructions. The patient was advised to call back or seek an in-person evaluation if the symptoms worsen or if the condition fails to improve as anticipated.   I provided 15 minutes of face-to-face Doximity time during this encounter.    Rulon Eisenmenger, MD 11/12/2019   I, Molly Dorshimer, am acting as scribe for Nicholas Lose, MD.  I have reviewed the above documentation for accuracy and completeness, and I agree with the above.

## 2019-11-12 ENCOUNTER — Inpatient Hospital Stay (HOSPITAL_BASED_OUTPATIENT_CLINIC_OR_DEPARTMENT_OTHER): Payer: BC Managed Care – PPO | Admitting: Hematology and Oncology

## 2019-11-12 DIAGNOSIS — C50311 Malignant neoplasm of lower-inner quadrant of right female breast: Secondary | ICD-10-CM

## 2019-11-12 DIAGNOSIS — Z170 Estrogen receptor positive status [ER+]: Secondary | ICD-10-CM

## 2019-11-12 DIAGNOSIS — N6314 Unspecified lump in the right breast, lower inner quadrant: Secondary | ICD-10-CM | POA: Diagnosis not present

## 2019-11-12 NOTE — Assessment & Plan Note (Signed)
10/18/2019:Right lumpectomy: Intermediate grade DCIS, 1.8 cm, LCIS, DCIS broadly present of the medial margin and less than 0.1 cm to anterior inferior posterior and superior margins.  0/3 lymph nodes negative.  No invasive cancer detected on the lumpectomy.  Based on the biopsy tumor size 0.6 cm ER 95%, PR 95%, Ki-67 10%, HER-2 negative  T1BN0 stage Ia  Treatment plan: 1.  Resection of margins: Postponed because she was found to be pregnant 2. Oncotype DX testing to determine if chemotherapy on the biopsy specimen. 2. Adjuvant radiation therapy followed by 3. Adjuvant antiestrogen therapy ----------------------------------------------------------------

## 2019-11-14 DIAGNOSIS — C50111 Malignant neoplasm of central portion of right female breast: Secondary | ICD-10-CM | POA: Diagnosis not present

## 2019-11-14 DIAGNOSIS — C50311 Malignant neoplasm of lower-inner quadrant of right female breast: Secondary | ICD-10-CM | POA: Diagnosis not present

## 2019-11-14 DIAGNOSIS — C50911 Malignant neoplasm of unspecified site of right female breast: Secondary | ICD-10-CM | POA: Diagnosis not present

## 2019-11-14 DIAGNOSIS — Z170 Estrogen receptor positive status [ER+]: Secondary | ICD-10-CM | POA: Diagnosis not present

## 2019-11-14 DIAGNOSIS — R922 Inconclusive mammogram: Secondary | ICD-10-CM | POA: Diagnosis not present

## 2019-11-14 DIAGNOSIS — N6314 Unspecified lump in the right breast, lower inner quadrant: Secondary | ICD-10-CM | POA: Diagnosis not present

## 2019-11-15 DIAGNOSIS — Z3201 Encounter for pregnancy test, result positive: Secondary | ICD-10-CM | POA: Diagnosis not present

## 2019-11-15 DIAGNOSIS — N911 Secondary amenorrhea: Secondary | ICD-10-CM | POA: Diagnosis not present

## 2019-11-15 DIAGNOSIS — C50311 Malignant neoplasm of lower-inner quadrant of right female breast: Secondary | ICD-10-CM | POA: Diagnosis not present

## 2019-11-15 DIAGNOSIS — O219 Vomiting of pregnancy, unspecified: Secondary | ICD-10-CM | POA: Diagnosis not present

## 2019-11-16 DIAGNOSIS — C50311 Malignant neoplasm of lower-inner quadrant of right female breast: Secondary | ICD-10-CM | POA: Diagnosis not present

## 2019-11-16 DIAGNOSIS — Z20828 Contact with and (suspected) exposure to other viral communicable diseases: Secondary | ICD-10-CM | POA: Diagnosis not present

## 2019-11-20 ENCOUNTER — Encounter: Payer: Self-pay | Admitting: Family Medicine

## 2019-11-20 DIAGNOSIS — Z20828 Contact with and (suspected) exposure to other viral communicable diseases: Secondary | ICD-10-CM | POA: Diagnosis not present

## 2019-11-20 DIAGNOSIS — M791 Myalgia, unspecified site: Secondary | ICD-10-CM | POA: Diagnosis not present

## 2019-11-20 DIAGNOSIS — R5383 Other fatigue: Secondary | ICD-10-CM | POA: Diagnosis not present

## 2019-11-21 ENCOUNTER — Other Ambulatory Visit: Payer: Self-pay | Admitting: Family Medicine

## 2019-11-21 ENCOUNTER — Encounter: Payer: Self-pay | Admitting: Hematology and Oncology

## 2019-11-21 DIAGNOSIS — Z789 Other specified health status: Secondary | ICD-10-CM

## 2019-11-22 DIAGNOSIS — Z20828 Contact with and (suspected) exposure to other viral communicable diseases: Secondary | ICD-10-CM | POA: Diagnosis not present

## 2019-11-26 ENCOUNTER — Encounter (HOSPITAL_COMMUNITY): Payer: Self-pay

## 2019-11-26 DIAGNOSIS — C50111 Malignant neoplasm of central portion of right female breast: Secondary | ICD-10-CM | POA: Diagnosis not present

## 2019-11-26 DIAGNOSIS — Z170 Estrogen receptor positive status [ER+]: Secondary | ICD-10-CM | POA: Diagnosis not present

## 2019-11-28 ENCOUNTER — Other Ambulatory Visit: Payer: BC Managed Care – PPO

## 2019-11-28 ENCOUNTER — Other Ambulatory Visit: Payer: Self-pay

## 2019-11-28 DIAGNOSIS — Z789 Other specified health status: Secondary | ICD-10-CM

## 2019-11-28 LAB — SARS-COV-2 IGG: SARS-COV-2 IgG: 0.03

## 2019-12-03 ENCOUNTER — Encounter: Payer: Self-pay | Admitting: *Deleted

## 2019-12-03 DIAGNOSIS — O26891 Other specified pregnancy related conditions, first trimester: Secondary | ICD-10-CM | POA: Diagnosis not present

## 2019-12-03 DIAGNOSIS — Z3689 Encounter for other specified antenatal screening: Secondary | ICD-10-CM | POA: Diagnosis not present

## 2019-12-03 DIAGNOSIS — Z3A10 10 weeks gestation of pregnancy: Secondary | ICD-10-CM | POA: Diagnosis not present

## 2019-12-03 DIAGNOSIS — Z368A Encounter for antenatal screening for other genetic defects: Secondary | ICD-10-CM | POA: Diagnosis not present

## 2019-12-03 DIAGNOSIS — Z113 Encounter for screening for infections with a predominantly sexual mode of transmission: Secondary | ICD-10-CM | POA: Diagnosis not present

## 2019-12-03 DIAGNOSIS — O09521 Supervision of elderly multigravida, first trimester: Secondary | ICD-10-CM | POA: Diagnosis not present

## 2019-12-03 LAB — OB RESULTS CONSOLE RPR: RPR: NONREACTIVE

## 2019-12-03 LAB — OB RESULTS CONSOLE HIV ANTIBODY (ROUTINE TESTING): HIV: NONREACTIVE

## 2019-12-03 LAB — OB RESULTS CONSOLE HEPATITIS B SURFACE ANTIGEN: Hepatitis B Surface Ag: NEGATIVE

## 2019-12-03 LAB — OB RESULTS CONSOLE ABO/RH: RH Type: POSITIVE

## 2019-12-03 LAB — OB RESULTS CONSOLE GC/CHLAMYDIA
Chlamydia: NEGATIVE
Gonorrhea: NEGATIVE

## 2019-12-03 LAB — OB RESULTS CONSOLE RUBELLA ANTIBODY, IGM: Rubella: IMMUNE

## 2019-12-03 LAB — OB RESULTS CONSOLE ANTIBODY SCREEN: Antibody Screen: NEGATIVE

## 2019-12-05 ENCOUNTER — Other Ambulatory Visit: Payer: BC Managed Care – PPO

## 2019-12-06 ENCOUNTER — Encounter: Payer: Self-pay | Admitting: Hematology and Oncology

## 2019-12-14 NOTE — L&D Delivery Note (Addendum)
Delivery Note Pt comfortable after epidural redosed. Quickly labored to complete. Pt pushed three times and at 6:49 PM a viable female was delivered via  (Presentation: Right Occiput Anterior).  APGAR:8,9 ; weight pending.   Nuchal x 1, loose easily reduced over infants head. Body followed next. Vigorous cry.  Cord clamped and cut after a minute delay.  Per pt and husband request cord blood obtained for banking - drawn into syringe with heparin per instructions.   Placenta status: delivered spontaneously, intact, schultz ,  .  Cord: 3 vessels with the following complications: None.  Cord pH: n/a  Segment of cord cut for banking - cleansed with alcohol wipe.   Anesthesia: Epidural Episiotomy: None Lacerations: None Suture Repair: n/a Est. Blood Loss (mL):  110m  Mom to postpartum.  Baby to Couplet care / Skin to Skin  They desire circumcision for baby.  CIsaiah Serge6/25/2021, 7:11 PM

## 2019-12-17 DIAGNOSIS — Z9011 Acquired absence of right breast and nipple: Secondary | ICD-10-CM | POA: Diagnosis not present

## 2019-12-17 DIAGNOSIS — S21101A Unspecified open wound of right front wall of thorax without penetration into thoracic cavity, initial encounter: Secondary | ICD-10-CM | POA: Diagnosis not present

## 2019-12-17 DIAGNOSIS — O9A111 Malignant neoplasm complicating pregnancy, first trimester: Secondary | ICD-10-CM | POA: Diagnosis not present

## 2019-12-17 DIAGNOSIS — Z79899 Other long term (current) drug therapy: Secondary | ICD-10-CM | POA: Diagnosis not present

## 2019-12-17 DIAGNOSIS — N6031 Fibrosclerosis of right breast: Secondary | ICD-10-CM | POA: Diagnosis not present

## 2019-12-17 DIAGNOSIS — D0511 Intraductal carcinoma in situ of right breast: Secondary | ICD-10-CM | POA: Diagnosis not present

## 2019-12-17 DIAGNOSIS — Z8249 Family history of ischemic heart disease and other diseases of the circulatory system: Secondary | ICD-10-CM | POA: Diagnosis not present

## 2019-12-17 DIAGNOSIS — Z3A12 12 weeks gestation of pregnancy: Secondary | ICD-10-CM | POA: Diagnosis not present

## 2019-12-17 DIAGNOSIS — D0501 Lobular carcinoma in situ of right breast: Secondary | ICD-10-CM | POA: Diagnosis not present

## 2019-12-17 DIAGNOSIS — Z170 Estrogen receptor positive status [ER+]: Secondary | ICD-10-CM | POA: Diagnosis not present

## 2019-12-17 DIAGNOSIS — Z01818 Encounter for other preprocedural examination: Secondary | ICD-10-CM | POA: Diagnosis not present

## 2019-12-17 DIAGNOSIS — C50311 Malignant neoplasm of lower-inner quadrant of right female breast: Secondary | ICD-10-CM | POA: Diagnosis not present

## 2019-12-18 DIAGNOSIS — O9A111 Malignant neoplasm complicating pregnancy, first trimester: Secondary | ICD-10-CM | POA: Diagnosis not present

## 2019-12-18 DIAGNOSIS — Z3A12 12 weeks gestation of pregnancy: Secondary | ICD-10-CM | POA: Diagnosis not present

## 2019-12-18 DIAGNOSIS — C50311 Malignant neoplasm of lower-inner quadrant of right female breast: Secondary | ICD-10-CM | POA: Diagnosis not present

## 2019-12-18 DIAGNOSIS — Z79899 Other long term (current) drug therapy: Secondary | ICD-10-CM | POA: Diagnosis not present

## 2019-12-18 DIAGNOSIS — Z8249 Family history of ischemic heart disease and other diseases of the circulatory system: Secondary | ICD-10-CM | POA: Diagnosis not present

## 2019-12-18 DIAGNOSIS — Z170 Estrogen receptor positive status [ER+]: Secondary | ICD-10-CM | POA: Diagnosis not present

## 2019-12-24 DIAGNOSIS — C50919 Malignant neoplasm of unspecified site of unspecified female breast: Secondary | ICD-10-CM | POA: Diagnosis not present

## 2019-12-24 DIAGNOSIS — D0501 Lobular carcinoma in situ of right breast: Secondary | ICD-10-CM | POA: Diagnosis not present

## 2019-12-24 DIAGNOSIS — C50911 Malignant neoplasm of unspecified site of right female breast: Secondary | ICD-10-CM | POA: Diagnosis not present

## 2019-12-24 DIAGNOSIS — Z170 Estrogen receptor positive status [ER+]: Secondary | ICD-10-CM | POA: Diagnosis not present

## 2019-12-26 DIAGNOSIS — C50811 Malignant neoplasm of overlapping sites of right female breast: Secondary | ICD-10-CM | POA: Diagnosis not present

## 2019-12-26 DIAGNOSIS — Z170 Estrogen receptor positive status [ER+]: Secondary | ICD-10-CM | POA: Diagnosis not present

## 2019-12-28 DIAGNOSIS — Z170 Estrogen receptor positive status [ER+]: Secondary | ICD-10-CM | POA: Diagnosis not present

## 2019-12-28 DIAGNOSIS — C50919 Malignant neoplasm of unspecified site of unspecified female breast: Secondary | ICD-10-CM | POA: Diagnosis not present

## 2020-01-02 DIAGNOSIS — C50919 Malignant neoplasm of unspecified site of unspecified female breast: Secondary | ICD-10-CM | POA: Diagnosis not present

## 2020-01-02 DIAGNOSIS — Z170 Estrogen receptor positive status [ER+]: Secondary | ICD-10-CM | POA: Diagnosis not present

## 2020-01-07 DIAGNOSIS — Z170 Estrogen receptor positive status [ER+]: Secondary | ICD-10-CM | POA: Diagnosis not present

## 2020-01-07 DIAGNOSIS — C50919 Malignant neoplasm of unspecified site of unspecified female breast: Secondary | ICD-10-CM | POA: Diagnosis not present

## 2020-01-14 ENCOUNTER — Ambulatory Visit: Payer: BC Managed Care – PPO | Attending: General Surgery | Admitting: Physical Therapy

## 2020-01-14 ENCOUNTER — Encounter: Payer: Self-pay | Admitting: Physical Therapy

## 2020-01-14 ENCOUNTER — Other Ambulatory Visit: Payer: Self-pay

## 2020-01-14 DIAGNOSIS — R293 Abnormal posture: Secondary | ICD-10-CM

## 2020-01-14 DIAGNOSIS — C50311 Malignant neoplasm of lower-inner quadrant of right female breast: Secondary | ICD-10-CM | POA: Diagnosis not present

## 2020-01-14 DIAGNOSIS — Z483 Aftercare following surgery for neoplasm: Secondary | ICD-10-CM | POA: Diagnosis not present

## 2020-01-14 DIAGNOSIS — Z170 Estrogen receptor positive status [ER+]: Secondary | ICD-10-CM | POA: Insufficient documentation

## 2020-01-14 DIAGNOSIS — M79601 Pain in right arm: Secondary | ICD-10-CM | POA: Diagnosis not present

## 2020-01-14 DIAGNOSIS — M25611 Stiffness of right shoulder, not elsewhere classified: Secondary | ICD-10-CM | POA: Diagnosis not present

## 2020-01-14 NOTE — Therapy (Signed)
[place], [place], [address] Phone: [phone]   Fax:  [phone]  Physical Therapy Evaluation  Patient Details  Name: Tabitha Brown MRN: [id number] Date of Birth: September 26, 1981 Referring Provider (PT): Dr. Rolm Bookbinder   Encounter Date: 01/14/2020  PT End of Session - 01/14/20 1539    Visit Number  2    Number of Visits  10    Date for PT Re-Evaluation  02/11/20    PT Start Time  1302    PT Stop Time  1405    PT Time Calculation (min)  63 min    Activity Tolerance  Patient tolerated treatment well    Behavior During Therapy  [facility] for tasks assessed/performed       Past Medical History:  Diagnosis Date  . Breast mass 07/26/2017   Right breast lump x 1 month   . Eating disorder   . Family history of cholangiocarcinoma   . PONV (postoperative nausea and vomiting)   . right breast ca    diagnosed 09/18/19  . UTI (urinary tract infection)     Past Surgical History:  Procedure Laterality Date  . APPENDECTOMY    . AUGMENTATION MAMMAPLASTY Bilateral 2010  . BREAST AUGMENTATION  2010  . BREAST LUMPECTOMY WITH RADIOACTIVE SEED AND SENTINEL LYMPH NODE BIOPSY Right 10/18/2019   Procedure: RIGHT BREAST LUMPECTOMY WITH RADIOACTIVE SEED AND RIGHT AXILLARY SENTINEL LYMPH NODE BIOPSY;  Surgeon: Rolm Bookbinder, MD;  Location: [place];  Service: General;  Laterality: Right;  . CESAREAN SECTION     pt denies this-states she had 2 vaginal births  . RE-EXCISION OF BREAST CANCER,SUPERIOR MARGINS Right 11/01/2019   Procedure: RE-EXCISION OF RIGHT BREAST CANCER MARGINS;  Surgeon: Rolm Bookbinder, MD;  Location: [place];  Service: General;  Laterality: Right;    There were no vitals filed for this visit.   Subjective Assessment - 01/14/20 1305    Subjective  Patient underwent a right lumpectomy and sentinel node biopsy on 10/18/2019 when 3 negative nodes were removed. She then found  out she was pregnant. Margins were not clear and pathology was incorrect by [facility] so pt switched her care to [facility] for further surgery. She had a right mastectomy at [facility] on 12/17/2019 and plans to have reconstruction after she delivers her baby. She is due 06/27/2020 and reports she is currently [redacted] weeks pregnant. She does not need radiation. She reports she is having some limitations with her right arm.    Pertinent History  Patient was diagnosed on 09/05/2019 with right grade III invasive ductal carcinoma breast cancer. Patient underwent a right lumpectomy and sentinel node biopsy on 10/18/2019 when 3 negative nodes were removed followed by a right mastectomy on 12/17/2019. It is ER/PR positive and HER2 negative with a Ki67 of 10%. She has previous breast implants from 2010. She had a right mastectomy at [facility] on 12/17/2019 and plans ot have reconstruction after she delivers her baby. She is due 06/27/2020.    Patient Stated Goals  Get arm moving better    Currently in Pain?  Yes    Pain Score  2     Pain Location  Axilla    Pain Orientation  Right    Pain Descriptors / Indicators  Aching;Tightness    Pain Type  Surgical pain    Pain Onset  More than a month ago    Pain Frequency  Intermittent    Aggravating Factors   Reaching  overhead    Pain Relieving Factors  Rest    Multiple Pain Sites  No         [facility] PT Assessment - 01/14/20 0001      Assessment   Medical Diagnosis  s/p right lump SLN then mast    Referring Provider (PT)  Dr. Rolm Bookbinder    Onset Date/Surgical Date  10/17/20    Hand Dominance  Right    Prior Therapy  Baselines      Precautions   Precautions  Other (comment)    Precaution Comments  recent surgery      Restrictions   Weight Bearing Restrictions  No      Balance Screen   Has the patient fallen in the past 6 months  No    Has the patient had a decrease in activity level because of a fear of falling?   No    Is the patient reluctant to leave their home  because of a fear of falling?   No      Home Social worker  Private residence    Living Arrangements  Spouse/significant other;Children   Husband, 3 and 50 y.o. kids   Available Help at Discharge  Family      Prior Function   Level of Independence  Independent    Vocation  Full time employment    Archivist    Leisure  She is doing some yoga and is walking 4 miles/day      Cognition   Overall Cognitive Status  Within Functional Limits for tasks assessed      Observation/Other Assessments   Observations  Mastectomy incision is healing well. Implant remained in place from previous breast augmentation. Mild axillary coding present. Tissue restriction present right lateral trunk with c/o tightness.      Posture/Postural Control   Posture/Postural Control  Postural limitations    Postural Limitations  Rounded Shoulders      ROM / Strength   AROM / PROM / Strength  AROM      AROM   AROM Assessment Site  Shoulder    Right/Left Shoulder  Right    Right Shoulder Extension  43 Degrees    Right Shoulder Flexion  144 Degrees    Right Shoulder ABduction  145 Degrees    Right Shoulder Internal Rotation  52 Degrees    Right Shoulder External Rotation  82 Degrees      Special Tests   Other special tests  Positive right upper limb tension test        LYMPHEDEMA/ONCOLOGY QUESTIONNAIRE - 01/14/20 1319      Type   Cancer Type  Right breast cancer      Surgeries   Mastectomy Date  12/17/19    Lumpectomy Date  10/18/19    Sentinel Lymph Node Biopsy Date  10/18/19    Number Lymph Nodes Removed  3      Treatment   Active Chemotherapy Treatment  No    Past Chemotherapy Treatment  No    Active Radiation Treatment  No    Past Radiation Treatment  No    Current Hormone Treatment  No    Past Hormone Therapy  No      What other symptoms do you have   Are you Having Heaviness or Tightness  Yes    Are you having Pain  Yes    Are you having pitting  edema  No    Is it  Hard or Difficult finding clothes that fit  No    Do you have infections  No    Is there Decreased scar mobility  Yes    Stemmer Sign  No      Lymphedema Assessments   Lymphedema Assessments  Upper extremities      Right Upper Extremity Lymphedema   10 cm Proximal to Olecranon Process  21.5 cm    Olecranon Process  21.1 cm    10 cm Proximal to Ulnar Styloid Process  19 cm    Just Proximal to Ulnar Styloid Process  13.7 cm    Across Hand at [employer]  18.6 cm    At [place] of 2nd Digit  5.7 cm      Left Upper Extremity Lymphedema   10 cm Proximal to Olecranon Process  21.5 cm    Olecranon Process  20.2 cm    10 cm Proximal to Ulnar Styloid Process  19.1 cm    Just Proximal to Ulnar Styloid Process  13.8 cm    Across Hand at [employer]  18.5 cm    At [place] of 2nd Digit  5.4 cm          Quick Dash - 01/14/20 0001    Open a tight or new jar  Mild difficulty    Do heavy household chores (wash walls, wash floors)  Mild difficulty    Carry a shopping bag or briefcase  Mild difficulty    Wash your back  No difficulty    Use a knife to cut food  Mild difficulty    Recreational activities in which you take some force or impact through your arm, shoulder, or hand (golf, hammering, tennis)  Moderate difficulty    During the past week, to what extent has your arm, shoulder or hand problem interfered with your normal social activities with family, friends, neighbors, or groups?  Slightly    During the past week, to what extent has your arm, shoulder or hand problem limited your work or other regular daily activities  Slightly    Arm, shoulder, or hand pain.  Mild    Tingling (pins and needles) in your arm, shoulder, or hand  Mild    Difficulty Sleeping  Mild difficulty    DASH Score  25 %        Objective measurements completed on examination: See above findings.              PT Education - 01/14/20 1538    Education Details  Closed chain  abduction and closed chain flexion; lymphedema education    Person(s) Educated  Patient    Methods  Explanation;Demonstration;Handout    Comprehension  Returned demonstration;Verbalized understanding          PT Long Term Goals - 01/14/20 1547      PT LONG TERM GOAL #1   Title  Patient will demonstrate she has regained full shoulder ROM and function post operativley compared ot baselines.    Time  4    Period  Weeks    Status  On-going    Target Date  02/11/20      PT LONG TERM GOAL #2   Title  Patient will improve right shoulder ROM active abduction to be >/= 160 for return to baseline and for increased ease reaching.    Baseline  145 post op    Time  4    Period  Weeks    Status  New  Target Date  02/11/20      PT LONG TERM GOAL #3   Title  Patient will improve DASH score to </= 5 for improved overall upper extremity function.    Baseline  25 post op    Time  4    Period  Weeks    Status  New    Target Date  02/11/20      PT LONG TERM GOAL #4   Title  Patient will report >/= 25% less pain in her right arm from neural tension to tolerate daily tasks with greater ease.    Time  4    Period  Weeks    Status  New    Target Date  02/11/20      PT LONG TERM GOAL #5   Title  Patient will report she has returned to lifting weights with BUE for improved overall strength.    Time  4    Period  Weeks    Status  New    Target Date  02/11/20             Plan - 01/14/20 1540    Clinical Impression Statement  Patient is recovering from her most recent surgery, a right mastectomy done at [facility]. She originally had a right lumpectomy and sentinel node biopsy on 10/18/2019. She reports her pathology report was incorrect and "messed up" by [facility] diagnosing her with DCIS. She had her pathology then read by [facility] and was found to have invasive cancer and also found she did not have clear margins and needed a mastectomy. When she went in for her pre-op just before  surgery, she found out she was pregnant. She then transferred her care to [facility] and underwent a right mastectomy with Propofol on 12/17/2019. She had breast augmentation surgery previously and the surgeon was able to leave her implants in place. She plans to have further reconstruction after her baby is born. She has limited shoulder ROM, fascial restrictions, and some pain and weakness as would be expected after her surgeries. She will benefit from PT to address all of those.    PT Treatment/Interventions  ADLs/Self Care Home Management;Therapeutic exercise;Patient/family education;Manual techniques;Manual lymph drainage;Therapeutic activities;Passive range of motion;Scar mobilization    PT Next Visit Plan  Begin PROM right shoulder and myofascial release in right axilla and lateral trunk; right UE neural stretching    PT Home Exercise Plan  Post op shoulder ROM HEP    Consulted and Agree with Plan of Care  Patient       Patient will benefit from skilled therapeutic intervention in order to improve the following deficits and impairments:  Decreased knowledge of precautions, Postural dysfunction, Decreased range of motion, Impaired UE functional use, Pain, Increased fascial restricitons, Decreased scar mobility  Visit Diagnosis: Malignant neoplasm of lower-inner quadrant of right breast of female, estrogen receptor positive ([place]) - Plan: PT plan of care cert/re-cert  Abnormal posture - Plan: PT plan of care cert/re-cert  Aftercare following surgery for neoplasm - Plan: PT plan of care cert/re-cert  Stiffness of right shoulder, not elsewhere classified - Plan: PT plan of care cert/re-cert  Pain in right arm - Plan: PT plan of care cert/re-cert     Problem List Patient Active Problem List   Diagnosis Date Noted  . Genetic testing 10/03/2019  . Family history of cholangiocarcinoma   . Malignant neoplasm of lower-inner quadrant of right breast of female, estrogen receptor positive ([place])  09/19/2019   Inez Catalina  Junious Dresser, PT 01/14/20 3:52 PM  [place], [place], [address] Phone: [phone]   Fax:  [phone]  Name: Tabitha Brown MRN: [id number] Date of Birth: [birth year]

## 2020-01-14 NOTE — Patient Instructions (Signed)
Closed Chain: Shoulder Flexion / Extension - on Wall    Hands on wall, step backward. Return. Stepping causes shoulder flexion and extension Do _5__ times, holding for 5-10 seconds, _3__ times per day.  http://ss.exer.us/265   Copyright  VHI. All rights reserved.  Closed Chain: Shoulder Abduction / Adduction - on Wall    One hand on wall, step to side and return. Stepping causes shoulder to abduct and adduct. Step _5__ times, holding 5-10 seconds, _3__ times per day.  http://ss.exer.us/267   Copyright  VHI. All rights reserved.

## 2020-01-30 DIAGNOSIS — Z363 Encounter for antenatal screening for malformations: Secondary | ICD-10-CM | POA: Diagnosis not present

## 2020-01-30 DIAGNOSIS — O09512 Supervision of elderly primigravida, second trimester: Secondary | ICD-10-CM | POA: Diagnosis not present

## 2020-01-30 DIAGNOSIS — Z3A18 18 weeks gestation of pregnancy: Secondary | ICD-10-CM | POA: Diagnosis not present

## 2020-02-15 ENCOUNTER — Other Ambulatory Visit: Payer: Self-pay

## 2020-02-15 ENCOUNTER — Ambulatory Visit: Payer: BC Managed Care – PPO | Attending: General Surgery | Admitting: Rehabilitation

## 2020-02-15 ENCOUNTER — Encounter: Payer: Self-pay | Admitting: Rehabilitation

## 2020-02-15 DIAGNOSIS — Z170 Estrogen receptor positive status [ER+]: Secondary | ICD-10-CM | POA: Insufficient documentation

## 2020-02-15 DIAGNOSIS — M79601 Pain in right arm: Secondary | ICD-10-CM | POA: Diagnosis not present

## 2020-02-15 DIAGNOSIS — R293 Abnormal posture: Secondary | ICD-10-CM | POA: Insufficient documentation

## 2020-02-15 DIAGNOSIS — C50311 Malignant neoplasm of lower-inner quadrant of right female breast: Secondary | ICD-10-CM | POA: Diagnosis not present

## 2020-02-15 DIAGNOSIS — M25611 Stiffness of right shoulder, not elsewhere classified: Secondary | ICD-10-CM | POA: Diagnosis not present

## 2020-02-15 DIAGNOSIS — Z483 Aftercare following surgery for neoplasm: Secondary | ICD-10-CM | POA: Diagnosis not present

## 2020-02-18 NOTE — Therapy (Signed)
[place], [place], [address] Phone: [phone]   Fax:  [phone]  Physical Therapy Treatment  Patient Details  Name: Tabitha Brown MRN: [id number] Date of Birth: 12/20/1980 Referring Provider (PT): Dr. Rolm Bookbinder   Encounter Date: 02/15/2020    Past Medical History:  Diagnosis Date  . Breast mass 07/26/2017   Right breast lump x 1 month   . Eating disorder   . Family history of cholangiocarcinoma   . PONV (postoperative nausea and vomiting)   . right breast ca    diagnosed 09/18/19  . UTI (urinary tract infection)     Past Surgical History:  Procedure Laterality Date  . APPENDECTOMY    . AUGMENTATION MAMMAPLASTY Bilateral 2010  . BREAST AUGMENTATION  2010  . BREAST LUMPECTOMY WITH RADIOACTIVE SEED AND SENTINEL LYMPH NODE BIOPSY Right 10/18/2019   Procedure: RIGHT BREAST LUMPECTOMY WITH RADIOACTIVE SEED AND RIGHT AXILLARY SENTINEL LYMPH NODE BIOPSY;  Surgeon: Rolm Bookbinder, MD;  Location: [place];  Service: General;  Laterality: Right;  . CESAREAN SECTION     pt denies this-states she had 2 vaginal births  . RE-EXCISION OF BREAST CANCER,SUPERIOR MARGINS Right 11/01/2019   Procedure: RE-EXCISION OF RIGHT BREAST CANCER MARGINS;  Surgeon: Rolm Bookbinder, MD;  Location: [place];  Service: General;  Laterality: Right;    There were no vitals filed for this visit.                    [facility] Adult PT Treatment/Exercise - 02/18/20 0001      Exercises   Exercises  Other Exercises    Other Exercises   updated HEP to include standing ulnar nerve flossing for neuropathic pain in the arm and hand, doorway stretch, supine dowel, and hands behind head stretch in supine, also discussed yoga poses like triangle, warrior with rotation, child's pose that will assist with pectoralis stretching (from 3/5)       Manual Therapy   Manual Therapy  Passive ROM;Soft  tissue mobilization;Myofascial release;Manual Lymphatic Drainage (MLD)    Manual therapy comments  --    Soft tissue mobilization  (from 3/5) to the Right axilla into the upper arm and pectoralis, latissimus with stretch assist    Myofascial Release  (from 3/5) to the right axilla    Manual Lymphatic Drainage (MLD)  (from 3/5) stationary circle work in the right axilla for edema here    Passive ROM  (from 3/5) supine HOB elevated due to pregnancy Rt shoulder PROM and stretching of the Rt shoulder to include some pectoralis stretching into horizontal abduction                  PT Long Term Goals - 02/15/20 1203      PT LONG TERM GOAL #1   Title  Patient will demonstrate she has regained full shoulder ROM and function post operativley compared ot baselines.    Status  On-going      PT LONG TERM GOAL #2   Title  Patient will improve right shoulder ROM active abduction to be >/= 160 for return to baseline and for increased ease reaching.    Status  On-going      PT LONG TERM GOAL #3   Title  Patient will improve DASH score to </= 5 for improved overall upper extremity function.    Status  On-going      PT LONG TERM GOAL #4   Title  Patient  will report >/= 25% less pain in her right arm from neural tension to tolerate daily tasks with greater ease.    Status  On-going      PT LONG TERM GOAL #5   Title  Patient will report she has returned to lifting weights with BUE for improved overall strength.    Status  On-going              Patient will benefit from skilled therapeutic intervention in order to improve the following deficits and impairments:  Decreased knowledge of precautions, Postural dysfunction, Decreased range of motion, Impaired UE functional use, Pain, Increased fascial restricitons, Decreased scar mobility  Visit Diagnosis: Malignant neoplasm of lower-inner quadrant of right breast of female, estrogen receptor positive ([facility])  Abnormal  posture  Aftercare following surgery for neoplasm  Stiffness of right shoulder, not elsewhere classified  Pain in right arm     Problem List Patient Active Problem List   Diagnosis Date Noted  . Genetic testing 10/03/2019  . Family history of cholangiocarcinoma   . Malignant neoplasm of lower-inner quadrant of right breast of female, estrogen receptor positive ([place]) 09/19/2019    Tabitha Brown 02/18/2020, 8:30 AM  [place], [place], [address] Phone: [phone]   Fax:  [phone]  Name: Tabitha Brown MRN: [id number] Date of Birth: 09-28-1981

## 2020-02-20 DIAGNOSIS — O9229 Other disorders of breast associated with pregnancy and the puerperium: Secondary | ICD-10-CM | POA: Diagnosis not present

## 2020-02-20 DIAGNOSIS — C50919 Malignant neoplasm of unspecified site of unspecified female breast: Secondary | ICD-10-CM | POA: Diagnosis not present

## 2020-02-20 DIAGNOSIS — N644 Mastodynia: Secondary | ICD-10-CM | POA: Diagnosis not present

## 2020-02-20 DIAGNOSIS — Z170 Estrogen receptor positive status [ER+]: Secondary | ICD-10-CM | POA: Diagnosis not present

## 2020-02-22 ENCOUNTER — Ambulatory Visit: Payer: BC Managed Care – PPO | Admitting: Rehabilitation

## 2020-02-22 ENCOUNTER — Encounter: Payer: Self-pay | Admitting: Rehabilitation

## 2020-02-22 ENCOUNTER — Other Ambulatory Visit: Payer: Self-pay

## 2020-02-22 DIAGNOSIS — M79601 Pain in right arm: Secondary | ICD-10-CM

## 2020-02-22 DIAGNOSIS — Z170 Estrogen receptor positive status [ER+]: Secondary | ICD-10-CM | POA: Diagnosis not present

## 2020-02-22 DIAGNOSIS — Z483 Aftercare following surgery for neoplasm: Secondary | ICD-10-CM | POA: Diagnosis not present

## 2020-02-22 DIAGNOSIS — C50311 Malignant neoplasm of lower-inner quadrant of right female breast: Secondary | ICD-10-CM | POA: Diagnosis not present

## 2020-02-22 DIAGNOSIS — R293 Abnormal posture: Secondary | ICD-10-CM

## 2020-02-22 DIAGNOSIS — M25611 Stiffness of right shoulder, not elsewhere classified: Secondary | ICD-10-CM | POA: Diagnosis not present

## 2020-02-22 NOTE — Therapy (Signed)
[place], [place], [address] Phone: [phone]   Fax:  [phone]  Physical Therapy Treatment  Patient Details  Name: Tabitha Brown MRN: [id number] Date of Birth: Oct 09, 1981 Referring Provider (PT): Dr. Rolm Bookbinder   Encounter Date: 02/22/2020  PT End of Session - 02/22/20 0954    Visit Number  4    Number of Visits  15    Date for PT Re-Evaluation  03/28/20    PT Start Time  0800    PT Stop Time  0846    PT Time Calculation (min)  46 min    Activity Tolerance  Patient tolerated treatment well    Behavior During Therapy  [facility] for tasks assessed/performed       Past Medical History:  Diagnosis Date  . Breast mass 07/26/2017   Right breast lump x 1 month   . Eating disorder   . Family history of cholangiocarcinoma   . PONV (postoperative nausea and vomiting)   . right breast ca    diagnosed 09/18/19  . UTI (urinary tract infection)     Past Surgical History:  Procedure Laterality Date  . APPENDECTOMY    . AUGMENTATION MAMMAPLASTY Bilateral 2010  . BREAST AUGMENTATION  2010  . BREAST LUMPECTOMY WITH RADIOACTIVE SEED AND SENTINEL LYMPH NODE BIOPSY Right 10/18/2019   Procedure: RIGHT BREAST LUMPECTOMY WITH RADIOACTIVE SEED AND RIGHT AXILLARY SENTINEL LYMPH NODE BIOPSY;  Surgeon: Rolm Bookbinder, MD;  Location: [place];  Service: General;  Laterality: Right;  . CESAREAN SECTION     pt denies this-states she had 2 vaginal births  . RE-EXCISION OF BREAST CANCER,SUPERIOR MARGINS Right 11/01/2019   Procedure: RE-EXCISION OF RIGHT BREAST CANCER MARGINS;  Surgeon: Rolm Bookbinder, MD;  Location: [place];  Service: General;  Laterality: Right;    There were no vitals filed for this visit.  Subjective Assessment - 02/22/20 0906    Subjective  I had an [place] on the left side due to the pain and it was fine.  I will have an MRI after the baby is born    Pertinent History   Patient was diagnosed on 09/05/2019 with right grade III invasive ductal carcinoma breast cancer. Patient underwent a right lumpectomy and sentinel node biopsy on 10/18/2019 when 3 negative nodes were removed followed by a right mastectomy on 12/17/2019. It is ER/PR positive and HER2 negative with a Ki67 of 10%. She has previous breast implants from 2010. She had a right mastectomy at [facility] on 12/17/2019 and plans ot have reconstruction after she delivers her baby. She is due 06/27/2020.    Currently in Pain?  No/denies                       [facility] Adult PT Treatment/Exercise - 02/22/20 0001      Exercises   Exercises  Shoulder      Shoulder Exercises: Seated   Extension  10 reps;Theraband    Theraband Level (Shoulder Extension)  Level 1 (Yellow)    Row  Both;10 reps    Theraband Level (Shoulder Row)  Level 1 (Yellow)    Row Limitations  with band around feet    External Rotation  Both;10 reps    Theraband Level (Shoulder External Rotation)  Level 1 (Yellow)    External Rotation Limitations  some numbness occuring     Abduction  Both;10 reps    ABduction Weight (lbs)  1  ABduction Limitations  easy pt doing 3# at home      Shoulder Exercises: Pulleys   Flexion  2 minutes    Flexion Limitations  with instruction on pulleys    ABduction  2 minutes    ABduction Limitations  with instruction on initial performance with some hand numbness occuring       Shoulder Exercises: Therapy Ball   Flexion  10 reps   easy     Shoulder Exercises: ROM/Strengthening   Other ROM/Strengthening Exercises  standing dowel abduction x 5 (easy), foam roll on wall roll up and down with manual PT scapular positioning x 10       Manual Therapy   Manual therapy comments  pt reports that upon entering the treatment room from the gym that she frequently has vasovagal epidsodes when pregant and is starting to feel a little sweaty and lightheaded so MT was brief and performed in sidelying to pt could  leave to get fresh air and remove her mask.  Pt reports this is normal for her and declines BP or pulse ox or needing asny assistance    Soft tissue mobilization  in left sidelying: to the right forearm due to pain here, upper arm into the deltoid, pectoralis, and biceps and with arm overhead to axilla and scar tissue                  PT Long Term Goals - 02/15/20 1203      PT LONG TERM GOAL #1   Title  Patient will demonstrate she has regained full shoulder ROM and function post operativley compared ot baselines.    Status  On-going      PT LONG TERM GOAL #2   Title  Patient will improve right shoulder ROM active abduction to be >/= 160 for return to baseline and for increased ease reaching.    Status  On-going      PT LONG TERM GOAL #3   Title  Patient will improve DASH score to </= 5 for improved overall upper extremity function.    Status  On-going      PT LONG TERM GOAL #4   Title  Patient will report >/= 25% less pain in her right arm from neural tension to tolerate daily tasks with greater ease.    Status  On-going      PT LONG TERM GOAL #5   Title  Patient will report she has returned to lifting weights with BUE for improved overall strength.    Status  On-going            Plan - 02/22/20 0954    Clinical Impression Statement  Pt doing better today with most AAROM easy.  Also did well with band exercises and scapular exercises but reports ulnar finger region numbness when the arm goes behind or overhead. MT was brief today and only in sidelying due to pt starting to not feel well but per objective note reports this is normal for her and she just needs to rest a minute.  Feeling fine upon leaving but reports she just needs fresh air.  Pt will call for at least one more appointment for scapular exercises to continue at home if feeling like not more MT is necessary and due to not feeling well with pregnancy.    PT Frequency  2x / week    PT Duration  6 weeks     PT Treatment/Interventions  ADLs/Self Care Home Management;Therapeutic exercise;Patient/family education;Manual techniques;Manual  lymph drainage;Therapeutic activities;Passive range of motion;Scar mobilization;Taping    PT Next Visit Plan  if last appt: final HEP with scapular band exercises seated or supine; continue Right shoulder PROM/myofasical release, right neural stretching and flossing,    PT Home Exercise Plan  Post op shoulder ROM HEP    Consulted and Agree with Plan of Care  Patient       Patient will benefit from skilled therapeutic intervention in order to improve the following deficits and impairments:  Decreased knowledge of precautions, Postural dysfunction, Decreased range of motion, Impaired UE functional use, Pain, Increased fascial restricitons, Decreased scar mobility  Visit Diagnosis: Malignant neoplasm of lower-inner quadrant of right breast of female, estrogen receptor positive ([facility])  Abnormal posture  Aftercare following surgery for neoplasm  Stiffness of right shoulder, not elsewhere classified  Pain in right arm     Problem List Patient Active Problem List   Diagnosis Date Noted  . Genetic testing 10/03/2019  . Family history of cholangiocarcinoma   . Malignant neoplasm of lower-inner quadrant of right breast of female, estrogen receptor positive ([place]) 09/19/2019    Stark Bray 02/22/2020, 10:00 AM  [place], [place], [address] Phone: [phone]   Fax:  [phone]  Name: Tabitha Brown MRN: [id number] Date of Birth: 06-04-1981

## 2020-03-28 DIAGNOSIS — Z23 Encounter for immunization: Secondary | ICD-10-CM | POA: Diagnosis not present

## 2020-03-28 DIAGNOSIS — Z3689 Encounter for other specified antenatal screening: Secondary | ICD-10-CM | POA: Diagnosis not present

## 2020-03-28 DIAGNOSIS — Z1152 Encounter for screening for COVID-19: Secondary | ICD-10-CM | POA: Diagnosis not present

## 2020-04-09 DIAGNOSIS — O09513 Supervision of elderly primigravida, third trimester: Secondary | ICD-10-CM | POA: Diagnosis not present

## 2020-04-09 DIAGNOSIS — Z3A28 28 weeks gestation of pregnancy: Secondary | ICD-10-CM | POA: Diagnosis not present

## 2020-04-09 DIAGNOSIS — O365930 Maternal care for other known or suspected poor fetal growth, third trimester, not applicable or unspecified: Secondary | ICD-10-CM | POA: Diagnosis not present

## 2020-04-28 ENCOUNTER — Ambulatory Visit: Payer: BC Managed Care – PPO | Admitting: Rehabilitation

## 2020-05-05 ENCOUNTER — Ambulatory Visit: Payer: BC Managed Care – PPO | Attending: General Surgery | Admitting: Physical Therapy

## 2020-05-05 ENCOUNTER — Other Ambulatory Visit: Payer: Self-pay

## 2020-05-05 DIAGNOSIS — M79601 Pain in right arm: Secondary | ICD-10-CM | POA: Diagnosis not present

## 2020-05-05 DIAGNOSIS — R293 Abnormal posture: Secondary | ICD-10-CM | POA: Diagnosis not present

## 2020-05-05 DIAGNOSIS — Z483 Aftercare following surgery for neoplasm: Secondary | ICD-10-CM | POA: Diagnosis not present

## 2020-05-05 DIAGNOSIS — M25611 Stiffness of right shoulder, not elsewhere classified: Secondary | ICD-10-CM | POA: Diagnosis not present

## 2020-05-05 NOTE — Patient Instructions (Signed)
Access Code: [email or web address] URL: [email or web address] Date: 05/05/2020 Prepared by: Maudry Diego  Exercises Standing Ulnar Nerve Glide - 1 x daily - 7 x weekly - 10 reps - 3 sets Ulnar Nerve Flossing - 1 x daily - 7 x weekly - 10 reps - 3 sets Ulnar Nerve Flossing - 1 x daily - 7 x weekly - 10 reps - 3 sets Ulnar Nerve Flossing - 1 x daily - 7 x weekly - 10 reps - 3 sets

## 2020-05-05 NOTE — Therapy (Signed)
[place], [place], [address] Phone: [phone]   Fax:  [phone]  Physical Therapy Treatment  Patient Details  Name: Tabitha Brown MRN: [id number] Date of Birth: July 21, 1981 Provider:  Clarisse Gouge , NP  Encounter Date: 05/05/2020  PT End of Session - 05/05/20 1305    Visit Number  5    Number of Visits  15    Date for PT Re-Evaluation  06/06/20    PT Start Time  1103    PT Stop Time  1150    PT Time Calculation (min)  47 min    Equipment Utilized During Treatment  Gait belt    Activity Tolerance  Patient tolerated treatment well    Behavior During Therapy  [facility] for tasks assessed/performed       Past Medical History:  Diagnosis Date  . Breast mass 07/26/2017   Right breast lump x 1 month   . Eating disorder   . Family history of cholangiocarcinoma   . PONV (postoperative nausea and vomiting)   . right breast ca    diagnosed 09/18/19  . UTI (urinary tract infection)     Past Surgical History:  Procedure Laterality Date  . APPENDECTOMY    . AUGMENTATION MAMMAPLASTY Bilateral 2010  . BREAST AUGMENTATION  2010  . BREAST LUMPECTOMY WITH RADIOACTIVE SEED AND SENTINEL LYMPH NODE BIOPSY Right 10/18/2019   Procedure: RIGHT BREAST LUMPECTOMY WITH RADIOACTIVE SEED AND RIGHT AXILLARY SENTINEL LYMPH NODE BIOPSY;  Surgeon: Rolm Bookbinder, MD;  Location: [place];  Service: General;  Laterality: Right;  . CESAREAN SECTION     pt denies this-states she had 2 vaginal births  . RE-EXCISION OF BREAST CANCER,SUPERIOR MARGINS Right 11/01/2019   Procedure: RE-EXCISION OF RIGHT BREAST CANCER MARGINS;  Surgeon: Rolm Bookbinder, MD;  Location: [place];  Service: General;  Laterality: Right;    There were no vitals filed for this visit.  Subjective Assessment - 05/05/20 1109    Subjective  Pt states that she has not come to PT becasue she was having trouble with fainting  due to  pregnancy, but that is is better.  She is till exercising every day including weight lifting (5-8#) and yoga with upper limb weight bearing . she feels pain and weakness in her right arm.  Her baby will be delivered on June 25    Pertinent History  Patient was diagnosed on 09/05/2019 with right grade III invasive ductal carcinoma breast cancer. Patient underwent a right lumpectomy and sentinel node biopsy on 10/18/2019 when 3 negative nodes were removed followed by a right mastectomy on 12/17/2019. It is ER/PR positive and HER2 negative with a Ki67 of 10%. She has previous breast implants from 2010. She had a right mastectomy at [facility] on 12/17/2019 and plans ot have reconstruction after she delivers her baby. She is due 06/27/2020.    Patient Stated Goals  she wants to gets stonger so that she can get back to work in the Fall    Currently in Pain?  No/denies         [facility] PT Assessment - 05/05/20 0001      Assessment   Medical Diagnosis  --    Onset Date/Surgical Date  10/17/20    Hand Dominance  Right      Prior Function   Level of Independence  Independent      AROM   Overall AROM Comments  palpation of right radial pulse maintained throughout  all ROM testing of right arm     Right Shoulder Flexion  170 Degrees    Right Shoulder ABduction  180 Degrees      Strength   Overall Strength Comments  pt has good strenght to iometric testing, but reports she has decreased endurance and right arm feels weaker and numb after a few moments of doing household activites      Palpation   Palpation comment  tightness with trigger points in right upper traps and cervical mucles, with tightness down her arm toward lateral side                     [facility] Adult PT Treatment/Exercise - 05/05/20 0001      Exercises   Exercises  Shoulder      Shoulder Exercises: Supine   Protraction  Strengthening;Left;10 reps      Shoulder Exercises: Seated   Other Seated Exercises  ulnar nerve glides and  flossing       Shoulder Exercises: Standing   Other Standing Exercises  with yellow theraband loop at wall, right UE reach up the wall, out to side and down to side , also etraction       Manual Therapy   Manual Therapy  Soft tissue mobilization    Soft tissue mobilization  in sitting with thick massage cream to tight muscles in right upper trap and cervical muscles             PT Education - 05/05/20 1302    Education Details  ulnar nerve glides and flossing    Person(s) Educated  Patient    Methods  Explanation;Handout;Verbal cues    Comprehension  Verbalized understanding;Returned demonstration          PT Long Term Goals - 05/05/20 1313      PT LONG TERM GOAL #1   Title  Patient will demonstrate she has regained full shoulder ROM and function post operativley compared ot baselines.    Status  Achieved      PT LONG TERM GOAL #2   Title  Patient will improve right shoulder ROM active abduction to be >/= 160 for return to baseline and for increased ease reaching.    Status  Achieved      PT LONG TERM GOAL #3   Title  Patient will improve DASH score to </= 5 for improved overall upper extremity function.    Baseline  25 post op    Status  On-going      PT LONG TERM GOAL #4   Title  Patient will report >/= 25% less numbness to tolerate daily tasks with greater ease.    Time  4    Period  Weeks    Status  Revised      PT LONG TERM GOAL #5   Title  Patient will report she has returned to lifting weights with BUE for improved overall strength.    Status  Achieved            Plan - 05/05/20 1306    Clinical Impression Statement  Pt returns to PT with persistent numbness and tightness in her rigth UE, especially in the ulnar side of her hand and 4th and 5th finger. She has been exercising at home and feels that her ROM is good, but she feels weaker in her right arm with activities and is concerned it will limit her when she goes back to work hopefully in the  Fall. Her baby  is due on June 25 so she would like to use this time to get her arm improved.  She has muscle tightness with trigger points in her upper traps and decreased right scapular stablity so would like to focus on that as well as ulnar nerve mobility    Personal Factors and Comorbidities  Comorbidity 2    Comorbidities  Lymphectomy, lymph node removal mastectomy ( 3 surgeries )    Examination-Activity Limitations  Reach Overhead;Caring for Others    Stability/Clinical Decision Making  Stable/Uncomplicated    Clinical Decision Making  Low    PT Treatment/Interventions  ADLs/Self Care Home Management;Therapeutic exercise;Patient/family education;Manual techniques;Manual lymph drainage;Therapeutic activities;Passive range of motion;Scar mobilization;Taping    PT Next Visit Plan  Soft tissue work to upper traps, axillary area and arm  HEP with scapular band exercises seated or supine; continue Right shoulder PROM/myofasical release, right neural stretching and flossing,    PT Home Exercise Plan  Post op shoulder ROM HEP, neural stretching    Consulted and Agree with Plan of Care  Patient       Patient will benefit from skilled therapeutic intervention in order to improve the following deficits and impairments:  Decreased knowledge of precautions, Postural dysfunction, Decreased range of motion, Impaired UE functional use, Pain, Increased fascial restricitons, Decreased scar mobility, Impaired flexibility, Decreased strength, Increased muscle spasms  Visit Diagnosis: Aftercare following surgery for neoplasm  Abnormal posture  Stiffness of right shoulder, not elsewhere classified  Pain in right arm     Problem List Patient Active Problem List   Diagnosis Date Noted  . Genetic testing 10/03/2019  . Family history of cholangiocarcinoma   . Malignant neoplasm of lower-inner quadrant of right breast of female, estrogen receptor positive ([place]) 09/19/2019   Donato Heinz. Owens Shark PT  Norwood Levo 05/05/2020, 1:15 PM  [place], [place], [address] Phone: [phone]   Fax:  [phone]  Name: Cyana Shook MRN: [id number] Date of Birth: January 30, 1981

## 2020-05-08 ENCOUNTER — Ambulatory Visit: Payer: BC Managed Care – PPO | Admitting: Physical Therapy

## 2020-05-08 ENCOUNTER — Other Ambulatory Visit: Payer: Self-pay

## 2020-05-08 DIAGNOSIS — M25611 Stiffness of right shoulder, not elsewhere classified: Secondary | ICD-10-CM

## 2020-05-08 DIAGNOSIS — R293 Abnormal posture: Secondary | ICD-10-CM | POA: Diagnosis not present

## 2020-05-08 DIAGNOSIS — M79601 Pain in right arm: Secondary | ICD-10-CM

## 2020-05-08 DIAGNOSIS — Z483 Aftercare following surgery for neoplasm: Secondary | ICD-10-CM | POA: Diagnosis not present

## 2020-05-08 NOTE — Therapy (Addendum)
[place], [place], [address] Phone: [phone]   Fax:  [phone]  Physical Therapy Treatment  Patient Details  Name: Tabitha Brown MRN: [id number] Date of Birth: [birth year] Referring Provider (PT): Clarisse Gouge, NP    Encounter Date: 05/08/2020  PT End of Session - 05/08/20 1749    Visit Number  6    Number of Visits  15    Date for PT Re-Evaluation  06/06/20    PT Start Time  1400    PT Stop Time  1445    PT Time Calculation (min)  45 min    Activity Tolerance  Patient tolerated treatment well    Behavior During Therapy  [facility] for tasks assessed/performed       Past Medical History:  Diagnosis Date  . Breast mass 07/26/2017   Right breast lump x 1 month   . Eating disorder   . Family history of cholangiocarcinoma   . PONV (postoperative nausea and vomiting)   . right breast ca    diagnosed 09/18/19  . UTI (urinary tract infection)     Past Surgical History:  Procedure Laterality Date  . APPENDECTOMY    . AUGMENTATION MAMMAPLASTY Bilateral 2010  . BREAST AUGMENTATION  2010  . BREAST LUMPECTOMY WITH RADIOACTIVE SEED AND SENTINEL LYMPH NODE BIOPSY Right 10/18/2019   Procedure: RIGHT BREAST LUMPECTOMY WITH RADIOACTIVE SEED AND RIGHT AXILLARY SENTINEL LYMPH NODE BIOPSY;  Surgeon: Rolm Bookbinder, MD;  Location: [facility];  Service: General;  Laterality: Right;  . CESAREAN SECTION     pt denies this-states she had 2 vaginal births  . RE-EXCISION OF BREAST CANCER,SUPERIOR MARGINS Right 11/01/2019   Procedure: RE-EXCISION OF RIGHT BREAST CANCER MARGINS;  Surgeon: Rolm Bookbinder, MD;  Location: [facility];  Service: General;  Laterality: Right;    There were no vitals filed for this visit.  Subjective Assessment - 05/08/20 1410    Subjective  Pt has been doing her exercises and had no problem with those. She does does get numbness with the ulnar glides and it  lingers a lttle bit    Pertinent History  Patient was diagnosed on 09/05/2019 with right grade III invasive ductal carcinoma breast cancer. Patient underwent a right lumpectomy and sentinel node biopsy on 10/18/2019 when 3 negative nodes were removed followed by a right mastectomy on 12/17/2019. It is ER/PR positive and HER2 negative with a Ki67 of 10%. She has previous breast implants from 2010. She had a right mastectomy at [facility] on 12/17/2019 and plans ot have reconstruction after she delivers her baby. She is due 06/27/2020.    Patient Stated Goals  she wants to gets stonger so that she can get back to work in the Fall    Currently in Pain?  No/denies                        [facility] Adult PT Treatment/Exercise - 05/08/20 0001      Modalities   Modalities  Moist Heat      Moist Heat Therapy   Number Minutes Moist Heat  10 Minutes    Moist Heat Location  Cervical   right posterior shoudler      Manual Therapy   Manual Therapy  Soft tissue mobilization    Soft tissue mobilization  in semi left sidlying with  pillow support, massage cream to right upper trap, posterior shoulder, upper arm along ulnar nerve track, elbow  and forearm. with extra time at tight area at subscapularis with prolonged time on pressure point.                   PT Long Term Goals - 05/05/20 1313      PT LONG TERM GOAL #1   Title  Patient will demonstrate she has regained full shoulder ROM and function post operativley compared ot baselines.    Status  Achieved      PT LONG TERM GOAL #2   Title  Patient will improve right shoulder ROM active abduction to be >/= 160 for return to baseline and for increased ease reaching.    Status  Achieved      PT LONG TERM GOAL #3   Title  Patient will improve DASH score to </= 5 for improved overall upper extremity function.    Baseline  25 post op    Status  On-going      PT LONG TERM GOAL #4   Title  Patient will report >/= 25% less numbness to tolerate  daily tasks with greater ease.    Time  4    Period  Weeks    Status  Revised      PT LONG TERM GOAL #5   Title  Patient will report she has returned to lifting weights with BUE for improved overall strength.    Status  Achieved            Plan - 05/08/20 1749    Clinical Impression Statement  Pt reports that she feels that the numbness is less when she provoked it with ulnar stretch position after soft tissue work.  She does have tightness in upper trap and axillary muscles so question if she has had some compression of the nerve from muscle tension in this area??    Personal Factors and Comorbidities  Comorbidity 2    Comorbidities  Lymphectomy, lymph node removal mastectomy ( 3 surgeries )    Examination-Activity Limitations  Reach Overhead;Caring for Others    Rehab Potential  Excellent    PT Frequency  2x / week    PT Duration  6 weeks    PT Treatment/Interventions  ADLs/Self Care Home Management;Therapeutic exercise;Patient/family education;Manual techniques;Manual lymph drainage;Therapeutic activities;Passive range of motion;Scar mobilization;Taping    PT Next Visit Plan  Brief moist heat to relax and prepare muscles, then Soft tissue work to upper traps, axillary area and arm  along ulnar nerve ; continue Right shoulder PROM/myofasical release, right neural stretching and flossing as needed    Consulted and Agree with Plan of Care  Patient       Patient will benefit from skilled therapeutic intervention in order to improve the following deficits and impairments:  Decreased knowledge of precautions, Postural dysfunction, Decreased range of motion, Impaired UE functional use, Pain, Increased fascial restricitons, Decreased scar mobility, Impaired flexibility, Decreased strength, Increased muscle spasms  Visit Diagnosis: Aftercare following surgery for neoplasm  Abnormal posture  Stiffness of right shoulder, not elsewhere classified  Pain in right arm     Problem  List Patient Active Problem List   Diagnosis Date Noted  . Genetic testing 10/03/2019  . Family history of cholangiocarcinoma   . Malignant neoplasm of lower-inner quadrant of right breast of female, estrogen receptor positive ([place]) 09/19/2019   Donato Heinz. Owens Shark PT  Norwood Levo 05/08/2020, 5:53 PM  [place], [place], [address] Phone: [phone]   Fax:  [phone]  Name: Mery Guadalupe MRN: [id number] Date of Birth: [birth year]  PHYSICAL THERAPY DISCHARGE SUMMARY  Visits from Start of Care: 6  Current functional level related to goals / functional outcomes: unknown  Remaining deficits: unknown   Education / Equipment: Home exercise  Plan: Patient agrees to discharge.  Patient goals were partially met. Patient is being discharged due to not returning since the last visit.  ?????    Maudry Diego, PT 06/19/20 1:58 PM

## 2020-05-15 DIAGNOSIS — O365933 Maternal care for other known or suspected poor fetal growth, third trimester, fetus 3: Secondary | ICD-10-CM | POA: Diagnosis not present

## 2020-05-15 DIAGNOSIS — Z3689 Encounter for other specified antenatal screening: Secondary | ICD-10-CM | POA: Diagnosis not present

## 2020-05-15 DIAGNOSIS — O09519 Supervision of elderly primigravida, unspecified trimester: Secondary | ICD-10-CM | POA: Diagnosis not present

## 2020-05-15 DIAGNOSIS — O9229 Other disorders of breast associated with pregnancy and the puerperium: Secondary | ICD-10-CM | POA: Diagnosis not present

## 2020-05-15 DIAGNOSIS — Z3A34 34 weeks gestation of pregnancy: Secondary | ICD-10-CM | POA: Diagnosis not present

## 2020-05-15 LAB — OB RESULTS CONSOLE GBS: GBS: NEGATIVE

## 2020-05-23 ENCOUNTER — Encounter: Payer: BC Managed Care – PPO | Admitting: Physical Therapy

## 2020-06-02 ENCOUNTER — Telehealth (HOSPITAL_COMMUNITY): Payer: Self-pay | Admitting: *Deleted

## 2020-06-02 ENCOUNTER — Encounter (HOSPITAL_COMMUNITY): Payer: Self-pay | Admitting: *Deleted

## 2020-06-02 NOTE — Telephone Encounter (Signed)
Preadmission screen  

## 2020-06-04 ENCOUNTER — Other Ambulatory Visit (HOSPITAL_COMMUNITY): Admission: RE | Admit: 2020-06-04 | Payer: BC Managed Care – PPO | Source: Ambulatory Visit

## 2020-06-04 ENCOUNTER — Other Ambulatory Visit (HOSPITAL_COMMUNITY)
Admission: RE | Admit: 2020-06-04 | Discharge: 2020-06-04 | Disposition: A | Discharge status: Home / Self Care | Payer: BC Managed Care – PPO | Source: Ambulatory Visit | Attending: Obstetrics and Gynecology | Admitting: Obstetrics and Gynecology

## 2020-06-04 ENCOUNTER — Other Ambulatory Visit (HOSPITAL_COMMUNITY): Payer: BC Managed Care – PPO

## 2020-06-04 DIAGNOSIS — Z7982 Long term (current) use of aspirin: Secondary | ICD-10-CM | POA: Diagnosis not present

## 2020-06-04 DIAGNOSIS — O9229 Other disorders of breast associated with pregnancy and the puerperium: Secondary | ICD-10-CM | POA: Diagnosis not present

## 2020-06-04 DIAGNOSIS — C799 Secondary malignant neoplasm of unspecified site: Secondary | ICD-10-CM | POA: Diagnosis not present

## 2020-06-04 DIAGNOSIS — C50911 Malignant neoplasm of unspecified site of right female breast: Secondary | ICD-10-CM | POA: Diagnosis not present

## 2020-06-04 DIAGNOSIS — Z01812 Encounter for preprocedural laboratory examination: Secondary | ICD-10-CM | POA: Insufficient documentation

## 2020-06-04 DIAGNOSIS — O9A12 Malignant neoplasm complicating childbirth: Secondary | ICD-10-CM | POA: Diagnosis not present

## 2020-06-04 DIAGNOSIS — Z3A00 Weeks of gestation of pregnancy not specified: Secondary | ICD-10-CM | POA: Diagnosis not present

## 2020-06-04 DIAGNOSIS — Z3A37 37 weeks gestation of pregnancy: Secondary | ICD-10-CM | POA: Diagnosis not present

## 2020-06-04 DIAGNOSIS — Z20822 Contact with and (suspected) exposure to covid-19: Secondary | ICD-10-CM | POA: Diagnosis not present

## 2020-06-04 DIAGNOSIS — Z170 Estrogen receptor positive status [ER+]: Secondary | ICD-10-CM | POA: Diagnosis not present

## 2020-06-04 LAB — SARS CORONAVIRUS 2 (TAT 6-24 HRS): SARS Coronavirus 2: NEGATIVE

## 2020-06-05 ENCOUNTER — Other Ambulatory Visit: Payer: Self-pay | Admitting: Obstetrics and Gynecology

## 2020-06-05 NOTE — H&P (Deleted)
  The note originally documented on this encounter has been moved the the encounter in which it belongs.  

## 2020-06-05 NOTE — H&P (Signed)
Tabitha Brown is a 39 y.o. female G3P2002 at 57 1/7 weeks (EDD 06/26/20 by 6 week [place] c/w LMP) presenting for induction of labor given active treatment for breast cancer dx this pregnancy. Prenatal care significant for : 1)  Advanced maternal age gravida--          baby asa, folic acid          low risk Panorama 2) Malignant tumor of breast               10/2019 invasive IDC stage 1A-ER/PR +           R lumpectomy followed by right                               mastectomy 1/21         Oncologist at [facility] PET scan after                delivery, oral chemo at a minimum                IOL at 37 weeks  OB History    Gravida  3   Para  2   Term      Preterm      AB      Living        SAB      TAB      Ectopic      Multiple      Live Births            03-08-2013, 42 wks 1. M, 7lbs 2oz, Vaginal Delivery 01-10-2016, 37 wks 1. M, 7lbs 2oz, Vaginal Delivery  Past Medical History:  Diagnosis Date  . Breast mass 07/26/2017   Right breast lump x 1 month   . Eating disorder   . Family history of cholangiocarcinoma   . PONV (postoperative nausea and vomiting)    hypotensive passes out after epidurals  . right breast ca    diagnosed 09/18/19  . UTI (urinary tract infection)    Past Surgical History:  Procedure Laterality Date  . APPENDECTOMY    . AUGMENTATION MAMMAPLASTY Bilateral 2010  . BREAST AUGMENTATION  2010  . BREAST LUMPECTOMY WITH RADIOACTIVE SEED AND SENTINEL LYMPH NODE BIOPSY Right 10/18/2019   Procedure: RIGHT BREAST LUMPECTOMY WITH RADIOACTIVE SEED AND RIGHT AXILLARY SENTINEL LYMPH NODE BIOPSY;  Surgeon: Rolm Bookbinder, MD;  Location: [facility];  Service: General;  Laterality: Right;  . RE-EXCISION OF BREAST CANCER,SUPERIOR MARGINS Right 11/01/2019   Procedure: RE-EXCISION OF RIGHT BREAST CANCER MARGINS;  Surgeon: Rolm Bookbinder, MD;  Location: [facility];  Service: General;  Laterality: Right;   Family History: family history  includes Alcohol abuse in her maternal grandfather; Cancer (age of onset: 28) in her maternal grandfather; Heart disease in her maternal grandfather and paternal grandfather; Hypertension in her maternal grandfather and paternal grandfather. Social History:  reports that she has never smoked. She has never used smokeless tobacco. She reports current alcohol use. She reports that she does not use drugs.     Maternal Diabetes: No Genetic Screening: Normal Maternal Ultrasounds/Referrals: Normal Fetal Ultrasounds or other Referrals:  None Maternal Substance Abuse:  No Significant Maternal Medications:  None Significant Maternal Lab Results:  None Other Comments:  None  Review of Systems  Constitutional: Negative for fever.  Gastrointestinal: Negative for abdominal pain.   Maternal Medical History:  Contractions: Frequency: irregular.   Perceived  severity is mild.    Fetal activity: Perceived fetal activity is normal.    Prenatal complications: AMA, Breast cancer  Prenatal Complications - Diabetes: none.      Last menstrual period 09/20/2019. Maternal Exam:  Uterine Assessment: Contraction strength is mild.  Contraction frequency is irregular.   Abdomen: Patient reports no abdominal tenderness. Fetal presentation: vertex  Introitus: Normal vulva. Normal vagina.    Physical Exam  Cardiovascular: Normal rate and regular rhythm.  Respiratory: Effort normal and breath sounds normal.  GI: Soft.  Genitourinary:    Vulva normal.   Neurological: She is alert.  Psychiatric: Mood normal.    Prenatal labs: ABO, Rh: A/Positive/-- (12/21 0000) Antibody: Negative (12/21 0000) Rubella: Immune (12/21 0000) RPR: Nonreactive (12/21 0000)  HBsAg: Negative (12/21 0000)  HIV: Non-reactive (12/21 0000)  GBS: Negative/-- (06/03 0000)  One hour GCT 105 NIPT low risk  Assessment/Plan: Plan IOL with pitocin and AROM.  Epidural prn.  Pt has f/u with oncology 06/16/20 for PET scan   Logan Bores 06/05/2020, 9:32 PM

## 2020-06-06 ENCOUNTER — Inpatient Hospital Stay (HOSPITAL_COMMUNITY): Payer: BC Managed Care – PPO

## 2020-06-06 ENCOUNTER — Encounter (HOSPITAL_COMMUNITY): Payer: Self-pay | Admitting: Obstetrics and Gynecology

## 2020-06-06 ENCOUNTER — Inpatient Hospital Stay (HOSPITAL_COMMUNITY)
Admission: AD | Admit: 2020-06-06 | Discharge: 2020-06-08 | DRG: 806 | Disposition: A | Discharge status: Home / Self Care | Payer: BC Managed Care – PPO | Attending: Obstetrics and Gynecology | Admitting: Obstetrics and Gynecology

## 2020-06-06 ENCOUNTER — Inpatient Hospital Stay (HOSPITAL_COMMUNITY): Payer: BC Managed Care – PPO | Admitting: Anesthesiology

## 2020-06-06 ENCOUNTER — Other Ambulatory Visit: Payer: Self-pay

## 2020-06-06 DIAGNOSIS — Z20822 Contact with and (suspected) exposure to covid-19: Secondary | ICD-10-CM | POA: Diagnosis present

## 2020-06-06 DIAGNOSIS — C50911 Malignant neoplasm of unspecified site of right female breast: Secondary | ICD-10-CM | POA: Diagnosis present

## 2020-06-06 DIAGNOSIS — Z7982 Long term (current) use of aspirin: Secondary | ICD-10-CM | POA: Diagnosis not present

## 2020-06-06 DIAGNOSIS — Z3A37 37 weeks gestation of pregnancy: Secondary | ICD-10-CM | POA: Diagnosis not present

## 2020-06-06 DIAGNOSIS — C799 Secondary malignant neoplasm of unspecified site: Secondary | ICD-10-CM | POA: Diagnosis present

## 2020-06-06 DIAGNOSIS — O9A12 Malignant neoplasm complicating childbirth: Principal | ICD-10-CM | POA: Diagnosis present

## 2020-06-06 DIAGNOSIS — Z170 Estrogen receptor positive status [ER+]: Secondary | ICD-10-CM | POA: Diagnosis not present

## 2020-06-06 LAB — CBC
HCT: 38.1 % (ref 36.0–46.0)
Hemoglobin: 12.4 g/dL (ref 12.0–15.0)
MCH: 30.2 pg (ref 26.0–34.0)
MCHC: 32.5 g/dL (ref 30.0–36.0)
MCV: 92.9 fL (ref 80.0–100.0)
Platelets: 236 10*3/uL (ref 150–400)
RBC: 4.1 MIL/uL (ref 3.87–5.11)
RDW: 11.8 % (ref 11.5–15.5)
WBC: 7.7 10*3/uL (ref 4.0–10.5)
nRBC: 0 % (ref 0.0–0.2)

## 2020-06-06 LAB — TYPE AND SCREEN
ABO/RH(D): A POS
Antibody Screen: NEGATIVE

## 2020-06-06 LAB — ABO/RH: ABO/RH(D): A POS

## 2020-06-06 MED ORDER — ONDANSETRON HCL 4 MG PO TABS: 4.0000 mg | ORAL_TABLET | ORAL | Status: DC | PRN

## 2020-06-06 MED ORDER — LACTATED RINGERS IV SOLN: 500.0000 mL | INTRAVENOUS | Status: DC | PRN

## 2020-06-06 MED ORDER — LIDOCAINE HCL (PF) 1 % IJ SOLN: 30.0000 mL | INTRAMUSCULAR | Status: DC | PRN

## 2020-06-06 MED ORDER — SIMETHICONE 80 MG PO CHEW: 80.0000 mg | CHEWABLE_TABLET | ORAL | Status: DC | PRN

## 2020-06-06 MED ORDER — SENNOSIDES-DOCUSATE SODIUM 8.6-50 MG PO TABS
2.0000 | ORAL_TABLET | ORAL | Status: DC
  Administered 2020-06-07 (×2): 2 via ORAL
  Filled 2020-06-06 (×2): qty 2

## 2020-06-06 MED ORDER — ACETAMINOPHEN 325 MG PO TABS: 650.0000 mg | ORAL_TABLET | ORAL | Status: DC | PRN

## 2020-06-06 MED ORDER — OXYCODONE-ACETAMINOPHEN 5-325 MG PO TABS: 1.0000 | ORAL_TABLET | ORAL | Status: DC | PRN

## 2020-06-06 MED ORDER — HEPARIN SOD (PORK) LOCK FLUSH 10 UNIT/ML IV SOLN
150.0000 [IU] | Freq: Once | INTRAVENOUS | Status: DC
  Filled 2020-06-06 (×3): qty 15

## 2020-06-06 MED ORDER — ZOLPIDEM TARTRATE 5 MG PO TABS: 5.0000 mg | ORAL_TABLET | Freq: Every evening | ORAL | Status: DC | PRN

## 2020-06-06 MED ORDER — IBUPROFEN 600 MG PO TABS
600.0000 mg | ORAL_TABLET | Freq: Four times a day (QID) | ORAL | Status: DC
  Administered 2020-06-07 – 2020-06-08 (×7): 600 mg via ORAL
  Filled 2020-06-06 (×7): qty 1

## 2020-06-06 MED ORDER — BENZOCAINE-MENTHOL 20-0.5 % EX AERO: 1.0000 "application " | INHALATION_SPRAY | CUTANEOUS | Status: DC | PRN

## 2020-06-06 MED ORDER — DIBUCAINE (PERIANAL) 1 % EX OINT: 1.0000 "application " | TOPICAL_OINTMENT | CUTANEOUS | Status: DC | PRN

## 2020-06-06 MED ORDER — DIPHENHYDRAMINE HCL 50 MG/ML IJ SOLN: 12.5000 mg | INTRAMUSCULAR | Status: DC | PRN

## 2020-06-06 MED ORDER — PHENYLEPHRINE 40 MCG/ML (10ML) SYRINGE FOR IV PUSH (FOR BLOOD PRESSURE SUPPORT)
80.0000 ug | PREFILLED_SYRINGE | INTRAVENOUS | Status: DC | PRN
  Administered 2020-06-06: 80 ug via INTRAVENOUS

## 2020-06-06 MED ORDER — FENTANYL-BUPIVACAINE-NACL 0.5-0.125-0.9 MG/250ML-% EP SOLN
12.0000 mL/h | EPIDURAL | Status: DC | PRN
  Filled 2020-06-06: qty 250

## 2020-06-06 MED ORDER — EPHEDRINE 5 MG/ML INJ: 10.0000 mg | INTRAVENOUS | Status: DC | PRN

## 2020-06-06 MED ORDER — SODIUM CHLORIDE (PF) 0.9 % IJ SOLN
INTRAMUSCULAR | Status: DC | PRN
  Administered 2020-06-06: 12 mL/h via EPIDURAL

## 2020-06-06 MED ORDER — WITCH HAZEL-GLYCERIN EX PADS: 1.0000 "application " | MEDICATED_PAD | CUTANEOUS | Status: DC | PRN

## 2020-06-06 MED ORDER — OXYCODONE HCL 5 MG PO TABS: 10.0000 mg | ORAL_TABLET | ORAL | Status: DC | PRN

## 2020-06-06 MED ORDER — SOD CITRATE-CITRIC ACID 500-334 MG/5ML PO SOLN: 30.0000 mL | ORAL | Status: DC | PRN

## 2020-06-06 MED ORDER — COCONUT OIL OIL
1.0000 "application " | TOPICAL_OIL | Status: DC | PRN
  Administered 2020-06-07: 1 via TOPICAL

## 2020-06-06 MED ORDER — OXYTOCIN-SODIUM CHLORIDE 30-0.9 UT/500ML-% IV SOLN: 2.5000 [IU]/h | INTRAVENOUS | Status: DC

## 2020-06-06 MED ORDER — ONDANSETRON HCL 4 MG/2ML IJ SOLN: 4.0000 mg | INTRAMUSCULAR | Status: DC | PRN

## 2020-06-06 MED ORDER — PRENATAL MULTIVITAMIN CH
1.0000 | ORAL_TABLET | Freq: Every day | ORAL | Status: DC
  Administered 2020-06-07 – 2020-06-08 (×2): 1 via ORAL
  Filled 2020-06-06 (×2): qty 1

## 2020-06-06 MED ORDER — TETANUS-DIPHTH-ACELL PERTUSSIS 5-2.5-18.5 LF-MCG/0.5 IM SUSP: 0.5000 mL | Freq: Once | INTRAMUSCULAR | Status: DC

## 2020-06-06 MED ORDER — OXYCODONE HCL 5 MG PO TABS: 5.0000 mg | ORAL_TABLET | ORAL | Status: DC | PRN

## 2020-06-06 MED ORDER — PHENYLEPHRINE 40 MCG/ML (10ML) SYRINGE FOR IV PUSH (FOR BLOOD PRESSURE SUPPORT)
80.0000 ug | PREFILLED_SYRINGE | INTRAVENOUS | Status: DC | PRN
  Filled 2020-06-06: qty 10

## 2020-06-06 MED ORDER — LACTATED RINGERS IV SOLN: INTRAVENOUS | Status: DC

## 2020-06-06 MED ORDER — TERBUTALINE SULFATE 1 MG/ML IJ SOLN: 0.2500 mg | Freq: Once | INTRAMUSCULAR | Status: DC | PRN

## 2020-06-06 MED ORDER — OXYCODONE-ACETAMINOPHEN 5-325 MG PO TABS: 2.0000 | ORAL_TABLET | ORAL | Status: DC | PRN

## 2020-06-06 MED ORDER — LACTATED RINGERS IV SOLN
500.0000 mL | Freq: Once | INTRAVENOUS | Status: AC
  Administered 2020-06-06: 500 mL via INTRAVENOUS

## 2020-06-06 MED ORDER — ONDANSETRON HCL 4 MG/2ML IJ SOLN: 4.0000 mg | Freq: Four times a day (QID) | INTRAMUSCULAR | Status: DC | PRN

## 2020-06-06 MED ORDER — LIDOCAINE HCL (PF) 1 % IJ SOLN
INTRAMUSCULAR | Status: DC | PRN
  Administered 2020-06-06: 11 mL via EPIDURAL

## 2020-06-06 MED ORDER — AMMONIA AROMATIC IN INHA
RESPIRATORY_TRACT | Status: AC
  Filled 2020-06-06: qty 10

## 2020-06-06 MED ORDER — OXYTOCIN BOLUS FROM INFUSION
333.0000 mL | Freq: Once | INTRAVENOUS | Status: DC
  Administered 2020-06-06: 333 mL via INTRAVENOUS

## 2020-06-06 MED ORDER — DIPHENHYDRAMINE HCL 25 MG PO CAPS: 25.0000 mg | ORAL_CAPSULE | Freq: Four times a day (QID) | ORAL | Status: DC | PRN

## 2020-06-06 MED ORDER — OXYTOCIN-SODIUM CHLORIDE 30-0.9 UT/500ML-% IV SOLN
1.0000 m[IU]/min | INTRAVENOUS | Status: DC
  Administered 2020-06-06: 2 m[IU]/min via INTRAVENOUS
  Filled 2020-06-06: qty 500

## 2020-06-06 MED ORDER — FENTANYL CITRATE (PF) 100 MCG/2ML IJ SOLN: 50.0000 ug | INTRAMUSCULAR | Status: DC | PRN

## 2020-06-06 NOTE — Anesthesia Procedure Notes (Signed)
Epidural Patient location during procedure: OB Start time: 06/06/2020 4:57 PM End time: 06/06/2020 5:11 PM  Staffing Anesthesiologist: Lynda Rainwater, MD Performed: anesthesiologist   Preanesthetic Checklist Completed: patient identified, IV checked, site marked, risks and benefits discussed, surgical consent, monitors and equipment checked, pre-op evaluation and timeout performed  Epidural Patient position: sitting Prep: ChloraPrep Patient monitoring: heart rate, cardiac monitor, continuous pulse ox and blood pressure Approach: midline Location: L2-L3 Injection technique: LOR saline  Needle:  Needle type: Tuohy  Needle gauge: 17 G Needle length: 9 cm Needle insertion depth: 5 cm Catheter type: closed end flexible Catheter size: 20 Guage Catheter at skin depth: 9 cm Test dose: negative  Assessment Events: blood not aspirated, injection not painful, no injection resistance, no paresthesia and negative IV test  Additional Notes Reason for block:procedure for pain

## 2020-06-06 NOTE — Anesthesia Postprocedure Evaluation (Signed)
Anesthesia Post Note  Patient: Tabitha Brown  Procedure(s) Performed: AN AD [place]     Patient location during evaluation: Mother Baby Anesthesia Type: Epidural Level of consciousness: awake and alert Pain management: pain level controlled Vital Signs Assessment: post-procedure vital signs reviewed and stable Respiratory status: spontaneous breathing, nonlabored ventilation and respiratory function stable Cardiovascular status: stable Postop Assessment: no headache, no backache and epidural receding Anesthetic complications: no   No complications documented.  Last Vitals:  Vitals:   06/06/20 2113 06/06/20 2205  BP: 100/72 109/69  Pulse: 70 84  Resp: 16 16  Temp: 36.9 C   SpO2: 100% 99%    Last Pain:  Vitals:   06/06/20 2205  TempSrc: Oral  PainSc: 0-No pain   Pain Goal:                   Ladonna Vanorder

## 2020-06-06 NOTE — Progress Notes (Signed)
Patient ID: Tabitha Brown, female   DOB: 1981/03/09, 39 y.o.   MRN: [id number] Just making it in to start induction process. Feeling occasional contractions  Cervix 60/2-3/-2  AROM clear   Pitocin per protocol Epidural prn

## 2020-06-06 NOTE — Anesthesia Preprocedure Evaluation (Signed)
Anesthesia Evaluation  Patient identified by MRN, date of birth, ID band Patient awake    Reviewed: Allergy & Precautions, NPO status , Patient's Chart, lab work & pertinent test results  History of Anesthesia Complications (+) PONVNegative for: history of anesthetic complications  Airway Mallampati: II  TM Distance: >3 FB Neck ROM: Full    Dental no notable dental hx. (+) Dental Advisory Given   Pulmonary neg pulmonary ROS,    Pulmonary exam normal        Cardiovascular negative cardio ROS Normal cardiovascular exam     Neuro/Psych negative neurological ROS     GI/Hepatic negative GI ROS, Neg liver ROS,   Endo/Other  negative endocrine ROS  Renal/GU negative Renal ROS     Musculoskeletal negative musculoskeletal ROS (+)   Abdominal   Peds  Hematology negative hematology ROS (+)   Anesthesia Other Findings   Reproductive/Obstetrics (+) Pregnancy                             Anesthesia Physical  Anesthesia Plan  ASA: II  Anesthesia Plan: Epidural   Post-op Pain Management:    Induction:   PONV Risk Score and Plan: 3  Airway Management Planned: Natural Airway  Additional Equipment:   Intra-op Plan:   Post-operative Plan:   Informed Consent: I have reviewed the patients History and Physical, chart, labs and discussed the procedure including the risks, benefits and alternatives for the proposed anesthesia with the patient or authorized representative who has indicated his/her understanding and acceptance.       Plan Discussed with: Surgeon  Anesthesia Plan Comments:         Anesthesia Quick Evaluation

## 2020-06-07 ENCOUNTER — Encounter (HOSPITAL_COMMUNITY): Payer: Self-pay | Admitting: Obstetrics and Gynecology

## 2020-06-07 LAB — CBC
HCT: 32.1 % — ABNORMAL LOW (ref 36.0–46.0)
Hemoglobin: 10.9 g/dL — ABNORMAL LOW (ref 12.0–15.0)
MCH: 31.4 pg (ref 26.0–34.0)
MCHC: 34 g/dL (ref 30.0–36.0)
MCV: 92.5 fL (ref 80.0–100.0)
Platelets: 196 10*3/uL (ref 150–400)
RBC: 3.47 MIL/uL — ABNORMAL LOW (ref 3.87–5.11)
RDW: 11.8 % (ref 11.5–15.5)
WBC: 9.9 10*3/uL (ref 4.0–10.5)
nRBC: 0 % (ref 0.0–0.2)

## 2020-06-07 LAB — RPR: RPR Ser Ql: NONREACTIVE

## 2020-06-07 NOTE — Progress Notes (Signed)
Post Partum Day 1 Subjective: no complaints, up ad lib and tolerating PO.  Trying to pump but does not have much supply from her one breast she is able to pump.  Has to start chemo in 10 days so not sure how much to pursue.  Baby spitty with formula  Objective: Blood pressure 95/69, pulse 62, temperature 98.2 F (36.8 C), temperature source Oral, resp. rate 18, height 5' 1"  (1.549 m), weight 56.9 kg, last menstrual period 09/20/2019, SpO2 100 %, unknown if currently breastfeeding.  Physical Exam:  General: alert and cooperative Lochia: appropriate Uterine Fundus: firm   Recent Labs    06/06/20 1400 06/07/20 0357  HGB 12.4 10.9*  HCT 38.1 32.1*    Assessment/Plan: Plan for discharge tomorrow  Baby will be circumcised tomorrow given smaller weight and working on feeds D/w pt donor breastmilk and she is interested --RN informed and will let lactation know   LOS: 1 day   Logan Bores 06/07/2020, 9:10 AM

## 2020-06-07 NOTE — Lactation Note (Signed)
This note was copied from a baby's chart. Lactation Consultation Note  Patient Name: Tabitha Brown [id number] Date: 06/07/2020 Reason for consult: Initial assessment;Early term 37-38.6wks;Infant < 6lbs P3, 2 hour ETI female infant less than 6 lbs at  birth.  Mom's hx: AMA, unilateral mastectomy   Mom plan is to breastfeed infant for 10 days  only due to having upcoming chemotherapy treatments was dx with breast cancer 09/2019 . Mom's currently plan is to breastfeed infant first  and then supplement with donor breast milk, mom will try to  increase amount 12 -15 mls per feedings. Parents stated infant is vomiting with formula ( Similac Neosure 22 kcal with iron). LC discussed pace feeding infant donor breast milk, breast feed infant in  up right position and burp infant between feedings to help infant with volume.  Per parents, infant had 6 voids and 5 stools since birth. Mom is experienced at breastfeeding she BF her other two  Mom is currently BF on demand, 8 to 12+ times within 24 hours. LC did not observe latch at this time,  per mom, infant BF for 30 minutes,  less than 1 hour when LC entered the room. Per mom, she has been feeling a pinch when infant latches, LC suggest she call RN or [facility] to help with latching infant at breast. LC mention when infant latches, bring infant chin first, with nose and chin touching breast but nostrils free, top lip should be flange out, if mom is experiencing pain, break infant ceil and re-latch infant at breast. Mom will continue with doing STS. Mom doesn't want to use DEBP while in hospital prefers to latch infant at breast and supplement with donor breast milk.   LC discussed hand expression and per mom, she doesn't need any assistance or help with hand expression she knows how to do it.  Mom made aware of O/P services, breastfeeding support groups, community resources, and our phone # for post-discharge questions.  Maternal Data Formula Feeding for Exclusion:  No Has patient been taught Hand Expression?: Yes Does the patient have breastfeeding experience prior to this delivery?: Yes  Feeding Feeding Type: Donor Breast Milk Nipple Type: Slow - flow  LATCH Score                   Interventions Interventions: Breast feeding basics reviewed;Skin to skin;Hand express  Lactation Tools Discussed/Used WIC Program: No   Consult Status Consult Status: Follow-up Date: 06/08/20 Follow-up type: In-patient    Vicente Serene 06/07/2020, 5:47 PM

## 2020-06-08 MED ORDER — ACETAMINOPHEN 325 MG PO TABS: 650.0000 mg | ORAL_TABLET | ORAL | 1 refills | Status: DC | PRN

## 2020-06-08 MED ORDER — IBUPROFEN 600 MG PO TABS: 600.0000 mg | ORAL_TABLET | Freq: Four times a day (QID) | ORAL | 0 refills | Status: DC

## 2020-06-08 NOTE — Discharge Summary (Signed)
Postpartum Discharge Summary       Patient Name: Tabitha Brown DOB: 1981-06-15 MRN: [id number]  Date of admission: 06/06/2020 Delivery date:06/06/2020  Delivering provider: Carlynn Purl [facility]  Date of discharge: 06/08/2020  Admitting diagnosis: Indication for care in labor and delivery, antepartum [O75.9] Intrauterine pregnancy: [redacted]w[redacted]d    Secondary diagnosis:  Active Problems:   Indication for care in labor and delivery, antepartum   SVD (spontaneous vaginal delivery)  Additional problems: Breast cancer diagnosed in pregnancy AMA    Discharge diagnosis: Term Pregnancy Delivered                                              Post partum procedures:none Augmentation: AROM and Pitocin Complications: None  Hospital course: Induction of Labor With Vaginal Delivery   39y.o. yo G3P1001 at 40w1das admitted to the hospital 06/06/2020 for induction of labor.  Indication for induction: breast cancer .  Patient had an uncomplicated labor course as follows: Membrane Rupture Time/Date: 1:55 PM ,06/06/2020   Delivery Method:Vaginal, Spontaneous  Episiotomy: None  Lacerations:  None  Details of delivery can be found in separate delivery note.  Patient had a routine postpartum course. Patient is discharged home 06/08/20.  Newborn Data: Birth date:06/06/2020  Birth time:6:49 PM  Gender:Female  Living status:Living  Apgars:9 ,9  Weight:2489 g   Magnesium Sulfate received: No BMZ received: No Rhophylac:No  Physical exam  Vitals:   06/07/20 1452 06/07/20 1530 06/07/20 2033 06/08/20 0539  BP: 107/65  107/75 (!) 93/59  Pulse: 65  64 (!) 54  Resp: 18  18 16   Temp:  99 F (37.2 C) 98.3 F (36.8 C) 98 F (36.7 C)  TempSrc: Oral Oral Oral Oral  SpO2: 100%  100% 100%  Weight:      Height:       General: alert and cooperative Lochia: appropriate Uterine Fundus: firm  Labs: Lab Results  Component Value Date   WBC 9.9 06/07/2020   HGB 10.9 (L) 06/07/2020   HCT 32.1 (L)  06/07/2020   MCV 92.5 06/07/2020   PLT 196 06/07/2020   CMP Latest Ref Rng & Units 09/26/2019  Glucose 70 - 99 mg/dL 91  BUN 6 - 20 mg/dL 9  Creatinine 0.44 - 1.00 mg/dL 0.86  Sodium 135 - 145 mmol/L 142  Potassium 3.5 - 5.1 mmol/L 3.8  Chloride 98 - 111 mmol/L 105  CO2 22 - 32 mmol/L 27  Calcium 8.9 - 10.3 mg/dL 9.2  Total Protein 6.5 - 8.1 g/dL 7.1  Total Bilirubin 0.3 - 1.2 mg/dL 0.3  Alkaline Phos 38 - 126 U/L 59  AST 15 - 41 U/L 19  ALT 0 - 44 U/L 19   Edinburgh Score: Edinburgh Postnatal Depression Scale Screening Tool 06/07/2020  I have been able to laugh and see the funny side of things. 0  I have looked forward with enjoyment to things. 0  I have blamed myself unnecessarily when things went wrong. 0  I have been anxious or worried for no good reason. 0  I have felt scared or panicky for no good reason. 0  Things have been getting on top of me. 1  I have been so unhappy that I have had difficulty sleeping. 0  I have felt sad or miserable. 1  I have been so unhappy that I have been  crying. 1  The thought of harming myself has occurred to me. 0  Edinburgh Postnatal Depression Scale Total 3     After visit meds:  Allergies as of 06/08/2020      Reactions   Sulfa Antibiotics Hives   Sulfonylureas       Medication List    STOP taking these medications   aspirin EC 81 MG tablet   cyclobenzaprine 10 MG tablet Commonly known as: FLEXERIL     TAKE these medications   acetaminophen 325 MG tablet Commonly known as: Tylenol Take 2 tablets (650 mg total) by mouth every 4 (four) hours as needed (for pain scale < 4). What changed:   when to take this  reasons to take this   cetirizine 10 MG tablet Commonly known as: ZYRTEC Take 5 mg by mouth daily as needed for allergies.   COLLAGEN PO Take 1 Scoop by mouth daily.   docusate sodium 100 MG capsule Commonly known as: COLACE Take 100 mg by mouth daily.   folic acid 015 MCG tablet Commonly known as:  FOLVITE Take 400 mcg by mouth daily.   ibuprofen 600 MG tablet Commonly known as: ADVIL Take 1 tablet (600 mg total) by mouth every 6 (six) hours.   prenatal multivitamin Tabs tablet Take 1 tablet by mouth daily at 12 noon.   PROBIOTIC PRODUCT PO Take 1 capsule by mouth daily.   zinc gluconate 50 MG tablet Take 50 mg by mouth daily.        Discharge home in stable condition Infant Feeding: Bottle and Breast Infant Disposition:home with mother Discharge instruction: per After Visit Summary and Postpartum booklet. Activity: Advance as tolerated. Pelvic rest for 6 weeks.  Diet: routine diet Future Appointments:No future appointments. Follow up Visit:  Cardwell, Rentchler, DO. Schedule an appointment as soon as possible for a visit in 6 week(s).   Specialty: Obstetrics and Gynecology Why: postpartum  Contact information: [address] [facility] [place] [id number] [phone]                Please schedule this patient for a In person postpartum visit in 6 weeks with the following provider: MD. Additional Postpartum F/U:Postpartum Depression checkup   Delivery mode:  Vaginal, Spontaneous  Anticipated Birth Control:  will be undergoing chemo   06/08/2020 Logan Bores, MD

## 2020-06-08 NOTE — Progress Notes (Signed)
Post Partum Day 2 Subjective: no complaints, up ad lib and tolerating PO  Still breastfeeding and using some donor milk  Objective: Blood pressure (!) 93/59, pulse (!) 54, temperature 98 F (36.7 C), temperature source Oral, resp. rate 16, height 5' 1"  (1.549 m), weight 56.9 kg, last menstrual period 09/20/2019, SpO2 100 %, unknown if currently breastfeeding.  Physical Exam:  General: alert and cooperative Lochia: appropriate Uterine Fundus: firm   Recent Labs    06/06/20 1400 06/07/20 0357  HGB 12.4 10.9*  HCT 38.1 32.1*    Assessment/Plan: Discharge home  6 week postpartum, sees oncologist at [facility] next week   LOS: 2 days   Tabitha Brown 06/08/2020, 10:51 AM

## 2020-06-08 NOTE — Lactation Note (Signed)
This note was copied from a baby's chart. Lactation Consultation Note  Patient Name: Boy Jobina Maita [id number] Date: 06/08/2020 Reason for consult: Follow-up assessment;Early term 37-38.6wks;Infant < 6lbs;Infant weight loss;Other (Comment);Nipple pain/trauma (5 % weight loss / per mom baby having a circ at present/ rt breast mastectomy) per mom will have to stop breast feeding at 10 days due to tx. Baby is 80 hours old  Per mom left nipple is sore and showed it to the [facility] . LC noted the Left nipple to be pinky red/  No breakdown noted. Mom previously had been shown how to hand express, and has coconut oil. LC provided comfort gels x 6 days  ( and mom aware not to mix with coconut oil) and breast shells.  Mom plan is to continue to feed the baby at the breast and supplement with donor milk until D/C and search for donor milk in the community or check with the milk bank in [place].  LC reviewed ways to dry up her milk , cold cabbage until it wilts and change it out until milk drys up. Tight fitting bra.  LC encouraged mom to call [facility] office if having questions.    Maternal Data    Feeding    LATCH Score                   Interventions Interventions: Breast feeding basics reviewed  Lactation Tools Discussed/Used     Consult Status Consult Status: Complete Date: 06/08/20    Myer Haff 06/08/2020, 9:34 AM

## 2020-06-10 LAB — SURGICAL PATHOLOGY

## 2020-06-11 DIAGNOSIS — C50111 Malignant neoplasm of central portion of right female breast: Secondary | ICD-10-CM | POA: Diagnosis not present

## 2020-06-11 DIAGNOSIS — Z170 Estrogen receptor positive status [ER+]: Secondary | ICD-10-CM | POA: Diagnosis not present

## 2020-06-11 DIAGNOSIS — M79622 Pain in left upper arm: Secondary | ICD-10-CM | POA: Diagnosis not present

## 2020-06-17 DIAGNOSIS — C50111 Malignant neoplasm of central portion of right female breast: Secondary | ICD-10-CM | POA: Diagnosis not present

## 2020-06-17 DIAGNOSIS — Z170 Estrogen receptor positive status [ER+]: Secondary | ICD-10-CM | POA: Diagnosis not present

## 2020-06-23 DIAGNOSIS — D225 Melanocytic nevi of trunk: Secondary | ICD-10-CM | POA: Diagnosis not present

## 2020-06-23 DIAGNOSIS — L821 Other seborrheic keratosis: Secondary | ICD-10-CM | POA: Diagnosis not present

## 2020-07-03 ENCOUNTER — Other Ambulatory Visit (HOSPITAL_COMMUNITY): Payer: Self-pay | Admitting: Oncology

## 2020-07-03 DIAGNOSIS — C50111 Malignant neoplasm of central portion of right female breast: Secondary | ICD-10-CM | POA: Diagnosis not present

## 2020-07-03 DIAGNOSIS — Z170 Estrogen receptor positive status [ER+]: Secondary | ICD-10-CM | POA: Diagnosis not present

## 2020-07-03 DIAGNOSIS — C50919 Malignant neoplasm of unspecified site of unspecified female breast: Secondary | ICD-10-CM | POA: Diagnosis not present

## 2020-07-09 ENCOUNTER — Encounter: Payer: Self-pay | Admitting: Family Medicine

## 2020-07-09 DIAGNOSIS — M542 Cervicalgia: Secondary | ICD-10-CM | POA: Diagnosis not present

## 2020-07-09 DIAGNOSIS — M25512 Pain in left shoulder: Secondary | ICD-10-CM | POA: Diagnosis not present

## 2020-07-10 DIAGNOSIS — M542 Cervicalgia: Secondary | ICD-10-CM | POA: Diagnosis not present

## 2020-07-17 DIAGNOSIS — Z170 Estrogen receptor positive status [ER+]: Secondary | ICD-10-CM | POA: Diagnosis not present

## 2020-07-17 DIAGNOSIS — Z853 Personal history of malignant neoplasm of breast: Secondary | ICD-10-CM | POA: Diagnosis not present

## 2020-07-17 DIAGNOSIS — C50919 Malignant neoplasm of unspecified site of unspecified female breast: Secondary | ICD-10-CM | POA: Diagnosis not present

## 2020-07-18 DIAGNOSIS — Z170 Estrogen receptor positive status [ER+]: Secondary | ICD-10-CM | POA: Diagnosis not present

## 2020-07-18 DIAGNOSIS — C50919 Malignant neoplasm of unspecified site of unspecified female breast: Secondary | ICD-10-CM | POA: Diagnosis not present

## 2020-07-21 DIAGNOSIS — M542 Cervicalgia: Secondary | ICD-10-CM | POA: Diagnosis not present

## 2020-07-22 DIAGNOSIS — Z1389 Encounter for screening for other disorder: Secondary | ICD-10-CM | POA: Diagnosis not present

## 2020-07-22 DIAGNOSIS — Z3009 Encounter for other general counseling and advice on contraception: Secondary | ICD-10-CM | POA: Diagnosis not present

## 2020-07-29 DIAGNOSIS — C50919 Malignant neoplasm of unspecified site of unspecified female breast: Secondary | ICD-10-CM | POA: Diagnosis not present

## 2020-07-29 DIAGNOSIS — Z170 Estrogen receptor positive status [ER+]: Secondary | ICD-10-CM | POA: Diagnosis not present

## 2020-07-29 DIAGNOSIS — Z79811 Long term (current) use of aromatase inhibitors: Secondary | ICD-10-CM | POA: Diagnosis not present

## 2020-07-29 DIAGNOSIS — D0511 Intraductal carcinoma in situ of right breast: Secondary | ICD-10-CM | POA: Diagnosis not present

## 2020-08-07 DIAGNOSIS — N651 Disproportion of reconstructed breast: Secondary | ICD-10-CM | POA: Diagnosis not present

## 2020-08-07 DIAGNOSIS — Z45811 Encounter for adjustment or removal of right breast implant: Secondary | ICD-10-CM | POA: Diagnosis not present

## 2020-08-07 DIAGNOSIS — Z170 Estrogen receptor positive status [ER+]: Secondary | ICD-10-CM | POA: Diagnosis not present

## 2020-08-07 DIAGNOSIS — Z8249 Family history of ischemic heart disease and other diseases of the circulatory system: Secondary | ICD-10-CM | POA: Diagnosis not present

## 2020-08-07 DIAGNOSIS — Z9013 Acquired absence of bilateral breasts and nipples: Secondary | ICD-10-CM | POA: Diagnosis not present

## 2020-08-07 DIAGNOSIS — Z853 Personal history of malignant neoplasm of breast: Secondary | ICD-10-CM | POA: Diagnosis not present

## 2020-08-07 DIAGNOSIS — Z9882 Breast implant status: Secondary | ICD-10-CM | POA: Diagnosis not present

## 2020-08-07 DIAGNOSIS — F419 Anxiety disorder, unspecified: Secondary | ICD-10-CM | POA: Diagnosis not present

## 2020-08-07 DIAGNOSIS — Z9011 Acquired absence of right breast and nipple: Secondary | ICD-10-CM | POA: Diagnosis not present

## 2020-08-07 DIAGNOSIS — Z79899 Other long term (current) drug therapy: Secondary | ICD-10-CM | POA: Diagnosis not present

## 2020-08-07 DIAGNOSIS — Z882 Allergy status to sulfonamides status: Secondary | ICD-10-CM | POA: Diagnosis not present

## 2020-08-07 DIAGNOSIS — Z9049 Acquired absence of other specified parts of digestive tract: Secondary | ICD-10-CM | POA: Diagnosis not present

## 2020-08-07 DIAGNOSIS — Z421 Encounter for breast reconstruction following mastectomy: Secondary | ICD-10-CM | POA: Diagnosis not present

## 2020-08-07 DIAGNOSIS — Z45812 Encounter for adjustment or removal of left breast implant: Secondary | ICD-10-CM | POA: Diagnosis not present

## 2020-08-07 DIAGNOSIS — Z483 Aftercare following surgery for neoplasm: Secondary | ICD-10-CM | POA: Diagnosis not present

## 2020-08-07 DIAGNOSIS — C50919 Malignant neoplasm of unspecified site of unspecified female breast: Secondary | ICD-10-CM | POA: Diagnosis not present

## 2020-08-15 DIAGNOSIS — Z9011 Acquired absence of right breast and nipple: Secondary | ICD-10-CM | POA: Diagnosis not present

## 2020-08-15 DIAGNOSIS — N6489 Other specified disorders of breast: Secondary | ICD-10-CM | POA: Diagnosis not present

## 2020-08-15 DIAGNOSIS — C50919 Malignant neoplasm of unspecified site of unspecified female breast: Secondary | ICD-10-CM | POA: Diagnosis not present

## 2020-08-15 DIAGNOSIS — Z170 Estrogen receptor positive status [ER+]: Secondary | ICD-10-CM | POA: Diagnosis not present

## 2020-08-26 DIAGNOSIS — Z170 Estrogen receptor positive status [ER+]: Secondary | ICD-10-CM | POA: Diagnosis not present

## 2020-08-26 DIAGNOSIS — Z9011 Acquired absence of right breast and nipple: Secondary | ICD-10-CM | POA: Diagnosis not present

## 2020-08-26 DIAGNOSIS — C50919 Malignant neoplasm of unspecified site of unspecified female breast: Secondary | ICD-10-CM | POA: Diagnosis not present

## 2020-09-08 DIAGNOSIS — C50919 Malignant neoplasm of unspecified site of unspecified female breast: Secondary | ICD-10-CM | POA: Diagnosis not present

## 2020-09-08 DIAGNOSIS — Z9011 Acquired absence of right breast and nipple: Secondary | ICD-10-CM | POA: Diagnosis not present

## 2020-09-08 DIAGNOSIS — N6489 Other specified disorders of breast: Secondary | ICD-10-CM | POA: Diagnosis not present

## 2020-09-08 DIAGNOSIS — Z170 Estrogen receptor positive status [ER+]: Secondary | ICD-10-CM | POA: Diagnosis not present

## 2020-09-17 DIAGNOSIS — R102 Pelvic and perineal pain: Secondary | ICD-10-CM | POA: Diagnosis not present

## 2020-09-17 DIAGNOSIS — N9089 Other specified noninflammatory disorders of vulva and perineum: Secondary | ICD-10-CM | POA: Diagnosis not present

## 2020-09-23 DIAGNOSIS — Z170 Estrogen receptor positive status [ER+]: Secondary | ICD-10-CM | POA: Diagnosis not present

## 2020-09-23 DIAGNOSIS — C50919 Malignant neoplasm of unspecified site of unspecified female breast: Secondary | ICD-10-CM | POA: Diagnosis not present

## 2020-09-24 DIAGNOSIS — D225 Melanocytic nevi of trunk: Secondary | ICD-10-CM | POA: Diagnosis not present

## 2020-09-24 DIAGNOSIS — B078 Other viral warts: Secondary | ICD-10-CM | POA: Diagnosis not present

## 2020-10-20 DIAGNOSIS — N6489 Other specified disorders of breast: Secondary | ICD-10-CM | POA: Diagnosis not present

## 2020-10-20 DIAGNOSIS — Z9011 Acquired absence of right breast and nipple: Secondary | ICD-10-CM | POA: Diagnosis not present

## 2020-10-20 DIAGNOSIS — Z170 Estrogen receptor positive status [ER+]: Secondary | ICD-10-CM | POA: Diagnosis not present

## 2020-10-20 DIAGNOSIS — C50919 Malignant neoplasm of unspecified site of unspecified female breast: Secondary | ICD-10-CM | POA: Diagnosis not present

## 2020-10-21 DIAGNOSIS — Z79811 Long term (current) use of aromatase inhibitors: Secondary | ICD-10-CM | POA: Diagnosis not present

## 2020-10-21 DIAGNOSIS — F419 Anxiety disorder, unspecified: Secondary | ICD-10-CM | POA: Diagnosis not present

## 2020-10-21 DIAGNOSIS — Z9011 Acquired absence of right breast and nipple: Secondary | ICD-10-CM | POA: Diagnosis not present

## 2020-10-21 DIAGNOSIS — E2839 Other primary ovarian failure: Secondary | ICD-10-CM | POA: Diagnosis not present

## 2020-10-21 DIAGNOSIS — C50311 Malignant neoplasm of lower-inner quadrant of right female breast: Secondary | ICD-10-CM | POA: Diagnosis not present

## 2020-10-21 DIAGNOSIS — Z170 Estrogen receptor positive status [ER+]: Secondary | ICD-10-CM | POA: Diagnosis not present

## 2020-10-21 DIAGNOSIS — C50919 Malignant neoplasm of unspecified site of unspecified female breast: Secondary | ICD-10-CM | POA: Diagnosis not present

## 2020-10-21 DIAGNOSIS — Z23 Encounter for immunization: Secondary | ICD-10-CM | POA: Diagnosis not present

## 2020-10-21 DIAGNOSIS — F32A Depression, unspecified: Secondary | ICD-10-CM | POA: Diagnosis not present

## 2020-10-29 ENCOUNTER — Other Ambulatory Visit: Payer: Self-pay

## 2020-10-30 ENCOUNTER — Ambulatory Visit (INDEPENDENT_AMBULATORY_CARE_PROVIDER_SITE_OTHER): Payer: BC Managed Care – PPO | Admitting: Adult Health

## 2020-10-30 ENCOUNTER — Encounter: Payer: Self-pay | Admitting: Adult Health

## 2020-10-30 ENCOUNTER — Ambulatory Visit (INDEPENDENT_AMBULATORY_CARE_PROVIDER_SITE_OTHER): Payer: BC Managed Care – PPO

## 2020-10-30 VITALS — BP 100/62 | HR 74 | Temp 97.8°F | Ht 61.0 in | Wt 102.0 lb

## 2020-10-30 DIAGNOSIS — M542 Cervicalgia: Secondary | ICD-10-CM

## 2020-10-30 DIAGNOSIS — R0789 Other chest pain: Secondary | ICD-10-CM | POA: Diagnosis not present

## 2020-10-30 DIAGNOSIS — I10 Essential (primary) hypertension: Secondary | ICD-10-CM | POA: Diagnosis not present

## 2020-10-30 DIAGNOSIS — C50919 Malignant neoplasm of unspecified site of unspecified female breast: Secondary | ICD-10-CM | POA: Diagnosis not present

## 2020-10-30 NOTE — Progress Notes (Signed)
Subjective:    Patient ID: Tabitha Brown, female    DOB: [birth year], 39 y.o.   MRN: [id number]  HPI 39 year old female who  has a past medical history of Breast mass (07/26/2017), Eating disorder, Family history of cholangiocarcinoma, PONV (postoperative nausea and vomiting), right breast ca, and UTI (urinary tract infection).  She presents to the office today for an acute issue that has been present intermittently over the last two weeks. Her symptoms include that of bilateral upper back/shoulder/neck pain that is described as stiff. She took three days of Flexeril and this has helped   She also reports that over the last two weeks she has been experiencing a tightness in her chest intermittently. She denies shortness of breath or chest pain. She reports that when she is running and resting that the sensation goes away. When she wakes up in the morning the sensation is not there but as the day goes on she feels this tightness the rest of the day.   She denies worsening cough ( has chronic cough d/t PND/Seasonal allergies), fevers, chills, or feeling ill.   Started treatment about 5 months ago for breast cancer and has had multipe surgeries d/t breast cancer over the last year    Review of Systems See HPI   Past Medical History:  Diagnosis Date   Breast mass 07/26/2017   Right breast lump x 1 month    Eating disorder    Family history of cholangiocarcinoma    PONV (postoperative nausea and vomiting)    hypotensive passes out after epidurals   right breast ca    diagnosed 09/18/19   UTI (urinary tract infection)     Social History   Socioeconomic History   Marital status: Married    Spouse name: Akane Tessier   Number of children: 2   Years of education: Not on file   Highest education level: Not on file  Occupational History   Not on file  Tobacco Use   Smoking status: Never Smoker   Smokeless tobacco: Never Used  Vaping Use   Vaping Use: Never used    Substance and Sexual Activity   Alcohol use: Yes    Comment: 1-2   Drug use: No   Sexual activity: Yes  Other Topics Concern   Not on file  Social History Narrative   Not on file   Social Determinants of Health   Financial Resource Strain:    Difficulty of Paying Living Expenses: Not on file  Food Insecurity:    Worried About [employer] in the Last Year: Not on file   Ran Out of Food in the Last Year: Not on file  Transportation Needs:    Lack of Transportation (Medical): Not on file   Lack of Transportation (Non-Medical): Not on file  Physical Activity:    Days of Exercise per Week: Not on file   Minutes of Exercise per Session: Not on file  Stress:    Feeling of Stress : Not on file  Social Connections:    Frequency of Communication with Friends and Family: Not on file   Frequency of Social Gatherings with Friends and Family: Not on file   Attends Religious Services: Not on file   Active Member of Clubs or Organizations: Not on file   Attends Archivist Meetings: Not on file   Marital Status: Not on file  Intimate Partner Violence:    Fear of Current or Ex-Partner: Not on file  Emotionally Abused: Not on file   Physically Abused: Not on file   Sexually Abused: Not on file    Past Surgical History:  Procedure Laterality Date   APPENDECTOMY     AUGMENTATION MAMMAPLASTY Bilateral 2010   BREAST AUGMENTATION  2010   BREAST LUMPECTOMY WITH RADIOACTIVE SEED AND SENTINEL LYMPH NODE BIOPSY Right 10/18/2019   Procedure: RIGHT BREAST LUMPECTOMY WITH RADIOACTIVE SEED AND RIGHT AXILLARY SENTINEL LYMPH NODE BIOPSY;  Surgeon: Rolm Bookbinder, MD;  Location: [facility];  Service: General;  Laterality: Right;   RE-EXCISION OF BREAST CANCER,SUPERIOR MARGINS Right 11/01/2019   Procedure: RE-EXCISION OF RIGHT BREAST CANCER MARGINS;  Surgeon: Rolm Bookbinder, MD;  Location: [facility];  Service: General;  Laterality: Right;     Family History  Problem Relation Age of Onset   Alcohol abuse Maternal Grandfather        bile duct cancer   Heart disease Maternal Grandfather    Cancer Maternal Grandfather 89       bile duct cancer   Hypertension Maternal Grandfather    Hypertension Paternal Grandfather    Heart disease Paternal Grandfather     Allergies  Allergen Reactions   Sulfa Antibiotics Hives   Sulfonylureas     Current Outpatient Medications on File Prior to Visit  Medication Sig Dispense Refill   ascorbic acid (VITAMIN C) 500 MG tablet Take 500 mg by mouth daily.     BIOTIN PO Take by mouth.     cholecalciferol (VITAMIN D3) 25 MCG (1000 UNIT) tablet Take 1,000 Units by mouth daily.     Cyanocobalamin (VITAMIN B-12 PO) Take by mouth.     ibuprofen (ADVIL) 600 MG tablet Take 1 tablet (600 mg total) by mouth every 6 (six) hours. 30 tablet 0   letrozole (FEMARA) 2.5 MG tablet Take 2.5 mg by mouth daily.     Leuprolide Acetate (LUPRON DEPOT, 44-MONTH, IM) Inject into the muscle.     Multiple Vitamin (MULTIVITAMIN ADULT PO) Take by mouth.     PROBIOTIC PRODUCT PO Take 1 capsule by mouth daily.      No current facility-administered medications on file prior to visit.    BP 100/62 (BP Location: Left Arm, Patient Position: Sitting, Cuff Size: Normal)    Pulse 74    Temp 97.8 F (36.6 C) (Oral)    Ht 5' 1"  (1.549 m)    Wt 102 lb (46.3 kg)    LMP  (LMP Unknown)    SpO2 98%    Breastfeeding No Comment: Lupron injections   BMI 19.27 kg/m       Objective:   Physical Exam Vitals and nursing note reviewed.  Constitutional:      Appearance: Normal appearance.  Cardiovascular:     Rate and Rhythm: Normal rate and regular rhythm.     Pulses: Normal pulses.     Heart sounds: Normal heart sounds.  Pulmonary:     Effort: Pulmonary effort is normal.     Breath sounds: Normal breath sounds.  Musculoskeletal:        General: Tenderness (muscular tenderness throughout shoulders and back of  neck) present.  Skin:    General: Skin is warm and dry.     Capillary Refill: Capillary refill takes less than 2 seconds.  Neurological:     General: No focal deficit present.     Mental Status: She is alert and oriented to person, place, and time.  Psychiatric:        Mood and Affect:  Mood normal.        Behavior: Behavior normal.        Thought Content: Thought content normal.        Judgment: Judgment normal.       Assessment & Plan:  1. Neck pain - Appears muscular. She can continue with her prescribed muscle relaxer and use a heating pad as needed   2. Chest tightness  - DG Chest 2 View; Future - EKG 12-Lead; Future- Sinus Rhythm, Rate 78   Time spent on chart review, time with patient; discussion of symptoms home monitoring, treatment, follow up plan, and documentation 30 minutes  Dorothyann Peng, NP

## 2020-10-30 NOTE — Patient Instructions (Signed)
It was great meeting you today   The neck and back pain is muscular. You can continue flexeril and can also use a heating pad to help with symptoms  Your EKG was normal and I will follow up with you once I get your chest xray back

## 2020-11-03 ENCOUNTER — Encounter: Payer: Self-pay | Admitting: Adult Health

## 2020-11-04 ENCOUNTER — Other Ambulatory Visit: Payer: Self-pay | Admitting: Adult Health

## 2020-11-10 ENCOUNTER — Ambulatory Visit: Payer: BC Managed Care – PPO | Admitting: Family Medicine

## 2020-11-13 DIAGNOSIS — Z9011 Acquired absence of right breast and nipple: Secondary | ICD-10-CM | POA: Diagnosis not present

## 2020-11-13 DIAGNOSIS — Z01818 Encounter for other preprocedural examination: Secondary | ICD-10-CM | POA: Diagnosis not present

## 2020-11-13 DIAGNOSIS — Z170 Estrogen receptor positive status [ER+]: Secondary | ICD-10-CM | POA: Diagnosis not present

## 2020-11-13 DIAGNOSIS — C50011 Malignant neoplasm of nipple and areola, right female breast: Secondary | ICD-10-CM | POA: Diagnosis not present

## 2020-11-14 ENCOUNTER — Other Ambulatory Visit: Payer: Self-pay

## 2020-11-14 ENCOUNTER — Encounter: Payer: Self-pay | Admitting: Family Medicine

## 2020-11-14 ENCOUNTER — Ambulatory Visit (INDEPENDENT_AMBULATORY_CARE_PROVIDER_SITE_OTHER): Payer: BC Managed Care – PPO | Admitting: Family Medicine

## 2020-11-14 VITALS — BP 110/70 | HR 82 | Temp 97.7°F | Resp 12 | Ht 61.0 in | Wt 101.4 lb

## 2020-11-14 DIAGNOSIS — M542 Cervicalgia: Secondary | ICD-10-CM

## 2020-11-14 DIAGNOSIS — J45990 Exercise induced bronchospasm: Secondary | ICD-10-CM | POA: Diagnosis not present

## 2020-11-14 DIAGNOSIS — R0789 Other chest pain: Secondary | ICD-10-CM | POA: Diagnosis not present

## 2020-11-14 MED ORDER — FLOVENT HFA 110 MCG/ACT IN AERO: 2.0000 | INHALATION_SPRAY | Freq: Two times a day (BID) | RESPIRATORY_TRACT | 3 refills | Status: DC

## 2020-11-14 MED ORDER — AEROCHAMBER PLUS MISC: 1 refills | Status: DC

## 2020-11-14 MED ORDER — ALBUTEROL SULFATE HFA 108 (90 BASE) MCG/ACT IN AERS: 2.0000 | INHALATION_SPRAY | Freq: Four times a day (QID) | RESPIRATORY_TRACT | 2 refills | Status: DC | PRN

## 2020-11-14 NOTE — Progress Notes (Addendum)
Chief Complaint  Patient presents with  . Follow-up   HPI: [name] is a 39 y.o. female with hx of right breast cancer currently on ovarian suppression therapy here today concerned about 6 weeks of chest pain,R>L, which seems radiated to anterior neck. Sometimes she feels lie she is "chocking." It is hard to explain type of pain, some tightness.  Pain is intermitent, it seems to move around thoracic area (right upper chest,upper back,lateral aspect of neck) Local massage seems to help.  She has not noted skin rash,edema,or erythema. No limitation of ROM.  CXR on 10/30/20 was negative for cardiopulmonary disease.  She feels fine when she gets up, pain does not interfere with sleep or daily activities. Pain starts though the day. No history of trauma. Problem seems to be stable. Flexeril and Advil help.  She is very concerned about the possibility of this pain being related with breast cancer.  Sh has followed with ortho due to neck pain radiated to LUE + numbness sensation, resolved. Cervical MRI was done around 7/2021and showed some degenerative changes.I do not have report of cervical MRI.  She has undergone a few surgical procedure of right-sided chest.Planning on having right replacement of tissue expander with permanent implant on 11/20/2020.  Right preauricular burning like sensation and right upper back numbness sensation. Negative for odynophagia,dysphagia,stridor,or oral lesions.  She is following regularly with oncologist. Chest MRI was done in 07/2020: There is marked background parenchymal enhancement. Extreme fibroglandular tissue.  Right breast  Postoperative changes of mastectomy. Subpectoral breast implant, incompletely evaluated on this nondedicated series. No suspicious enhancement, masses, or axillary adenopathy.  Left breast  Subpectoral breast implant, incompletely evaluated on this nondedicated series. There is no evidence of suspicious  masses or abnormal enhancement. A few prominent left axillary lymph nodes are stable compared to prior MRI 09/05/2019.  She is currently on Lurpon and Letrozole.  History of exercise-induced asthma. She has not used an inhaler in a while. Nonproductive cough, exacerbated by exercise. Wheezing and some shortness of breath, the last when she is not sure if it is related with anxiety.  Review of Systems  Constitutional: Positive for fatigue. Negative for activity change, appetite change and fever.  HENT: Negative for mouth sores and nosebleeds.   Gastrointestinal: Negative for abdominal pain, nausea and vomiting.  Allergic/Immunologic: Positive for environmental allergies.  Neurological: Negative for syncope, weakness and headaches.  Rest see pertinent positives and negatives per HPI.  Current Outpatient Medications on File Prior to Visit  Medication Sig Dispense Refill  . ascorbic acid (VITAMIN C) 500 MG tablet Take 500 mg by mouth daily.    Marland Kitchen BIOTIN PO Take by mouth.    . cholecalciferol (VITAMIN D3) 25 MCG (1000 UNIT) tablet Take 1,000 Units by mouth daily.    . Cyanocobalamin (VITAMIN B-12 PO) Take by mouth.    Marland Kitchen ibuprofen (ADVIL) 600 MG tablet Take 1 tablet (600 mg total) by mouth every 6 (six) hours. 30 tablet 0  . letrozole (FEMARA) 2.5 MG tablet Take 2.5 mg by mouth daily.    Marland Kitchen Leuprolide Acetate (LUPRON DEPOT, 17-MONTH, IM) Inject into the muscle.    . Multiple Vitamin (MULTIVITAMIN ADULT PO) Take by mouth.    Marland Kitchen PROBIOTIC PRODUCT PO Take 1 capsule by mouth daily.      No current facility-administered medications on file prior to visit.   Past Medical History:  Diagnosis Date  . Breast mass 07/26/2017   Right breast lump x 1 month   .  Eating disorder   . Family history of cholangiocarcinoma   . PONV (postoperative nausea and vomiting)    hypotensive passes out after epidurals  . right breast ca    diagnosed 09/18/19  . UTI (urinary tract infection)    Allergies    Allergen Reactions  . Sulfa Antibiotics Hives  . Sulfonylureas     Social History   Socioeconomic History  . Marital status: Married    Spouse name: Tabitha Brown  . Number of children: 2  . Years of education: Not on file  . Highest education level: Not on file  Occupational History  . Not on file  Tobacco Use  . Smoking status: Never Smoker  . Smokeless tobacco: Never Used  Vaping Use  . Vaping Use: Never used  Substance and Sexual Activity  . Alcohol use: Yes    Comment: 1-2  . Drug use: No  . Sexual activity: Yes  Other Topics Concern  . Not on file  Social History Narrative  . Not on file   Social Determinants of Health   Financial Resource Strain:   . Difficulty of Paying Living Expenses: Not on file  Food Insecurity:   . Worried About Charity fundraiser in the Last Year: Not on file  . Ran Out of Food in the Last Year: Not on file  Transportation Needs:   . Lack of Transportation (Medical): Not on file  . Lack of Transportation (Non-Medical): Not on file  Physical Activity:   . Days of Exercise per Week: Not on file  . Minutes of Exercise per Session: Not on file  Stress:   . Feeling of Stress : Not on file  Social Connections:   . Frequency of Communication with Friends and Family: Not on file  . Frequency of Social Gatherings with Friends and Family: Not on file  . Attends Religious Services: Not on file  . Active Member of Clubs or Organizations: Not on file  . Attends Archivist Meetings: Not on file  . Marital Status: Not on file    Vitals:   11/14/20 1157  BP: 110/70  Pulse: 82  Resp: 12  Temp: 97.7 F (36.5 C)  SpO2: 96%   Body mass index is 19.16 kg/m.  Physical Exam Vitals and nursing note reviewed.  Constitutional:      General: She is not in acute distress.    Appearance: She is well-developed.  HENT:     Head: Normocephalic and atraumatic.  Eyes:     Conjunctiva/sclera: Conjunctivae normal.     Pupils: Pupils  are equal, round, and reactive to light.  Neck:   Cardiovascular:     Rate and Rhythm: Normal rate and regular rhythm.     Heart sounds: No murmur heard.   Pulmonary:     Effort: Pulmonary effort is normal. No respiratory distress.     Breath sounds: Normal breath sounds.  Abdominal:     Palpations: Abdomen is soft. There is no hepatomegaly or mass.     Tenderness: There is no abdominal tenderness.  Musculoskeletal:       Back:  Lymphadenopathy:     Head:     Right side of head: No submandibular adenopathy.     Left side of head: No submandibular adenopathy.     Cervical: No cervical adenopathy.  Skin:    General: Skin is warm.     Findings: No erythema or rash.  Neurological:     Mental Status: She is  alert and oriented to person, place, and time.     Cranial Nerves: No cranial nerve deficit.     Gait: Gait normal.  Psychiatric:     Comments: Well groomed, good eye contact.   ASSESSMENT AND PLAN:  Tabitha Brown was seen today for follow-up.  Diagnoses and all orders for this visit:  Asthma, exercise induced She is exercising several times per week, problem is not well controlled. She agrees with trying daily ICS, we discussed some side effects. Spacer will help to deliver medication more effectively. Albuterol 2 puffs every 4-6 hours as needed for wheezing and shortness of breath. I asked her to let me know in about 3 to 4 weeks if these medications have helped with some of her symptoms.  -     fluticasone (FLOVENT HFA) 110 MCG/ACT inhaler; Inhale 2 puffs into the lungs in the morning and at bedtime. -     Spacer/Aero-Holding Chambers (AEROCHAMBER PLUS) inhaler; Use as instructed to use with inahaler. -     albuterol (VENTOLIN HFA) 108 (90 Base) MCG/ACT inhaler; Inhale 2 puffs into the lungs every 6 (six) hours as needed for wheezing or shortness of breath.  Chest tightness History and examination do not suggest a serious process. She has had chest imaging during the  past few months and negative for suspicious lesion. Lupron and letrozole could certainly be aggravating problem, musculoskeletal pain/arthralgias are common side effects. Asthma can also be a contributing factor. I do not think further imaging needed at this time.  Neck pain Intermittent radicular like symptoms (numbness and burning sensation). She can continue Flexeril 10 mg daily as needed. I think she may benefit from chiropractor evaluation and treatment.  For now I do not think systemic steroids will help at this time. We could consider Gabapentin to treat musculoskeletal pain and burning sensation. Caution with frequent NSAID's use.  Return if symptoms worsen or fail to improve.   Brailyn Delman G. [place], MD  [facility] [address]. [place] office.  A few things to remember from today's visit:  Asthma, exercise induced - Plan: fluticasone (FLOVENT HFA) 110 MCG/ACT inhaler, Spacer/Aero-Holding Chambers (AEROCHAMBER PLUS) inhaler, albuterol (VENTOLIN HFA) 108 (90 Base) MCG/ACT inhaler  If you need refills please call your pharmacy. Do not use My Chart to request refills or for acute issues that need immediate attention.   Let me know in 3-4 weeks if Flovent inh helps with cough and tightness. I think chest pain is muscle related.   Please be sure medication list is accurate. If a new problem present, please set up appointment sooner than planned today.

## 2020-11-14 NOTE — Patient Instructions (Signed)
A few things to remember from today's visit:  Asthma, exercise induced - Plan: fluticasone (FLOVENT HFA) 110 MCG/ACT inhaler, Spacer/Aero-Holding Chambers (AEROCHAMBER PLUS) inhaler, albuterol (VENTOLIN HFA) 108 (90 Base) MCG/ACT inhaler  If you need refills please call your pharmacy. Do not use My Chart to request refills or for acute issues that need immediate attention.   Let me know in 3-4 weeks if Flovent inh helps with cough and tightness. I think chest pain is muscle related.   Please be sure medication list is accurate. If a new problem present, please set up appointment sooner than planned today.

## 2020-11-19 DIAGNOSIS — Z170 Estrogen receptor positive status [ER+]: Secondary | ICD-10-CM | POA: Diagnosis not present

## 2020-11-19 DIAGNOSIS — C50919 Malignant neoplasm of unspecified site of unspecified female breast: Secondary | ICD-10-CM | POA: Diagnosis not present

## 2020-11-20 DIAGNOSIS — Z45811 Encounter for adjustment or removal of right breast implant: Secondary | ICD-10-CM | POA: Diagnosis not present

## 2020-11-20 DIAGNOSIS — J45990 Exercise induced bronchospasm: Secondary | ICD-10-CM | POA: Diagnosis not present

## 2020-11-20 DIAGNOSIS — C50911 Malignant neoplasm of unspecified site of right female breast: Secondary | ICD-10-CM | POA: Diagnosis not present

## 2020-11-20 DIAGNOSIS — Z421 Encounter for breast reconstruction following mastectomy: Secondary | ICD-10-CM | POA: Diagnosis not present

## 2020-11-20 DIAGNOSIS — Z791 Long term (current) use of non-steroidal anti-inflammatories (NSAID): Secondary | ICD-10-CM | POA: Diagnosis not present

## 2020-11-20 DIAGNOSIS — Z9011 Acquired absence of right breast and nipple: Secondary | ICD-10-CM | POA: Diagnosis not present

## 2020-11-20 DIAGNOSIS — Z882 Allergy status to sulfonamides status: Secondary | ICD-10-CM | POA: Diagnosis not present

## 2020-11-20 DIAGNOSIS — C50919 Malignant neoplasm of unspecified site of unspecified female breast: Secondary | ICD-10-CM | POA: Diagnosis not present

## 2020-11-20 DIAGNOSIS — Z79899 Other long term (current) drug therapy: Secondary | ICD-10-CM | POA: Diagnosis not present

## 2020-11-20 DIAGNOSIS — Z170 Estrogen receptor positive status [ER+]: Secondary | ICD-10-CM | POA: Diagnosis not present

## 2020-11-20 DIAGNOSIS — Z853 Personal history of malignant neoplasm of breast: Secondary | ICD-10-CM | POA: Diagnosis not present

## 2020-11-20 DIAGNOSIS — N651 Disproportion of reconstructed breast: Secondary | ICD-10-CM | POA: Diagnosis not present

## 2020-11-25 DIAGNOSIS — M542 Cervicalgia: Secondary | ICD-10-CM | POA: Diagnosis not present

## 2020-11-30 DIAGNOSIS — Z170 Estrogen receptor positive status [ER+]: Secondary | ICD-10-CM | POA: Diagnosis not present

## 2020-11-30 DIAGNOSIS — M542 Cervicalgia: Secondary | ICD-10-CM | POA: Diagnosis not present

## 2020-11-30 DIAGNOSIS — C50919 Malignant neoplasm of unspecified site of unspecified female breast: Secondary | ICD-10-CM | POA: Diagnosis not present

## 2020-12-01 DIAGNOSIS — Z9011 Acquired absence of right breast and nipple: Secondary | ICD-10-CM | POA: Diagnosis not present

## 2020-12-01 DIAGNOSIS — Z170 Estrogen receptor positive status [ER+]: Secondary | ICD-10-CM | POA: Diagnosis not present

## 2020-12-01 DIAGNOSIS — N6489 Other specified disorders of breast: Secondary | ICD-10-CM | POA: Diagnosis not present

## 2020-12-01 DIAGNOSIS — F419 Anxiety disorder, unspecified: Secondary | ICD-10-CM | POA: Diagnosis not present

## 2020-12-01 DIAGNOSIS — C50011 Malignant neoplasm of nipple and areola, right female breast: Secondary | ICD-10-CM | POA: Diagnosis not present

## 2020-12-08 ENCOUNTER — Encounter: Payer: Self-pay | Admitting: Family Medicine

## 2020-12-08 DIAGNOSIS — Z170 Estrogen receptor positive status [ER+]: Secondary | ICD-10-CM | POA: Diagnosis not present

## 2020-12-08 DIAGNOSIS — C50919 Malignant neoplasm of unspecified site of unspecified female breast: Secondary | ICD-10-CM | POA: Diagnosis not present

## 2020-12-08 DIAGNOSIS — N6321 Unspecified lump in the left breast, upper outer quadrant: Secondary | ICD-10-CM | POA: Diagnosis not present

## 2020-12-15 DIAGNOSIS — H9201 Otalgia, right ear: Secondary | ICD-10-CM | POA: Diagnosis not present

## 2020-12-15 DIAGNOSIS — R200 Anesthesia of skin: Secondary | ICD-10-CM | POA: Diagnosis not present

## 2020-12-17 DIAGNOSIS — Z170 Estrogen receptor positive status [ER+]: Secondary | ICD-10-CM | POA: Diagnosis not present

## 2020-12-17 DIAGNOSIS — Z79899 Other long term (current) drug therapy: Secondary | ICD-10-CM | POA: Diagnosis not present

## 2020-12-17 DIAGNOSIS — Z9011 Acquired absence of right breast and nipple: Secondary | ICD-10-CM | POA: Diagnosis not present

## 2020-12-17 DIAGNOSIS — C50911 Malignant neoplasm of unspecified site of right female breast: Secondary | ICD-10-CM | POA: Diagnosis not present

## 2020-12-18 DIAGNOSIS — C50919 Malignant neoplasm of unspecified site of unspecified female breast: Secondary | ICD-10-CM | POA: Diagnosis not present

## 2020-12-18 DIAGNOSIS — Z170 Estrogen receptor positive status [ER+]: Secondary | ICD-10-CM | POA: Diagnosis not present

## 2020-12-18 DIAGNOSIS — R200 Anesthesia of skin: Secondary | ICD-10-CM | POA: Diagnosis not present

## 2021-01-12 DIAGNOSIS — C50011 Malignant neoplasm of nipple and areola, right female breast: Secondary | ICD-10-CM | POA: Diagnosis not present

## 2021-01-12 DIAGNOSIS — Z9011 Acquired absence of right breast and nipple: Secondary | ICD-10-CM | POA: Diagnosis not present

## 2021-01-12 DIAGNOSIS — Z170 Estrogen receptor positive status [ER+]: Secondary | ICD-10-CM | POA: Diagnosis not present

## 2021-01-14 DIAGNOSIS — R200 Anesthesia of skin: Secondary | ICD-10-CM | POA: Diagnosis not present

## 2021-01-14 DIAGNOSIS — C50311 Malignant neoplasm of lower-inner quadrant of right female breast: Secondary | ICD-10-CM | POA: Diagnosis not present

## 2021-01-14 DIAGNOSIS — C50919 Malignant neoplasm of unspecified site of unspecified female breast: Secondary | ICD-10-CM | POA: Diagnosis not present

## 2021-01-14 DIAGNOSIS — Z170 Estrogen receptor positive status [ER+]: Secondary | ICD-10-CM | POA: Diagnosis not present

## 2021-01-14 DIAGNOSIS — Z79811 Long term (current) use of aromatase inhibitors: Secondary | ICD-10-CM | POA: Diagnosis not present

## 2021-01-14 DIAGNOSIS — Z79899 Other long term (current) drug therapy: Secondary | ICD-10-CM | POA: Diagnosis not present

## 2021-02-09 MED FILL — LETROZOLE 2.5 MG TABS: 2.5 | 90 days supply | Qty: 90 | Fill #0

## 2021-02-11 DIAGNOSIS — C50919 Malignant neoplasm of unspecified site of unspecified female breast: Secondary | ICD-10-CM | POA: Diagnosis not present

## 2021-02-11 DIAGNOSIS — Z170 Estrogen receptor positive status [ER+]: Secondary | ICD-10-CM | POA: Diagnosis not present

## 2021-02-12 DIAGNOSIS — M545 Low back pain, unspecified: Secondary | ICD-10-CM | POA: Diagnosis not present

## 2021-02-12 DIAGNOSIS — M25551 Pain in right hip: Secondary | ICD-10-CM | POA: Diagnosis not present

## 2021-02-13 DIAGNOSIS — Z124 Encounter for screening for malignant neoplasm of cervix: Secondary | ICD-10-CM | POA: Diagnosis not present

## 2021-02-13 DIAGNOSIS — Z1151 Encounter for screening for human papillomavirus (HPV): Secondary | ICD-10-CM | POA: Diagnosis not present

## 2021-02-13 DIAGNOSIS — N898 Other specified noninflammatory disorders of vagina: Secondary | ICD-10-CM | POA: Diagnosis not present

## 2021-02-13 DIAGNOSIS — Z01419 Encounter for gynecological examination (general) (routine) without abnormal findings: Secondary | ICD-10-CM | POA: Diagnosis not present

## 2021-02-13 DIAGNOSIS — Z1389 Encounter for screening for other disorder: Secondary | ICD-10-CM | POA: Diagnosis not present

## 2021-02-13 DIAGNOSIS — Z681 Body mass index (BMI) 19 or less, adult: Secondary | ICD-10-CM | POA: Diagnosis not present

## 2021-02-16 DIAGNOSIS — M545 Low back pain, unspecified: Secondary | ICD-10-CM | POA: Diagnosis not present

## 2021-02-16 DIAGNOSIS — H5213 Myopia, bilateral: Secondary | ICD-10-CM | POA: Diagnosis not present

## 2021-02-19 DIAGNOSIS — M545 Low back pain, unspecified: Secondary | ICD-10-CM | POA: Diagnosis not present

## 2021-02-19 LAB — HM PAP SMEAR: HPV, high-risk: NEGATIVE

## 2021-03-11 DIAGNOSIS — C50919 Malignant neoplasm of unspecified site of unspecified female breast: Secondary | ICD-10-CM | POA: Diagnosis not present

## 2021-03-11 DIAGNOSIS — Z170 Estrogen receptor positive status [ER+]: Secondary | ICD-10-CM | POA: Diagnosis not present

## 2021-03-17 ENCOUNTER — Other Ambulatory Visit (HOSPITAL_COMMUNITY): Payer: Self-pay

## 2021-03-17 MED ORDER — ESCITALOPRAM OXALATE 10 MG PO TABS
10.0000 mg | ORAL_TABLET | Freq: Every day | ORAL | 0 refills | Status: DC
Start: 2020-10-21 — End: 2021-05-12
  Filled 2021-03-17: qty 30, 30d supply, fill #0

## 2021-03-18 ENCOUNTER — Other Ambulatory Visit (HOSPITAL_COMMUNITY): Payer: Self-pay

## 2021-03-24 DIAGNOSIS — G509 Disorder of trigeminal nerve, unspecified: Secondary | ICD-10-CM | POA: Diagnosis not present

## 2021-03-31 DIAGNOSIS — E2839 Other primary ovarian failure: Secondary | ICD-10-CM | POA: Diagnosis not present

## 2021-03-31 DIAGNOSIS — C50919 Malignant neoplasm of unspecified site of unspecified female breast: Secondary | ICD-10-CM | POA: Diagnosis not present

## 2021-03-31 DIAGNOSIS — Z170 Estrogen receptor positive status [ER+]: Secondary | ICD-10-CM | POA: Diagnosis not present

## 2021-03-31 DIAGNOSIS — Z79811 Long term (current) use of aromatase inhibitors: Secondary | ICD-10-CM | POA: Diagnosis not present

## 2021-04-01 DIAGNOSIS — G44009 Cluster headache syndrome, unspecified, not intractable: Secondary | ICD-10-CM | POA: Diagnosis not present

## 2021-04-01 DIAGNOSIS — G509 Disorder of trigeminal nerve, unspecified: Secondary | ICD-10-CM | POA: Diagnosis not present

## 2021-04-08 DIAGNOSIS — C50919 Malignant neoplasm of unspecified site of unspecified female breast: Secondary | ICD-10-CM | POA: Diagnosis not present

## 2021-04-08 DIAGNOSIS — Z170 Estrogen receptor positive status [ER+]: Secondary | ICD-10-CM | POA: Diagnosis not present

## 2021-04-13 DIAGNOSIS — N6489 Other specified disorders of breast: Secondary | ICD-10-CM | POA: Diagnosis not present

## 2021-04-13 DIAGNOSIS — Z9011 Acquired absence of right breast and nipple: Secondary | ICD-10-CM | POA: Diagnosis not present

## 2021-04-13 DIAGNOSIS — Z170 Estrogen receptor positive status [ER+]: Secondary | ICD-10-CM | POA: Diagnosis not present

## 2021-04-13 DIAGNOSIS — C50011 Malignant neoplasm of nipple and areola, right female breast: Secondary | ICD-10-CM | POA: Diagnosis not present

## 2021-04-20 ENCOUNTER — Encounter: Payer: Self-pay | Admitting: Family Medicine

## 2021-04-24 DIAGNOSIS — Z20822 Contact with and (suspected) exposure to covid-19: Secondary | ICD-10-CM | POA: Diagnosis not present

## 2021-05-06 DIAGNOSIS — C50919 Malignant neoplasm of unspecified site of unspecified female breast: Secondary | ICD-10-CM | POA: Diagnosis not present

## 2021-05-06 DIAGNOSIS — Z170 Estrogen receptor positive status [ER+]: Secondary | ICD-10-CM | POA: Diagnosis not present

## 2021-05-11 ENCOUNTER — Encounter: Payer: Self-pay | Admitting: Family Medicine

## 2021-05-12 ENCOUNTER — Other Ambulatory Visit (HOSPITAL_BASED_OUTPATIENT_CLINIC_OR_DEPARTMENT_OTHER): Payer: Self-pay

## 2021-05-12 DIAGNOSIS — Z20822 Contact with and (suspected) exposure to covid-19: Secondary | ICD-10-CM | POA: Diagnosis not present

## 2021-05-12 MED ORDER — ESCITALOPRAM OXALATE 10 MG PO TABS
10.0000 mg | ORAL_TABLET | Freq: Every day | ORAL | 0 refills | Status: DC
  Filled 2021-05-12: qty 30, 30d supply, fill #0

## 2021-05-13 ENCOUNTER — Other Ambulatory Visit (HOSPITAL_COMMUNITY): Payer: Self-pay

## 2021-05-13 MED ORDER — LORAZEPAM 0.5 MG PO TABS
0.5000 mg | ORAL_TABLET | Freq: Two times a day (BID) | ORAL | 0 refills | Status: DC | PRN
  Filled 2021-05-13: qty 30, 15d supply, fill #0

## 2021-05-13 MED FILL — Letrozole Tab 2.5 MG: ORAL | 90 days supply | Qty: 90 | Fill #0 | Status: CN

## 2021-05-14 ENCOUNTER — Other Ambulatory Visit (HOSPITAL_COMMUNITY): Payer: Self-pay

## 2021-05-14 ENCOUNTER — Other Ambulatory Visit (HOSPITAL_BASED_OUTPATIENT_CLINIC_OR_DEPARTMENT_OTHER): Payer: Self-pay

## 2021-05-14 MED ORDER — COVID-19 AT HOME ANTIGEN TEST VI KIT
PACK | 0 refills | Status: DC
  Filled 2021-05-14: qty 4, 8d supply, fill #0

## 2021-05-14 MED FILL — Letrozole Tab 2.5 MG: ORAL | 90 days supply | Qty: 90 | Fill #0 | Status: AC

## 2021-05-17 IMAGING — US US  BREAST BX W/ LOC DEV 1ST LESION IMG BX SPEC US GUIDE*R*
1 series · 9 of 9 positions shown · non-contrast
Comparison: Previous exam(s).
COMPARISON: Previous exam(s).

Addendum:
CLINICAL DATA: Palpable mass in the right breast. Patient has a
history of silicone implant augmentation.

EXAM:
ULTRASOUND GUIDED RIGHT BREAST CORE NEEDLE BIOPSY

[Series 1: us breast bx w/ loc dev 1st lesion img bx spec us  · 0.03mm/px · 9 of 9 slices shown]
[im 1/9]
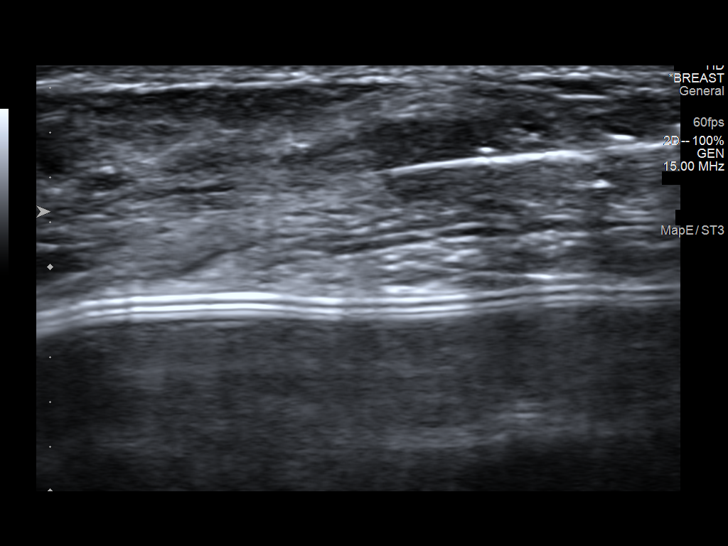
[im 2/9]
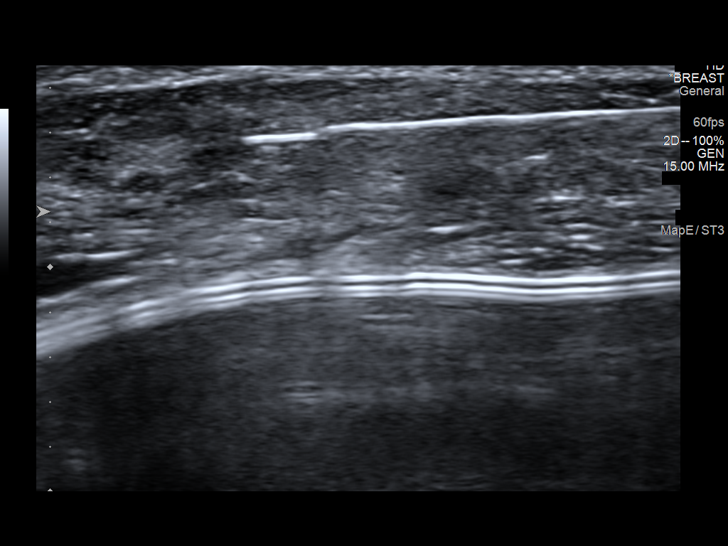
[im 3/9]
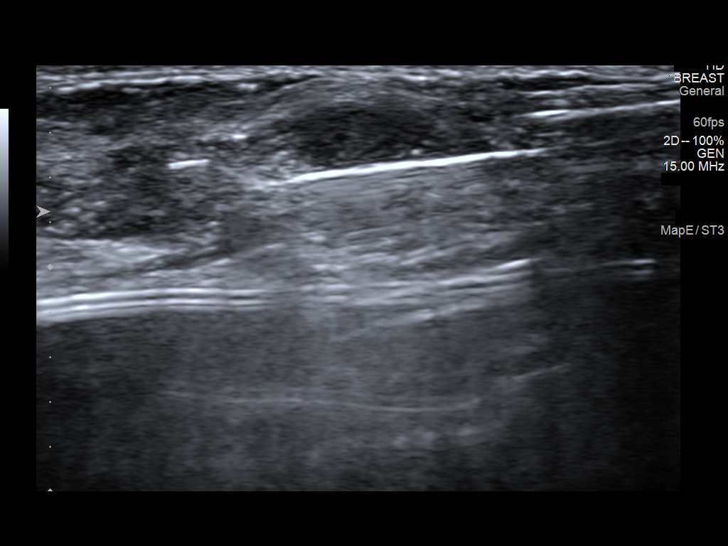
[im 4/9]
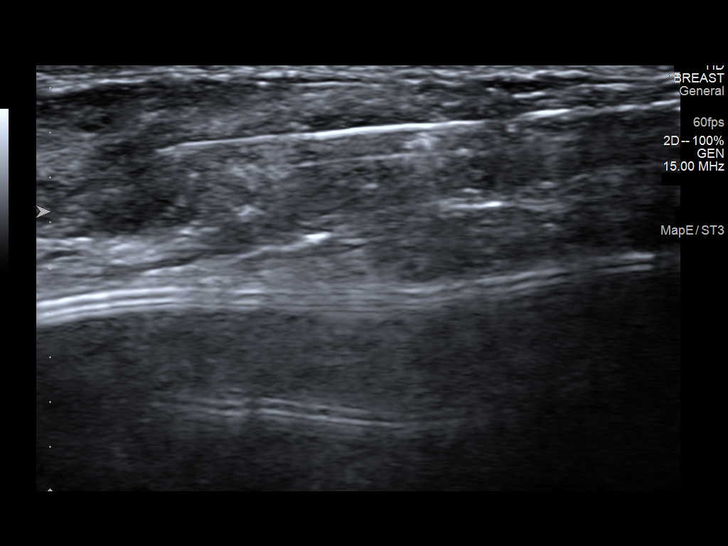
[im 5/9]
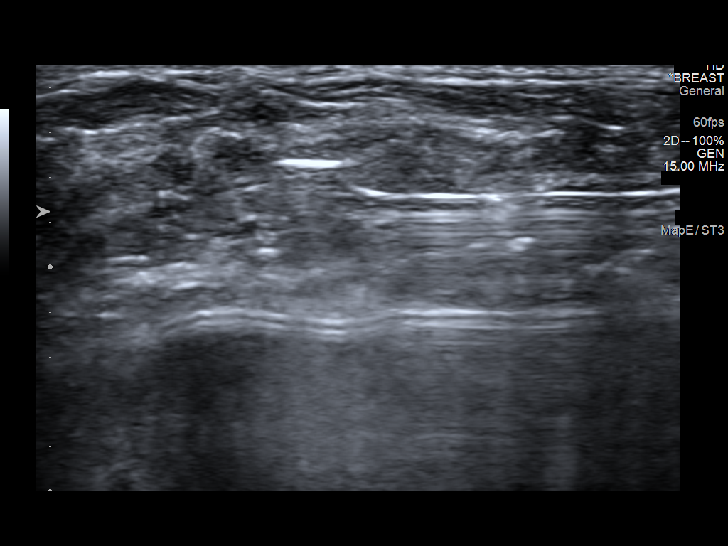
[im 6/9]
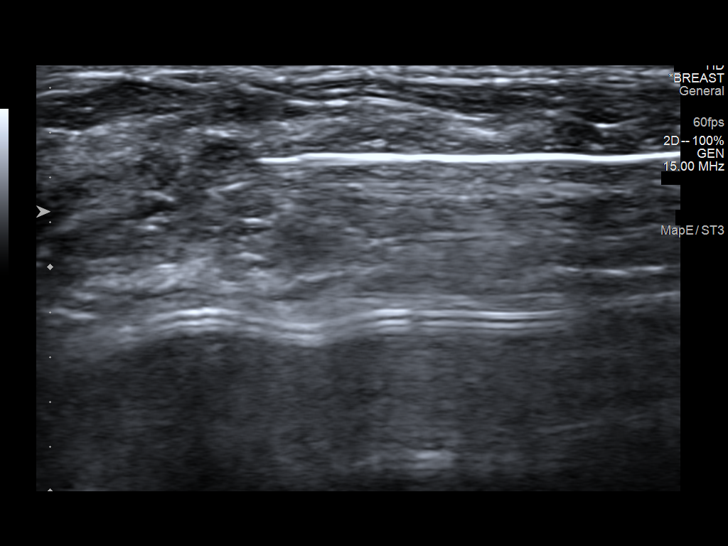
[im 7/9]
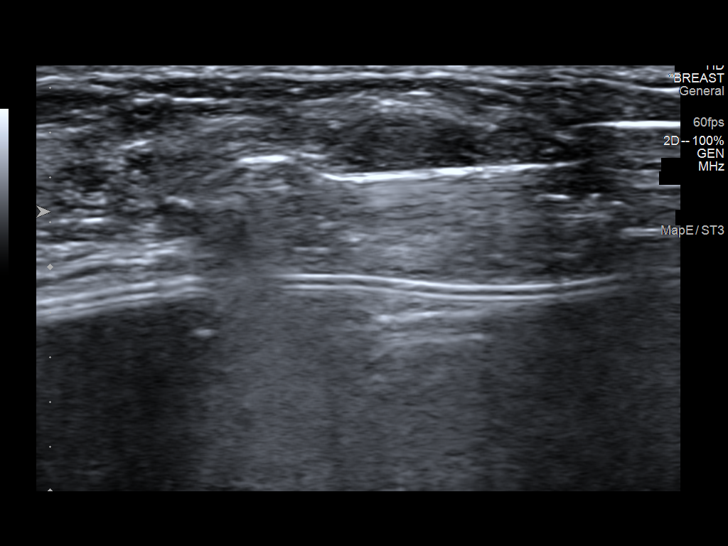
[im 8/9]
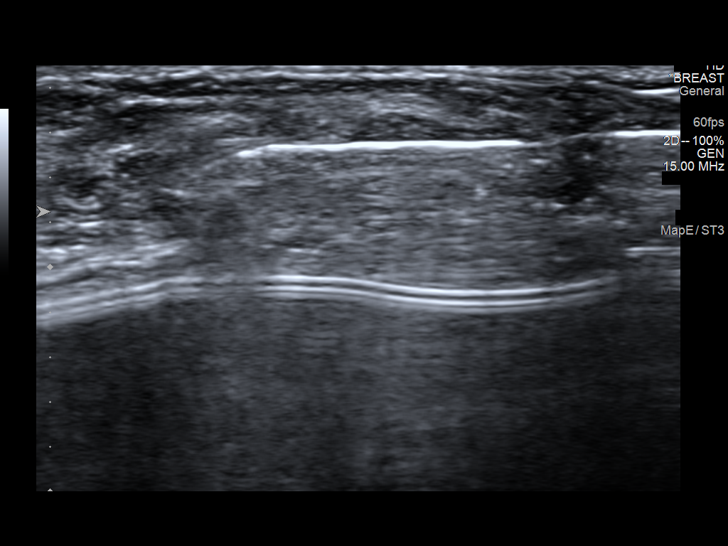
[im 9/9]
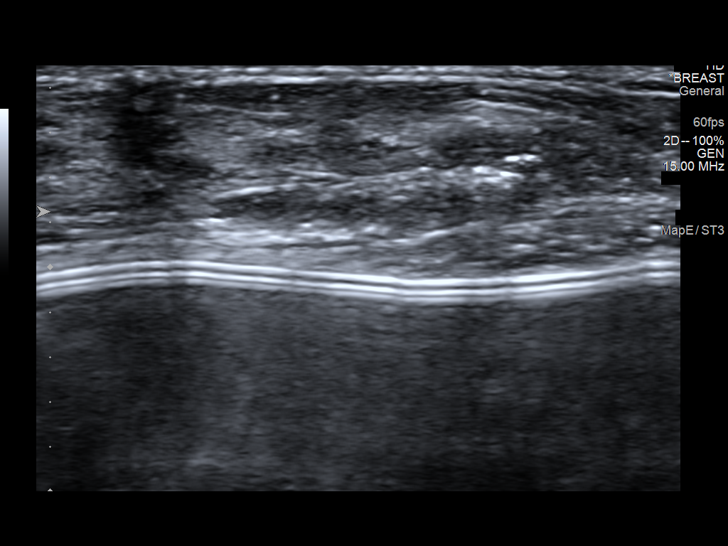

[9 of 9 positions shown; findings below may reference images not displayed]

FINDINGS: I met with the patient and we discussed the procedure of
ultrasound-guided biopsy, including benefits and alternatives. We
discussed the high likelihood of a successful procedure. We
discussed the risks of the procedure, including infection, bleeding,
tissue injury, clip migration, inadequate sampling, and implant
rupture. Informed written consent was given. The usual time-out
protocol was performed immediately prior to the procedure.

Lesion quadrant: Lower inner quadrant

Using sterile technique and 1% Lidocaine as local anesthetic, under
direct ultrasound visualization, a 14 gauge Topsky device was
used to perform biopsy of a hypoechoic mass at 4 o'clock 3 cm from
the nipple using a medial approach. At the conclusion of the
procedure a heart shaped tissue marker clip was deployed into the
biopsy cavity. Follow up mammogram was performed and dictated
separately.
IMPRESSION: Ultrasound guided biopsy of a hypoechoic mass in the right breast.
No apparent complications.

ADDENDUM:
Pathology revealed GRADE III INVASIVE DUCTAL CARCINOMA of the Right
breast, 4 o'clock, 1cmfn. This was found to be concordant by Dr.
Ararat Hovsepyan.

Pathology results were discussed with the patient by telephone by
Dr. Ararat Hovsepyan. The patient reported doing well after the biopsy
with tenderness at the site. Post biopsy instructions and care were
reviewed and questions were answered. The patient was encouraged to
call The [REDACTED] for any additional
concerns.

The patient was referred to [REDACTED]
[REDACTED] at [REDACTED] on
September 26, 2019.

Pathology results reported by Fokk Bery, RN on 09/18/2019.

*** End of Addendum ***
FINDINGS: I met with the patient and we discussed the procedure of
ultrasound-guided biopsy, including benefits and alternatives. We
discussed the high likelihood of a successful procedure. We
discussed the risks of the procedure, including infection, bleeding,
tissue injury, clip migration, inadequate sampling, and implant
rupture. Informed written consent was given. The usual time-out
protocol was performed immediately prior to the procedure.

Lesion quadrant: Lower inner quadrant

Using sterile technique and 1% Lidocaine as local anesthetic, under
direct ultrasound visualization, a 14 gauge Topsky device was
used to perform biopsy of a hypoechoic mass at 4 o'clock 3 cm from
the nipple using a medial approach. At the conclusion of the
procedure a heart shaped tissue marker clip was deployed into the
biopsy cavity. Follow up mammogram was performed and dictated
separately.
IMPRESSION: Ultrasound guided biopsy of a hypoechoic mass in the right breast.
No apparent complications.

## 2021-05-17 IMAGING — MG MM BREAST LOCALIZATION CLIP
3 series · 3 of 7 positions shown · non-contrast
Comparison: Previous exam(s).

CLINICAL DATA: Palpable hypoechoic mass in the right breast status
post ultrasound-guided biopsy

EXAM:
DIAGNOSTIC RIGHT MAMMOGRAM POST ULTRASOUND BIOPSY

[R CC]
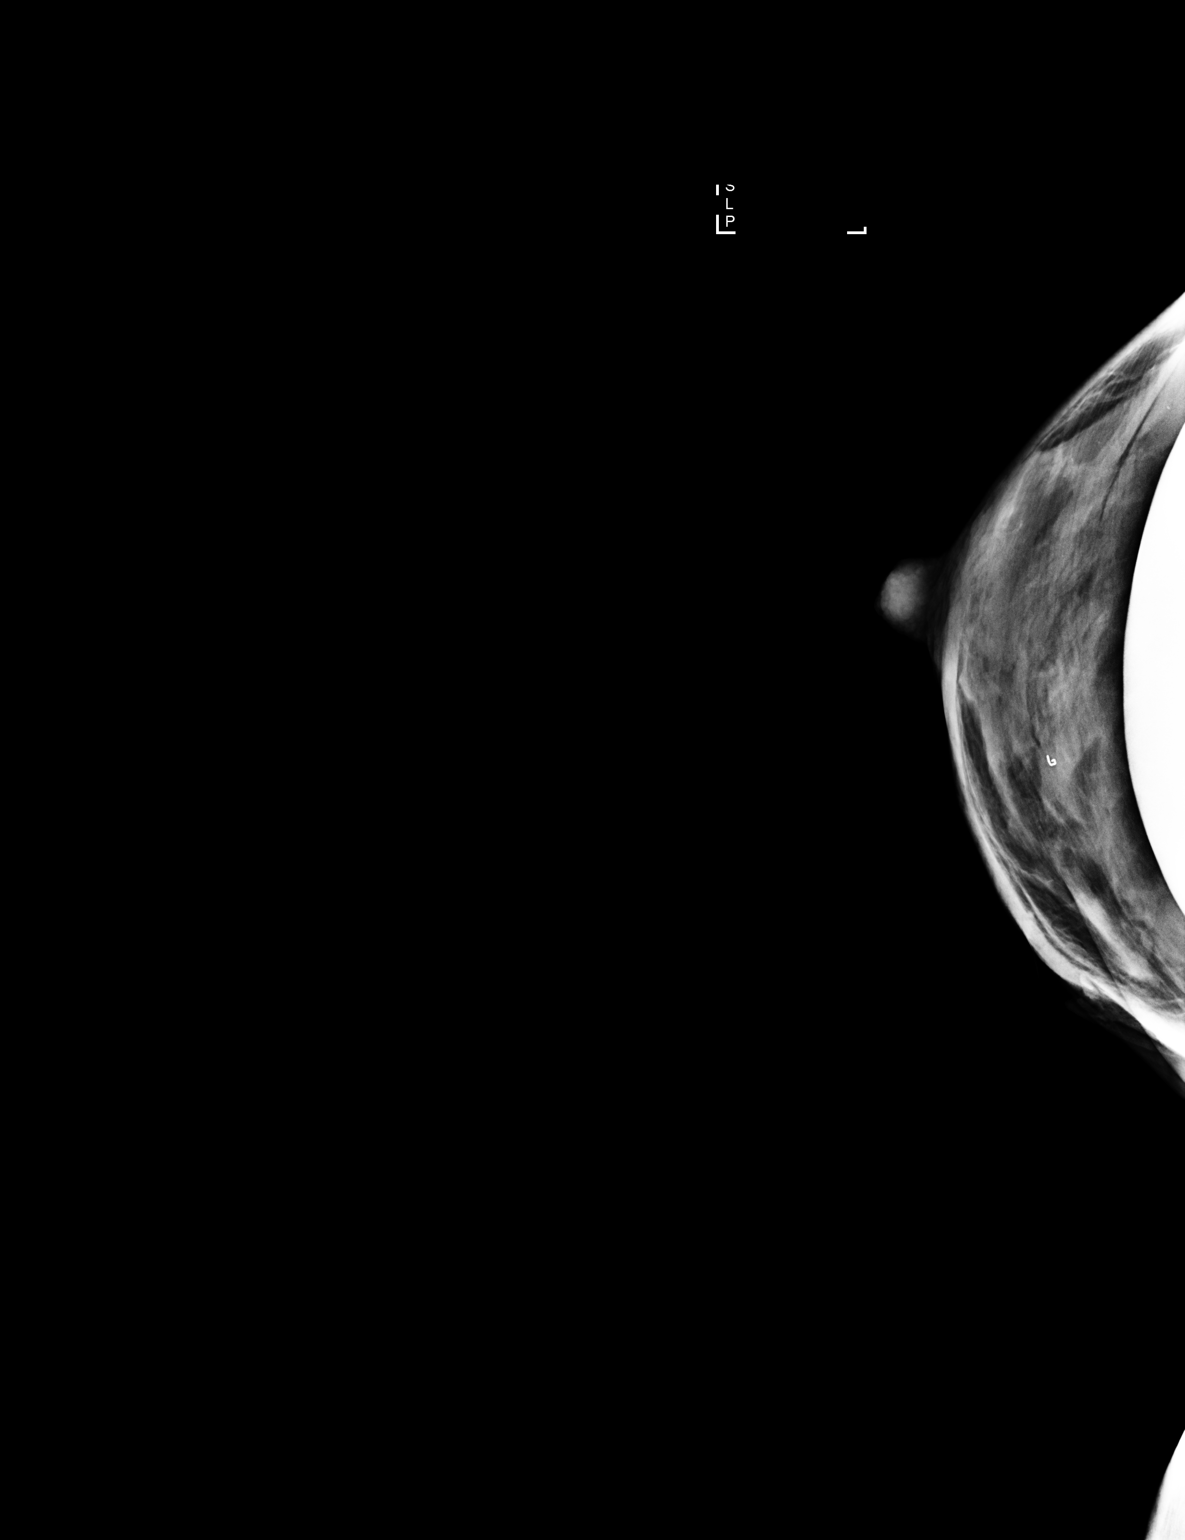

[R CC synth-2D]
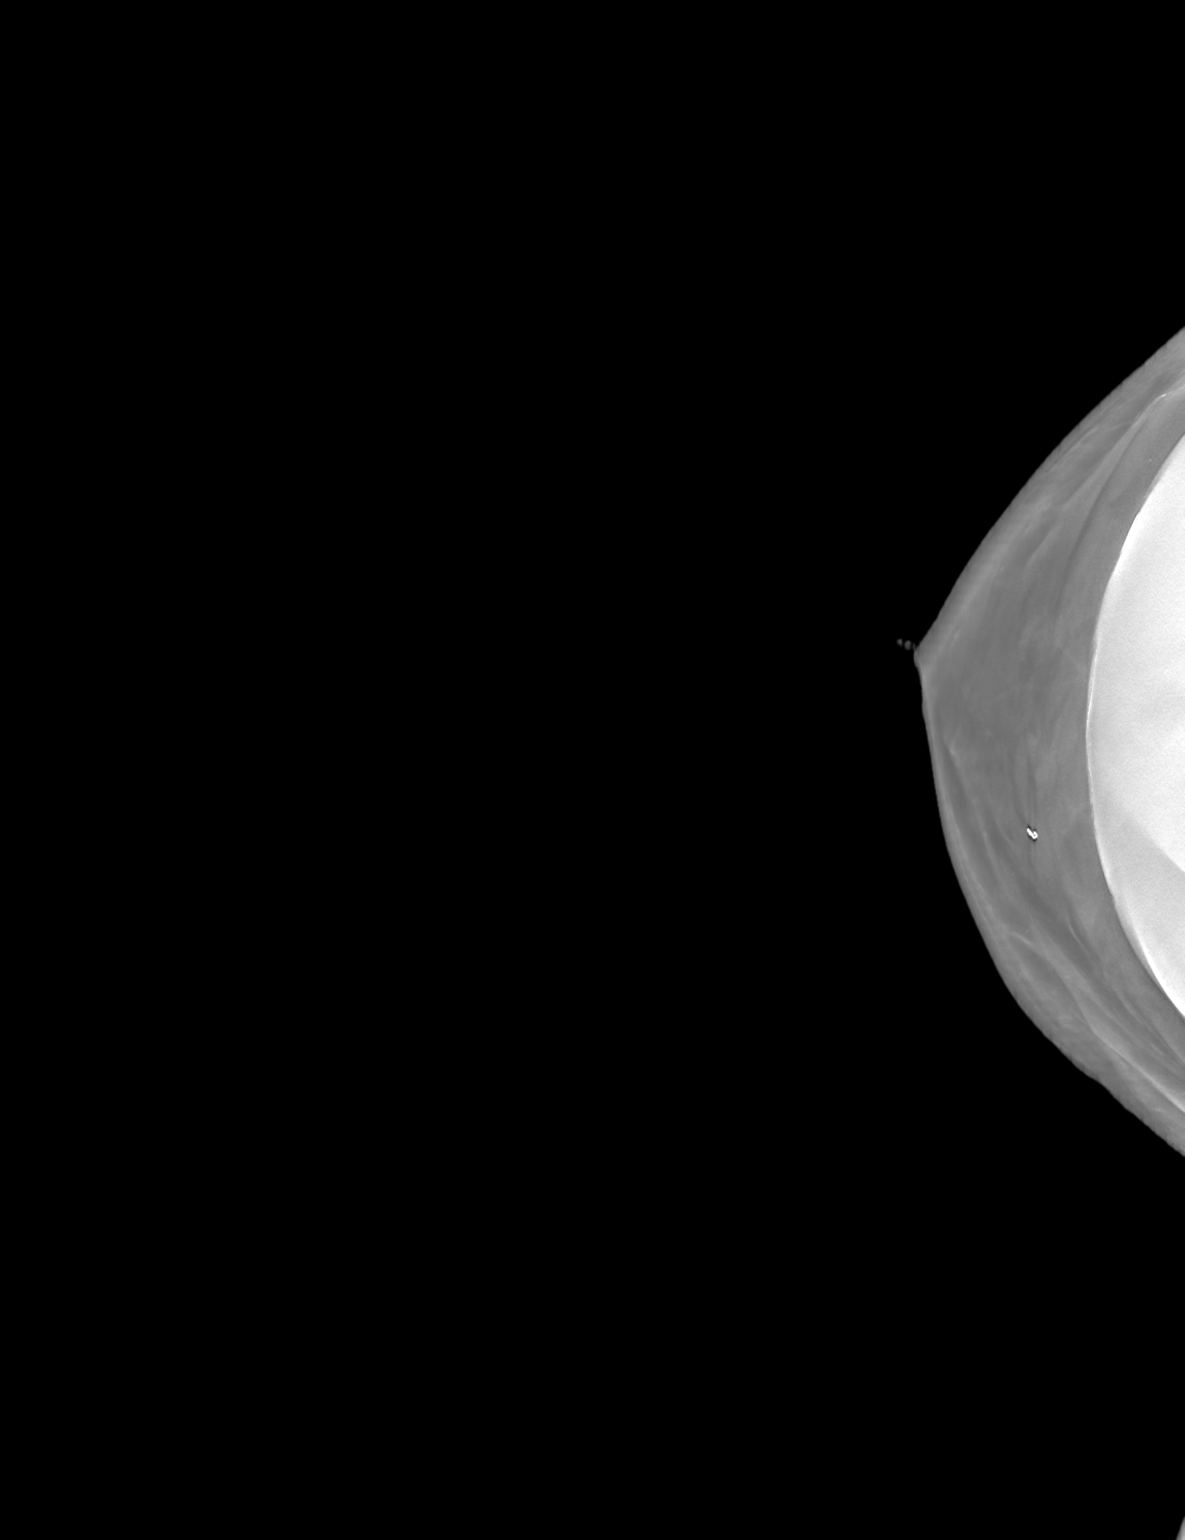

[R CCID BREAST TOMOSYNTHESIS IMAGE tomo · tomo slice 29/56.0]
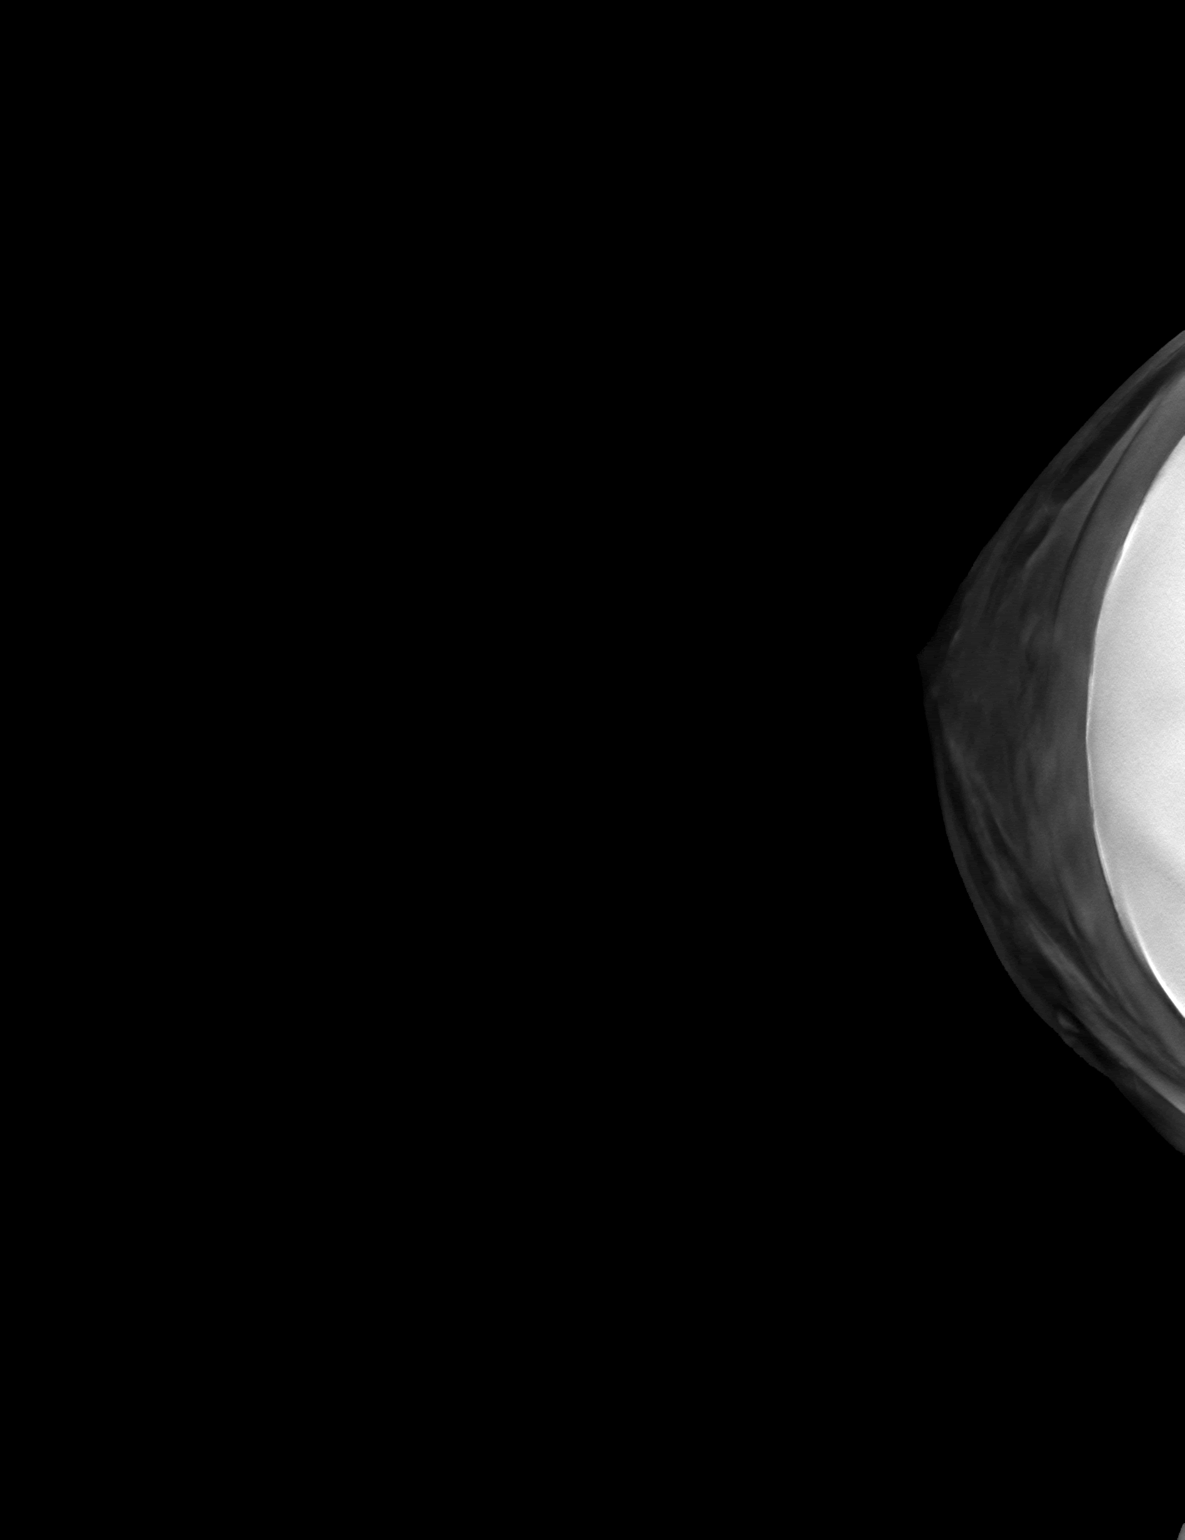

[3 of 7 positions shown; findings below may reference images not displayed]

FINDINGS: Mammographic images were obtained following ultrasound guided biopsy
of a hypoechoic mass in the right breast at 4 o'clock 3 cm from the
nipple. A heart shaped biopsy marking clip is appropriately
positioned at the site of biopsy. The patient felt lightheaded while
the first mammogram view was obtained and given the appropriate
positioning of the biopsy marking clip on the tomosynthesis images
and on the ultrasound during the procedure, a second orthogonal view
was deferred.
IMPRESSION: Heart shaped biopsy marking clip appropriately positioned at the
site of biopsy in the right breast.

Final Assessment: Post Procedure Mammograms for Marker Placement

## 2021-05-27 DIAGNOSIS — M62838 Other muscle spasm: Secondary | ICD-10-CM | POA: Diagnosis not present

## 2021-05-27 DIAGNOSIS — M50121 Cervical disc disorder at C4-C5 level with radiculopathy: Secondary | ICD-10-CM | POA: Diagnosis not present

## 2021-05-27 DIAGNOSIS — M6281 Muscle weakness (generalized): Secondary | ICD-10-CM | POA: Diagnosis not present

## 2021-06-02 DIAGNOSIS — M6281 Muscle weakness (generalized): Secondary | ICD-10-CM | POA: Diagnosis not present

## 2021-06-02 DIAGNOSIS — M62838 Other muscle spasm: Secondary | ICD-10-CM | POA: Diagnosis not present

## 2021-06-02 DIAGNOSIS — M50121 Cervical disc disorder at C4-C5 level with radiculopathy: Secondary | ICD-10-CM | POA: Diagnosis not present

## 2021-06-03 DIAGNOSIS — Z170 Estrogen receptor positive status [ER+]: Secondary | ICD-10-CM | POA: Diagnosis not present

## 2021-06-03 DIAGNOSIS — C50919 Malignant neoplasm of unspecified site of unspecified female breast: Secondary | ICD-10-CM | POA: Diagnosis not present

## 2021-06-05 DIAGNOSIS — M62838 Other muscle spasm: Secondary | ICD-10-CM | POA: Diagnosis not present

## 2021-06-05 DIAGNOSIS — M6281 Muscle weakness (generalized): Secondary | ICD-10-CM | POA: Diagnosis not present

## 2021-06-05 DIAGNOSIS — M50121 Cervical disc disorder at C4-C5 level with radiculopathy: Secondary | ICD-10-CM | POA: Diagnosis not present

## 2021-06-08 DIAGNOSIS — M6281 Muscle weakness (generalized): Secondary | ICD-10-CM | POA: Diagnosis not present

## 2021-06-08 DIAGNOSIS — M50121 Cervical disc disorder at C4-C5 level with radiculopathy: Secondary | ICD-10-CM | POA: Diagnosis not present

## 2021-06-08 DIAGNOSIS — M62838 Other muscle spasm: Secondary | ICD-10-CM | POA: Diagnosis not present

## 2021-06-12 DIAGNOSIS — M50121 Cervical disc disorder at C4-C5 level with radiculopathy: Secondary | ICD-10-CM | POA: Diagnosis not present

## 2021-06-12 DIAGNOSIS — M62838 Other muscle spasm: Secondary | ICD-10-CM | POA: Diagnosis not present

## 2021-06-12 DIAGNOSIS — M6281 Muscle weakness (generalized): Secondary | ICD-10-CM | POA: Diagnosis not present

## 2021-06-16 DIAGNOSIS — M6281 Muscle weakness (generalized): Secondary | ICD-10-CM | POA: Diagnosis not present

## 2021-06-16 DIAGNOSIS — M62838 Other muscle spasm: Secondary | ICD-10-CM | POA: Diagnosis not present

## 2021-06-16 DIAGNOSIS — M50121 Cervical disc disorder at C4-C5 level with radiculopathy: Secondary | ICD-10-CM | POA: Diagnosis not present

## 2021-06-17 IMAGING — MG MM BREAST SURGICAL SPECIMEN
2 series · 2 of 2 positions shown · non-contrast
Comparison: Previous exam(s).

CLINICAL DATA: Patient status post right lumpectomy.

EXAM:
SPECIMEN RADIOGRAPH OF THE RIGHT BREAST

[R (1 of 2)]
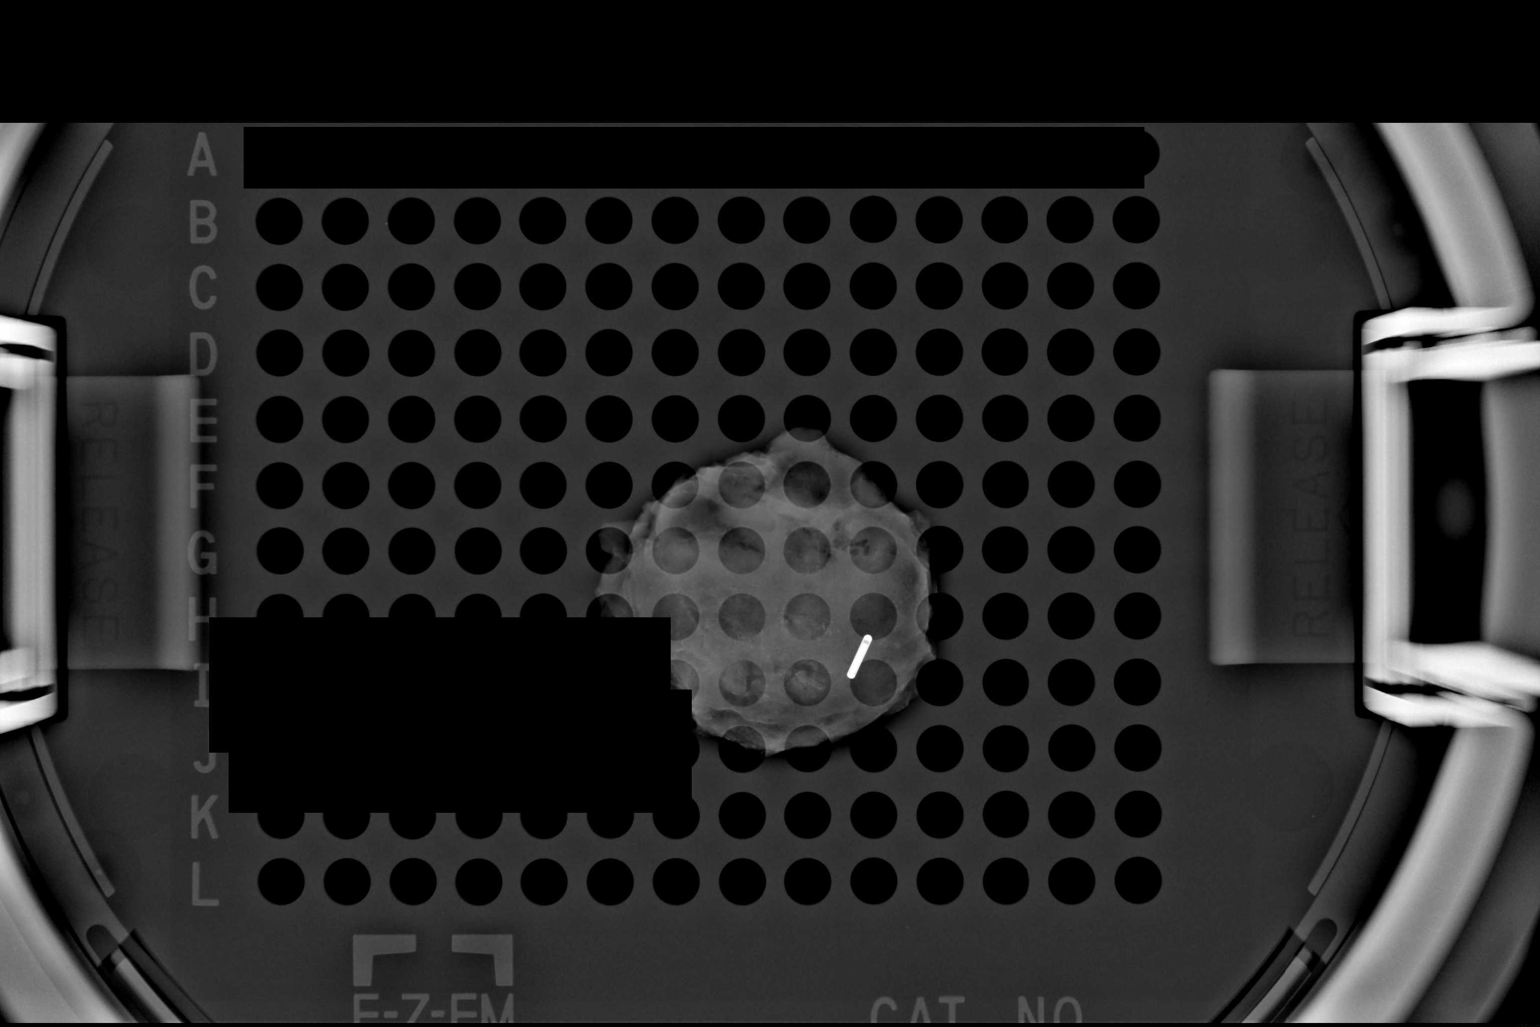

[R (2 of 2)]
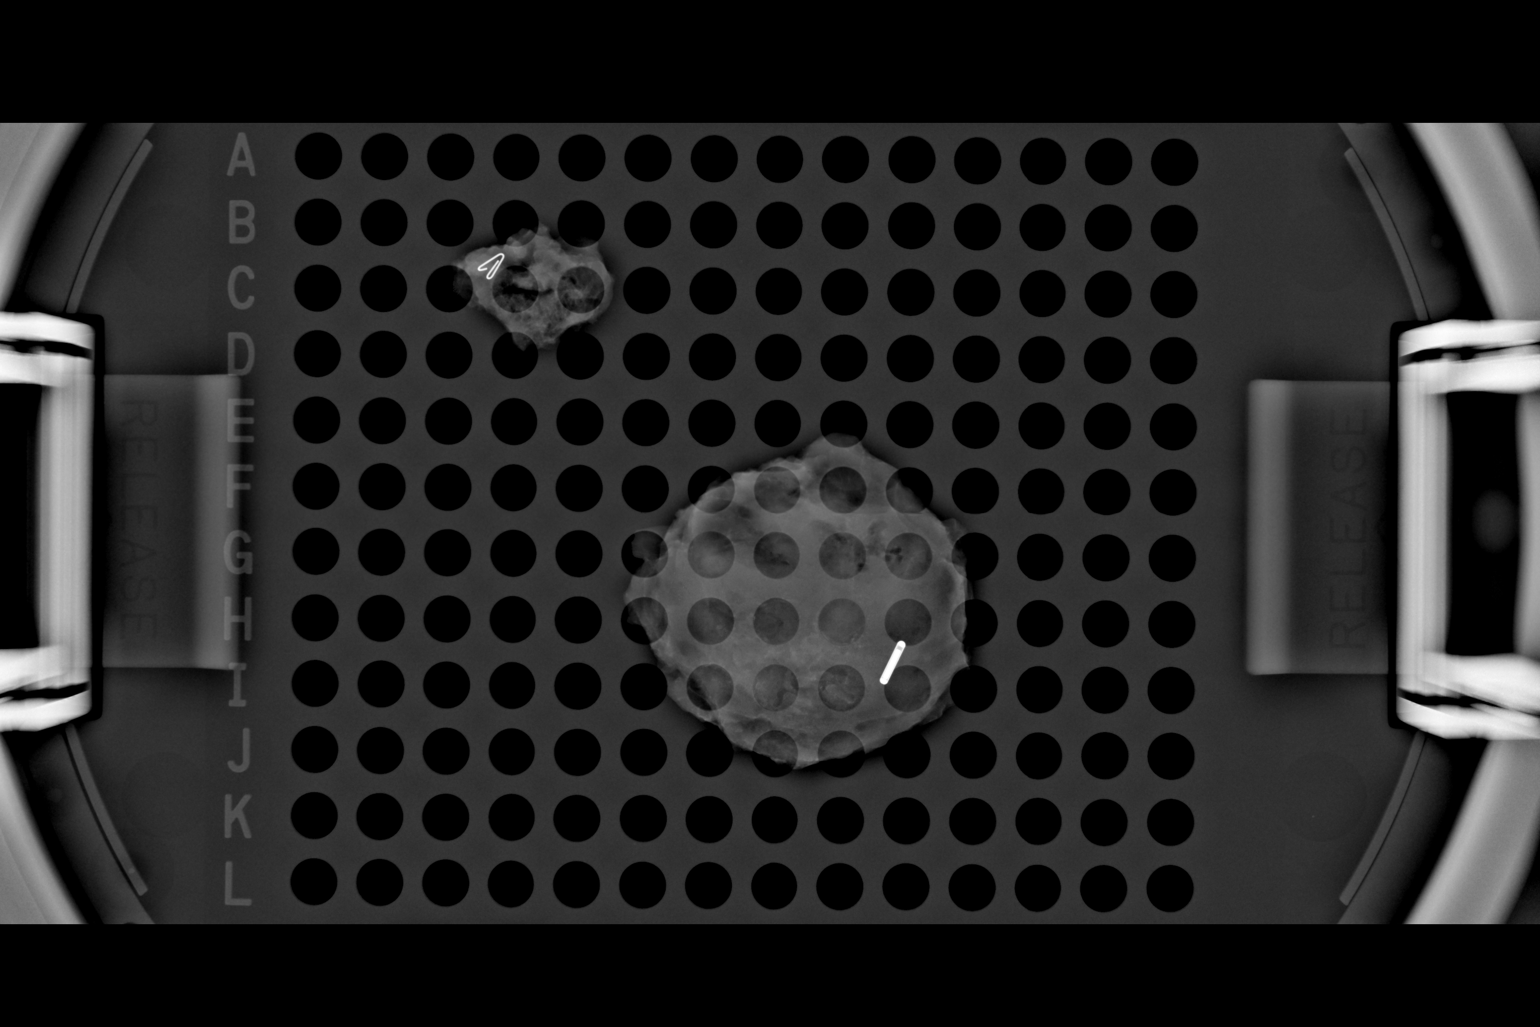

[2 of 2 positions shown; findings below may reference images not displayed]

FINDINGS: Status post excision of the right breast. The radioactive seed and
biopsy marker clip are present, completely intact, and were marked
for pathology.
IMPRESSION: Specimen radiograph of the right breast.

## 2021-06-22 DIAGNOSIS — M62838 Other muscle spasm: Secondary | ICD-10-CM | POA: Diagnosis not present

## 2021-06-22 DIAGNOSIS — M6281 Muscle weakness (generalized): Secondary | ICD-10-CM | POA: Diagnosis not present

## 2021-06-22 DIAGNOSIS — M50121 Cervical disc disorder at C4-C5 level with radiculopathy: Secondary | ICD-10-CM | POA: Diagnosis not present

## 2021-06-23 DIAGNOSIS — Z1239 Encounter for other screening for malignant neoplasm of breast: Secondary | ICD-10-CM | POA: Diagnosis not present

## 2021-06-23 DIAGNOSIS — Z170 Estrogen receptor positive status [ER+]: Secondary | ICD-10-CM | POA: Diagnosis not present

## 2021-06-23 DIAGNOSIS — C50811 Malignant neoplasm of overlapping sites of right female breast: Secondary | ICD-10-CM | POA: Diagnosis not present

## 2021-06-23 DIAGNOSIS — Z853 Personal history of malignant neoplasm of breast: Secondary | ICD-10-CM | POA: Diagnosis not present

## 2021-06-24 DIAGNOSIS — M6281 Muscle weakness (generalized): Secondary | ICD-10-CM | POA: Diagnosis not present

## 2021-06-24 DIAGNOSIS — M50121 Cervical disc disorder at C4-C5 level with radiculopathy: Secondary | ICD-10-CM | POA: Diagnosis not present

## 2021-06-24 DIAGNOSIS — M62838 Other muscle spasm: Secondary | ICD-10-CM | POA: Diagnosis not present

## 2021-06-29 DIAGNOSIS — M62838 Other muscle spasm: Secondary | ICD-10-CM | POA: Diagnosis not present

## 2021-06-29 DIAGNOSIS — M50121 Cervical disc disorder at C4-C5 level with radiculopathy: Secondary | ICD-10-CM | POA: Diagnosis not present

## 2021-06-29 DIAGNOSIS — M6281 Muscle weakness (generalized): Secondary | ICD-10-CM | POA: Diagnosis not present

## 2021-07-01 DIAGNOSIS — Z79899 Other long term (current) drug therapy: Secondary | ICD-10-CM | POA: Diagnosis not present

## 2021-07-01 DIAGNOSIS — N951 Menopausal and female climacteric states: Secondary | ICD-10-CM | POA: Diagnosis not present

## 2021-07-01 DIAGNOSIS — C50111 Malignant neoplasm of central portion of right female breast: Secondary | ICD-10-CM | POA: Diagnosis not present

## 2021-07-01 DIAGNOSIS — Z170 Estrogen receptor positive status [ER+]: Secondary | ICD-10-CM | POA: Diagnosis not present

## 2021-07-01 DIAGNOSIS — C50919 Malignant neoplasm of unspecified site of unspecified female breast: Secondary | ICD-10-CM | POA: Diagnosis not present

## 2021-07-07 DIAGNOSIS — M62838 Other muscle spasm: Secondary | ICD-10-CM | POA: Diagnosis not present

## 2021-07-07 DIAGNOSIS — M50121 Cervical disc disorder at C4-C5 level with radiculopathy: Secondary | ICD-10-CM | POA: Diagnosis not present

## 2021-07-07 DIAGNOSIS — M6281 Muscle weakness (generalized): Secondary | ICD-10-CM | POA: Diagnosis not present

## 2021-07-10 DIAGNOSIS — M62838 Other muscle spasm: Secondary | ICD-10-CM | POA: Diagnosis not present

## 2021-07-10 DIAGNOSIS — M50121 Cervical disc disorder at C4-C5 level with radiculopathy: Secondary | ICD-10-CM | POA: Diagnosis not present

## 2021-07-10 DIAGNOSIS — M6281 Muscle weakness (generalized): Secondary | ICD-10-CM | POA: Diagnosis not present

## 2021-07-13 ENCOUNTER — Other Ambulatory Visit: Payer: Self-pay | Admitting: Family Medicine

## 2021-07-13 ENCOUNTER — Other Ambulatory Visit (HOSPITAL_BASED_OUTPATIENT_CLINIC_OR_DEPARTMENT_OTHER): Payer: Self-pay

## 2021-07-13 MED ORDER — ESCITALOPRAM OXALATE 10 MG PO TABS
10.0000 mg | ORAL_TABLET | Freq: Every day | ORAL | 0 refills | Status: DC
  Filled 2021-07-13: qty 30, 30d supply, fill #0

## 2021-07-17 DIAGNOSIS — M6281 Muscle weakness (generalized): Secondary | ICD-10-CM | POA: Diagnosis not present

## 2021-07-17 DIAGNOSIS — M50121 Cervical disc disorder at C4-C5 level with radiculopathy: Secondary | ICD-10-CM | POA: Diagnosis not present

## 2021-07-17 DIAGNOSIS — M62838 Other muscle spasm: Secondary | ICD-10-CM | POA: Diagnosis not present

## 2021-07-21 DIAGNOSIS — C50919 Malignant neoplasm of unspecified site of unspecified female breast: Secondary | ICD-10-CM | POA: Diagnosis not present

## 2021-07-21 DIAGNOSIS — Z170 Estrogen receptor positive status [ER+]: Secondary | ICD-10-CM | POA: Diagnosis not present

## 2021-07-24 DIAGNOSIS — M62838 Other muscle spasm: Secondary | ICD-10-CM | POA: Diagnosis not present

## 2021-07-24 DIAGNOSIS — M6281 Muscle weakness (generalized): Secondary | ICD-10-CM | POA: Diagnosis not present

## 2021-07-24 DIAGNOSIS — M50121 Cervical disc disorder at C4-C5 level with radiculopathy: Secondary | ICD-10-CM | POA: Diagnosis not present

## 2021-07-28 DIAGNOSIS — M62838 Other muscle spasm: Secondary | ICD-10-CM | POA: Diagnosis not present

## 2021-07-28 DIAGNOSIS — M6281 Muscle weakness (generalized): Secondary | ICD-10-CM | POA: Diagnosis not present

## 2021-07-28 DIAGNOSIS — M50121 Cervical disc disorder at C4-C5 level with radiculopathy: Secondary | ICD-10-CM | POA: Diagnosis not present

## 2021-07-29 DIAGNOSIS — Z170 Estrogen receptor positive status [ER+]: Secondary | ICD-10-CM | POA: Diagnosis not present

## 2021-07-29 DIAGNOSIS — C50919 Malignant neoplasm of unspecified site of unspecified female breast: Secondary | ICD-10-CM | POA: Diagnosis not present

## 2021-08-06 DIAGNOSIS — U071 COVID-19: Secondary | ICD-10-CM | POA: Diagnosis not present

## 2021-08-07 ENCOUNTER — Other Ambulatory Visit (HOSPITAL_BASED_OUTPATIENT_CLINIC_OR_DEPARTMENT_OTHER): Payer: Self-pay

## 2021-08-07 ENCOUNTER — Telehealth (HOSPITAL_BASED_OUTPATIENT_CLINIC_OR_DEPARTMENT_OTHER): Payer: Self-pay | Admitting: Pharmacist

## 2021-08-07 MED ORDER — COVID-19 AT HOME ANTIGEN TEST VI KIT
PACK | 0 refills | Status: DC
  Filled 2021-08-07: qty 2, 4d supply, fill #0

## 2021-08-07 MED ORDER — PAXLOVID (300/100) 20 X 150 MG & 10 X 100MG PO TBPK
ORAL_TABLET | ORAL | 0 refills | Status: DC
  Filled 2021-08-07: qty 30, 5d supply, fill #0

## 2021-08-07 NOTE — Telephone Encounter (Signed)
Outpatient Pharmacy Oral COVID Treatment Note  I connected with [name] on 08/07/2021/2:34 PM by telephone and verified that I am speaking with the correct person using two identifiers.  I discussed the limitations, risks, security, and privacy concerns of performing an evaluation and management service by telephone and the availability of in person appointments via referral to a physician. The patient expressed understanding and agreed to proceed.  Pharmacy location: [facility]  Diagnosis: COVID-19 infection  Purpose of visit: Discussion of potential use of Paxlovid, a new treatment for mild to moderate COVID-19 viral infection in non-hospitalized patients.  Subjective/Objective: Patient is a 40 y.o. female who is presenting with COVID 19 viral infection.  COVID 19 viral infection. Their symptoms began on 08/05/2021 with fever, chills, body aches, sore throat, congestion.  The patient has confirmed COVID-19 via a home test on 08/06/2021.   Past Medical History:  Diagnosis Date   Breast mass 07/26/2017   Right breast lump x 1 month    Eating disorder    Family history of cholangiocarcinoma    PONV (postoperative nausea and vomiting)    hypotensive passes out after epidurals   right breast ca    diagnosed 09/18/19   UTI (urinary tract infection)      Allergies  Allergen Reactions   Sulfa Antibiotics Hives   Sulfonylureas      Current Outpatient Medications:    albuterol (VENTOLIN HFA) 108 (90 Base) MCG/ACT inhaler, Inhale 2 puffs into the lungs every 6 (six) hours as needed for wheezing or shortness of breath., Disp: 8 g, Rfl: 2   ascorbic acid (VITAMIN C) 500 MG tablet, Take 500 mg by mouth daily., Disp: , Rfl:    BIOTIN PO, Take by mouth., Disp: , Rfl:    cholecalciferol (VITAMIN D3) 25 MCG (1000 UNIT) tablet, Take 1,000 Units by mouth daily., Disp: , Rfl:    COVID-19 At Home Antigen Test KIT, Use as directed, Disp: 4 kit, Rfl: 0   Cyanocobalamin (VITAMIN B-12  PO), Take by mouth., Disp: , Rfl:    escitalopram (LEXAPRO) 10 MG tablet, Take 1 tablet (10 mg total) by mouth daily., Disp: 30 tablet, Rfl: 0   fluticasone (FLOVENT HFA) 110 MCG/ACT inhaler, Inhale 2 puffs into the lungs in the morning and at bedtime., Disp: 1 each, Rfl: 3   ibuprofen (ADVIL) 600 MG tablet, Take 1 tablet (600 mg total) by mouth every 6 (six) hours., Disp: 30 tablet, Rfl: 0   letrozole (FEMARA) 2.5 MG tablet, Take 2.5 mg by mouth daily., Disp: , Rfl:    letrozole (FEMARA) 2.5 MG tablet, TAKE 1 TABLET BY MOUTH EVERY DAY, Disp: 150 tablet, Rfl: 90   Leuprolide Acetate (LUPRON DEPOT, 4-MONTH, IM), Inject into the muscle., Disp: , Rfl:    LORazepam (ATIVAN) 0.5 MG tablet, Take 1 tablet (0.5 mg total) by mouth 2 (two) times daily as needed for anxiety., Disp: 30 tablet, Rfl: 0   Multiple Vitamin (MULTIVITAMIN ADULT PO), Take by mouth., Disp: , Rfl:    PROBIOTIC PRODUCT PO, Take 1 capsule by mouth daily. , Disp: , Rfl:    Spacer/Aero-Holding Chambers (AEROCHAMBER PLUS) inhaler, Use as instructed to use with inahaler., Disp: 1 each, Rfl: 1  Lab Monitoring: eGFR 111  Drug Interactions Noted: none to be noted. Patient advised she does not take the lorazepam or flonase.   Plan:  This patient is a 40 y.o. female that meets the criteria for Emergency Use Authorization of Paxlovid. After reviewing the emergency use  authorization with the patient, the patient agrees to receive Paxlovid.  Through FDA guidance and current [facility] standing order Paxlovid will be prescribed to the patient.   Patient contacted for counseling on 08/07/2021 and verbalized understanding.   Delivery or Pick-Up Date: Pick-up by husband.   Follow up instructions:    Take prescription BID x 5 days as directed Counseling was provided by pharmacist. Reach out to pharmacist with follow up questions For concerns regarding further COVID symptoms please follow up with your PCP or urgent care For urgent or  life-threatening issues, seek care at your local emergency department   Lowella Bandy 08/07/2021, 2:34 [place] Pharmacist Phone# [phone]

## 2021-08-14 DIAGNOSIS — M50121 Cervical disc disorder at C4-C5 level with radiculopathy: Secondary | ICD-10-CM | POA: Diagnosis not present

## 2021-08-14 DIAGNOSIS — M6281 Muscle weakness (generalized): Secondary | ICD-10-CM | POA: Diagnosis not present

## 2021-08-14 DIAGNOSIS — M62838 Other muscle spasm: Secondary | ICD-10-CM | POA: Diagnosis not present

## 2021-08-15 ENCOUNTER — Other Ambulatory Visit: Payer: Self-pay

## 2021-08-15 ENCOUNTER — Other Ambulatory Visit (HOSPITAL_COMMUNITY): Payer: Self-pay

## 2021-08-15 ENCOUNTER — Other Ambulatory Visit (HOSPITAL_BASED_OUTPATIENT_CLINIC_OR_DEPARTMENT_OTHER): Payer: Self-pay

## 2021-08-15 MED ORDER — LETROZOLE 2.5 MG PO TABS
ORAL_TABLET | ORAL | 4 refills | Status: DC
  Filled 2021-08-15 (×2): qty 90, 90d supply, fill #0
  Filled 2021-11-02: qty 90, 90d supply, fill #1
  Filled 2022-01-07 – 2022-01-19 (×2): qty 90, 90d supply, fill #2
  Filled 2022-04-19: qty 90, 90d supply, fill #3
  Filled 2022-07-21: qty 90, 90d supply, fill #4

## 2021-08-24 DIAGNOSIS — M50121 Cervical disc disorder at C4-C5 level with radiculopathy: Secondary | ICD-10-CM | POA: Diagnosis not present

## 2021-08-24 DIAGNOSIS — M6281 Muscle weakness (generalized): Secondary | ICD-10-CM | POA: Diagnosis not present

## 2021-08-24 DIAGNOSIS — M62838 Other muscle spasm: Secondary | ICD-10-CM | POA: Diagnosis not present

## 2021-08-26 DIAGNOSIS — Z170 Estrogen receptor positive status [ER+]: Secondary | ICD-10-CM | POA: Diagnosis not present

## 2021-08-26 DIAGNOSIS — C50919 Malignant neoplasm of unspecified site of unspecified female breast: Secondary | ICD-10-CM | POA: Diagnosis not present

## 2021-09-01 ENCOUNTER — Other Ambulatory Visit (HOSPITAL_COMMUNITY): Payer: Self-pay

## 2021-09-01 MED ORDER — CARESTART COVID-19 HOME TEST VI KIT
PACK | 0 refills | Status: DC
  Filled 2021-09-01: qty 4, 4d supply, fill #0

## 2021-09-09 ENCOUNTER — Encounter (HOSPITAL_BASED_OUTPATIENT_CLINIC_OR_DEPARTMENT_OTHER): Payer: Self-pay | Admitting: Pharmacist

## 2021-09-09 ENCOUNTER — Other Ambulatory Visit: Payer: Self-pay | Admitting: Family Medicine

## 2021-09-09 ENCOUNTER — Other Ambulatory Visit (HOSPITAL_COMMUNITY): Payer: Self-pay

## 2021-09-09 ENCOUNTER — Other Ambulatory Visit (HOSPITAL_BASED_OUTPATIENT_CLINIC_OR_DEPARTMENT_OTHER): Payer: Self-pay

## 2021-09-09 DIAGNOSIS — M50121 Cervical disc disorder at C4-C5 level with radiculopathy: Secondary | ICD-10-CM | POA: Diagnosis not present

## 2021-09-09 DIAGNOSIS — M6281 Muscle weakness (generalized): Secondary | ICD-10-CM | POA: Diagnosis not present

## 2021-09-09 DIAGNOSIS — L2089 Other atopic dermatitis: Secondary | ICD-10-CM | POA: Diagnosis not present

## 2021-09-09 DIAGNOSIS — M62838 Other muscle spasm: Secondary | ICD-10-CM | POA: Diagnosis not present

## 2021-09-09 MED ORDER — ESCITALOPRAM OXALATE 10 MG PO TABS
10.0000 mg | ORAL_TABLET | Freq: Every day | ORAL | 0 refills | Status: DC
  Filled 2021-09-09: qty 30, 30d supply, fill #0

## 2021-09-09 MED ORDER — TRIAMCINOLONE ACETONIDE 0.025 % EX CREA
TOPICAL_CREAM | CUTANEOUS | 0 refills | Status: DC
  Filled 2021-09-09: qty 80, 30d supply, fill #0

## 2021-09-10 ENCOUNTER — Other Ambulatory Visit (HOSPITAL_BASED_OUTPATIENT_CLINIC_OR_DEPARTMENT_OTHER): Payer: Self-pay

## 2021-09-14 DIAGNOSIS — M50121 Cervical disc disorder at C4-C5 level with radiculopathy: Secondary | ICD-10-CM | POA: Diagnosis not present

## 2021-09-14 DIAGNOSIS — M62838 Other muscle spasm: Secondary | ICD-10-CM | POA: Diagnosis not present

## 2021-09-14 DIAGNOSIS — M6281 Muscle weakness (generalized): Secondary | ICD-10-CM | POA: Diagnosis not present

## 2021-09-18 ENCOUNTER — Other Ambulatory Visit (HOSPITAL_BASED_OUTPATIENT_CLINIC_OR_DEPARTMENT_OTHER): Payer: Self-pay

## 2021-09-18 MED ORDER — CARESTART COVID-19 HOME TEST VI KIT
PACK | 0 refills | Status: DC
  Filled 2021-09-18: qty 4, 4d supply, fill #0

## 2021-09-21 DIAGNOSIS — M50121 Cervical disc disorder at C4-C5 level with radiculopathy: Secondary | ICD-10-CM | POA: Diagnosis not present

## 2021-09-21 DIAGNOSIS — M62838 Other muscle spasm: Secondary | ICD-10-CM | POA: Diagnosis not present

## 2021-09-21 DIAGNOSIS — M6281 Muscle weakness (generalized): Secondary | ICD-10-CM | POA: Diagnosis not present

## 2021-09-23 DIAGNOSIS — C50919 Malignant neoplasm of unspecified site of unspecified female breast: Secondary | ICD-10-CM | POA: Diagnosis not present

## 2021-09-23 DIAGNOSIS — Z170 Estrogen receptor positive status [ER+]: Secondary | ICD-10-CM | POA: Diagnosis not present

## 2021-09-23 DIAGNOSIS — Z23 Encounter for immunization: Secondary | ICD-10-CM | POA: Diagnosis not present

## 2021-09-28 DIAGNOSIS — M62838 Other muscle spasm: Secondary | ICD-10-CM | POA: Diagnosis not present

## 2021-09-28 DIAGNOSIS — M6281 Muscle weakness (generalized): Secondary | ICD-10-CM | POA: Diagnosis not present

## 2021-09-28 DIAGNOSIS — M50121 Cervical disc disorder at C4-C5 level with radiculopathy: Secondary | ICD-10-CM | POA: Diagnosis not present

## 2021-10-12 DIAGNOSIS — M62838 Other muscle spasm: Secondary | ICD-10-CM | POA: Diagnosis not present

## 2021-10-12 DIAGNOSIS — M50121 Cervical disc disorder at C4-C5 level with radiculopathy: Secondary | ICD-10-CM | POA: Diagnosis not present

## 2021-10-12 DIAGNOSIS — M6281 Muscle weakness (generalized): Secondary | ICD-10-CM | POA: Diagnosis not present

## 2021-10-21 DIAGNOSIS — C50919 Malignant neoplasm of unspecified site of unspecified female breast: Secondary | ICD-10-CM | POA: Diagnosis not present

## 2021-10-21 DIAGNOSIS — Z170 Estrogen receptor positive status [ER+]: Secondary | ICD-10-CM | POA: Diagnosis not present

## 2021-10-23 DIAGNOSIS — M6281 Muscle weakness (generalized): Secondary | ICD-10-CM | POA: Diagnosis not present

## 2021-10-23 DIAGNOSIS — M62838 Other muscle spasm: Secondary | ICD-10-CM | POA: Diagnosis not present

## 2021-10-23 DIAGNOSIS — M50121 Cervical disc disorder at C4-C5 level with radiculopathy: Secondary | ICD-10-CM | POA: Diagnosis not present

## 2021-10-30 DIAGNOSIS — M50121 Cervical disc disorder at C4-C5 level with radiculopathy: Secondary | ICD-10-CM | POA: Diagnosis not present

## 2021-10-30 DIAGNOSIS — M6281 Muscle weakness (generalized): Secondary | ICD-10-CM | POA: Diagnosis not present

## 2021-10-30 DIAGNOSIS — M62838 Other muscle spasm: Secondary | ICD-10-CM | POA: Diagnosis not present

## 2021-11-02 ENCOUNTER — Other Ambulatory Visit: Payer: Self-pay | Admitting: Family Medicine

## 2021-11-03 ENCOUNTER — Other Ambulatory Visit (HOSPITAL_BASED_OUTPATIENT_CLINIC_OR_DEPARTMENT_OTHER): Payer: Self-pay

## 2021-11-03 ENCOUNTER — Encounter: Payer: Self-pay | Admitting: Physical Therapy

## 2021-11-03 ENCOUNTER — Other Ambulatory Visit: Payer: Self-pay

## 2021-11-03 ENCOUNTER — Ambulatory Visit: Payer: 59 | Attending: Nurse Practitioner | Admitting: Physical Therapy

## 2021-11-03 DIAGNOSIS — M79601 Pain in right arm: Secondary | ICD-10-CM | POA: Diagnosis not present

## 2021-11-03 DIAGNOSIS — C50311 Malignant neoplasm of lower-inner quadrant of right female breast: Secondary | ICD-10-CM | POA: Insufficient documentation

## 2021-11-03 DIAGNOSIS — Z170 Estrogen receptor positive status [ER+]: Secondary | ICD-10-CM | POA: Diagnosis not present

## 2021-11-03 DIAGNOSIS — R600 Localized edema: Secondary | ICD-10-CM | POA: Insufficient documentation

## 2021-11-03 MED ORDER — ESCITALOPRAM OXALATE 10 MG PO TABS
10.0000 mg | ORAL_TABLET | Freq: Every day | ORAL | 0 refills | Status: DC
  Filled 2021-11-03: qty 30, 30d supply, fill #0

## 2021-11-03 NOTE — Therapy (Signed)
[facility] @ [address] [place], [place], [address] Phone: [phone]   Fax:  [phone]  Physical Therapy Evaluation  Patient Details  Name: Tabitha Brown MRN: [id number] Date of Birth: 05-19-1981 Referring Provider (PT): Annamaria Boots   Encounter Date: 11/03/2021   PT End of Session - 11/03/21 1352     Visit Number 1    Number of Visits 5    Date for PT Re-Evaluation 12/01/21    PT Start Time 1304    PT Stop Time 1350    PT Time Calculation (min) 46 min    Activity Tolerance Patient tolerated treatment well    Behavior During Therapy [facility] for tasks assessed/performed             Past Medical History:  Diagnosis Date   Breast mass 07/26/2017   Right breast lump x 1 month    Eating disorder    Family history of cholangiocarcinoma    PONV (postoperative nausea and vomiting)    hypotensive passes out after epidurals   right breast ca    diagnosed 09/18/19   UTI (urinary tract infection)     Past Surgical History:  Procedure Laterality Date   APPENDECTOMY     AUGMENTATION MAMMAPLASTY Bilateral 2010   BREAST AUGMENTATION  2010   BREAST LUMPECTOMY WITH RADIOACTIVE SEED AND SENTINEL LYMPH NODE BIOPSY Right 10/18/2019   Procedure: RIGHT BREAST LUMPECTOMY WITH RADIOACTIVE SEED AND RIGHT AXILLARY SENTINEL LYMPH NODE BIOPSY;  Surgeon: Rolm Bookbinder, MD;  Location: [facility];  Service: General;  Laterality: Right;   RE-EXCISION OF BREAST CANCER,SUPERIOR MARGINS Right 11/01/2019   Procedure: RE-EXCISION OF RIGHT BREAST CANCER MARGINS;  Surgeon: Rolm Bookbinder, MD;  Location: [facility];  Service: General;  Laterality: Right;    There were no vitals filed for this visit.    Subjective Assessment - 11/03/21 1308     Subjective I started feeling heaviness in my arm after a long hike. I feel like I have to hold the arm up when I hike. It usually resolves in an hour or two.    Pertinent  History Patient was diagnosed on 09/05/2019 with right grade III invasive ductal carcinoma breast cancer. Patient underwent a right lumpectomy and sentinel node biopsy on 10/18/2019 when 3 negative nodes were removed followed by a right mastectomy on 12/17/2019. It is ER/PR positive and HER2 negative with a Ki67 of 10%. She has previous breast implants from 2010. She had a right mastectomy at [facility] on 12/17/2019 and plans ot have reconstruction after she delivers her baby. She is due 06/27/2020.    Patient Stated Goals to not get worse    Currently in Pain? No/denies    Pain Score 0-No pain                [facility] PT Assessment - 11/03/21 0001       Assessment   Medical Diagnosis right breast cancer     Referring Provider (PT) Annamaria Boots    Onset Date/Surgical Date 10/17/20    Hand Dominance Right      Precautions   Precautions Other (comment)    Precaution Comments at risk for lymphedema       Restrictions   Weight Bearing Restrictions No      Balance Screen   Has the patient fallen in the past 6 months No    Has the patient had a decrease in activity level because of a fear of falling?  No    Is the patient reluctant to leave their home because of a fear of falling?  No      Home Environment   Living Environment Private residence    Living Arrangements Spouse/significant other;Children    Available Help at Discharge Family      Prior Function   Level of [place] Part time employment   1 day a week   Medical illustrator    Leisure pt runs, walks, lifts weights low weight high reps      Cognition   Overall Cognitive Status Within Functional Limits for tasks assessed      AROM   Overall AROM Comments palpation of right radial pulse maintained throughout all ROM testing of right arm                LYMPHEDEMA/ONCOLOGY QUESTIONNAIRE - 11/03/21 0001       Right Upper Extremity Lymphedema   10 cm Proximal to Olecranon Process 21 cm     Olecranon Process 21.1 cm    10 cm Proximal to Ulnar Styloid Process 17.5 cm    Just Proximal to Ulnar Styloid Process 13.6 cm    Across Hand at [employer] 18 cm    At [place] of 2nd Digit 5.5 cm      Left Upper Extremity Lymphedema   10 cm Proximal to Olecranon Process 21.2 cm    Olecranon Process 20 cm    10 cm Proximal to Ulnar Styloid Process 17 cm    Just Proximal to Ulnar Styloid Process 14 cm    Across Hand at [employer] 18 cm    At [place] of 2nd Digit 5.6 cm             L-DEX FLOWSHEETS - 11/03/21 1300       L-DEX LYMPHEDEMA SCREENING   Measurement Type Unilateral    L-DEX MEASUREMENT EXTREMITY Upper Extremity    POSITION  Standing    DOMINANT SIDE Right    At Risk Side Right    BASELINE SCORE (UNILATERAL) -5.3                    Objective measurements completed on examination: See above findings.                     PT Long Term Goals - 11/03/21 1357       PT LONG TERM GOAL #1   Title Pt will obtain a custom compression sleeve and gauntlet to wear when exercising or flying to help decrease risk of lymphedema.    Time 4    Period Weeks    Status New    Target Date 12/01/21      PT LONG TERM GOAL #2   Title Pt will be independent in self MLD in case her arm begins to feel heavy.    Time 4    Period Weeks    Status New    Target Date 12/01/21                    Plan - 11/03/21 1352     Clinical Impression Statement Pt reports to PT with feelings of heaviness in her right arm especially her forearm after walking or hiking for long periods of time. She reports she holds her arm up over her head and this seems to help. She is here today to be assessed for any signs or symptoms  of lymphedema. Pt did not have baseline L-dex measurements so took one today. Will reassess this again in three months. There are no measureable differences between R and L UE circumferences to indicate lymphedema. Educated pt that  sometimes lymphedema begins with feelings of heaviness and to keep a close eye on it. Will have pt measured for a custom compression sleeve and gauntlet since pt does not fit in to an off the shelf due to small UE circumferences. She would also benefit from instruction in self MLD to use when her arm is feeling more full.    Rehab Potential Excellent    PT Frequency 1x / week    PT Duration 4 weeks    PT Treatment/Interventions ADLs/Self Care Home Management;Therapeutic exercise;Patient/family education;Manual techniques;Manual lymph drainage;Orthotic Fit/Training;Compression bandaging    PT Next Visit Plan measure for custom Medi circular knit sleeve for prophylactic use - pt feels increased fullness in her UE when exercising, instruct in self MLD    Consulted and Agree with Plan of Care Patient             Patient will benefit from skilled therapeutic intervention in order to improve the following deficits and impairments:  Decreased knowledge of precautions, Increased edema  Visit Diagnosis: Localized edema  Malignant neoplasm of lower-inner quadrant of right breast of female, estrogen receptor positive ([place])     Problem List Patient Active Problem List   Diagnosis Date Noted   Asthma, exercise induced 11/14/2020   Indication for care in labor and delivery, antepartum 06/06/2020   SVD (spontaneous vaginal delivery) 06/06/2020   Genetic testing 10/03/2019   Family history of cholangiocarcinoma    Malignant neoplasm of lower-inner quadrant of right breast of female, estrogen receptor positive ([place]) 09/19/2019    Allyson Sabal [place], PT 11/03/2021, 1:59 PM  [facility] @ [place], [place], [address] Phone: [phone]   Fax:  [phone]  Name: Meloney Feld MRN: [id number] Date of Birth: 07-Nov-1981  Manus Gunning, PT 11/03/21 1:59 PM

## 2021-11-13 DIAGNOSIS — M50121 Cervical disc disorder at C4-C5 level with radiculopathy: Secondary | ICD-10-CM | POA: Diagnosis not present

## 2021-11-13 DIAGNOSIS — M6281 Muscle weakness (generalized): Secondary | ICD-10-CM | POA: Diagnosis not present

## 2021-11-13 DIAGNOSIS — M62838 Other muscle spasm: Secondary | ICD-10-CM | POA: Diagnosis not present

## 2021-11-18 DIAGNOSIS — C50919 Malignant neoplasm of unspecified site of unspecified female breast: Secondary | ICD-10-CM | POA: Diagnosis not present

## 2021-11-18 DIAGNOSIS — Z170 Estrogen receptor positive status [ER+]: Secondary | ICD-10-CM | POA: Diagnosis not present

## 2021-11-19 ENCOUNTER — Other Ambulatory Visit: Payer: Self-pay

## 2021-11-19 ENCOUNTER — Ambulatory Visit: Payer: 59 | Attending: Nurse Practitioner | Admitting: Rehabilitation

## 2021-11-19 DIAGNOSIS — R293 Abnormal posture: Secondary | ICD-10-CM | POA: Diagnosis not present

## 2021-11-19 DIAGNOSIS — M25611 Stiffness of right shoulder, not elsewhere classified: Secondary | ICD-10-CM | POA: Insufficient documentation

## 2021-11-19 DIAGNOSIS — M79601 Pain in right arm: Secondary | ICD-10-CM | POA: Insufficient documentation

## 2021-11-19 DIAGNOSIS — Z170 Estrogen receptor positive status [ER+]: Secondary | ICD-10-CM | POA: Insufficient documentation

## 2021-11-19 DIAGNOSIS — C50311 Malignant neoplasm of lower-inner quadrant of right female breast: Secondary | ICD-10-CM | POA: Diagnosis not present

## 2021-11-19 DIAGNOSIS — R600 Localized edema: Secondary | ICD-10-CM | POA: Diagnosis not present

## 2021-11-19 DIAGNOSIS — Z483 Aftercare following surgery for neoplasm: Secondary | ICD-10-CM | POA: Insufficient documentation

## 2021-11-19 NOTE — Patient Instructions (Signed)
Deep Effective Breath   Standing, sitting, or laying down, place both hands on the belly. Take a deep breath IN, expanding the belly; then breath OUT, contracting the belly.    Copyright  VHI. All rights reserved.  Axilla to Axilla - Sweep   On both sides make 5 circles in the armpit, then pump _5__ times from involved armpit across chest to uninvolved armpit, making a pathway.  Marland Kitchen  Axilla to Inguinal Nodes - Sweep   On involved side, make 5 circles at groin at panty line, then pump _5__ times from armpit along side of trunk to outer hip, making your other pathway.   Arm Posterior: Elbow to Shoulder - Sweep   Pump _5__ times from back of elbow to top of shoulder.   Then inner to outer upper arm _5_ times, then outer arm again _5_ times.   Then back to the pathways _2-3_ times.   ARM: [place] Wrist to Elbow - Sweep    Pump or stationary circles _5__ times from wrist to elbow making sure to do both sides of the forearm. Then retrace your steps to the outer arm, and the pathways _2-3_ times each. Do _1__ time per day.  Copyright  VHI. All rights reserved.  ARM: Dorsum of Hand to Shoulder - Sweep   Pump or stationary circles _5__ times on back of hand including knuckle spaces and individual fingers if needed working up towards the wrist, then retrace all your steps working back up the forearm, doing both sides; upper outer arm and back to your pathways _2-3_ times each. Then do 5 circles again at uninvolved armpit and involved groin where you started! Good job!! Do __1_ time per day.  Copyright  VHI. All rights reserved.

## 2021-11-19 NOTE — Therapy (Addendum)
 [facility] @ [facility] [address] [place], [place], [address] Phone: [phone]   Fax:  [phone]  Physical Therapy Treatment  Patient Details  Name: Tabitha Brown MRN: [id number] Date of Birth: 05-27-1981 Referring Provider (PT): Benita Gutter   Encounter Date: 11/19/2021   PT End of Session - 11/19/21 0929     Visit Number 2    Number of Visits 5    Date for PT Re-Evaluation 12/01/21    PT Start Time 0905    PT Stop Time 0927    PT Time Calculation (min) 22 min    Activity Tolerance Patient tolerated treatment well    Behavior During Therapy [facility] for tasks assessed/performed             Past Medical History:  Diagnosis Date   Breast mass 07/26/2017   Right breast lump x 1 month    Eating disorder    Family history of cholangiocarcinoma    PONV (postoperative nausea and vomiting)    hypotensive passes out after epidurals   right breast ca    diagnosed 09/18/19   UTI (urinary tract infection)     Past Surgical History:  Procedure Laterality Date   APPENDECTOMY     AUGMENTATION MAMMAPLASTY Bilateral 2010   BREAST AUGMENTATION  2010   BREAST LUMPECTOMY WITH RADIOACTIVE SEED AND SENTINEL LYMPH NODE BIOPSY Right 10/18/2019   Procedure: RIGHT BREAST LUMPECTOMY WITH RADIOACTIVE SEED AND RIGHT AXILLARY SENTINEL LYMPH NODE BIOPSY;  Surgeon: Emelia Loron, MD;  [facility];  Service: General;  Laterality: Right;   RE-EXCISION OF BREAST CANCER,SUPERIOR MARGINS Right 11/01/2019   Procedure: RE-EXCISION OF RIGHT BREAST CANCER MARGINS;  Surgeon: Emelia Loron, MD;  Location: [facility];  Service: General;  Laterality: Right;    There were no vitals filed for this visit.   Subjective Assessment - 11/19/21 0903     Subjective Doing ok.    Pertinent History Patient was diagnosed on 09/05/2019 with right grade III invasive ductal carcinoma breast cancer. Patient underwent a right lumpectomy and  sentinel node biopsy on 10/18/2019 when 3 negative nodes were removed followed by a right mastectomy on 12/17/2019. It is ER/PR positive and HER2 negative with a Ki67 of 10%. She has previous breast implants from 2010. She had a right mastectomy at [facility] on 12/17/2019 and plans ot have reconstruction after she delivers her baby. She is due 06/27/2020.    Currently in Pain? No/denies                               [facility] Adult PT Treatment/Exercise - 11/19/21 0001       Manual Therapy   Manual Therapy Edema management    Manual therapy comments gave pt ordering information for the Juzo dynamic size 1 as she fits in the lower end of this size chart with added gauntlet over the wrist just in case.  Pt will let us know if this does not work and we will most likely have to get custom Juzo dynamic?    Edema Management quick review of self MLD as pt reports she has been doing this from an online source                          PT Long Term Goals - 11/03/21 1357       PT LONG TERM GOAL #1  Title Pt will obtain a custom compression sleeve and gauntlet to wear when exercising or flying to help decrease risk of lymphedema.    Time 4    Period Weeks    Status New    Target Date 12/01/21      PT LONG TERM GOAL #2   Title Pt will be independent in self MLD in case her arm begins to feel heavy.    Time 4    Period Weeks    Status New    Target Date 12/01/21                   Plan - 11/19/21 0930     Clinical Impression Statement gave ordering information for sleeve per treatment section.  Pt needing more more visits at this time.    Consulted and Agree with Plan of Care Patient             Patient will benefit from skilled therapeutic intervention in order to improve the following deficits and impairments:     Visit Diagnosis: Localized edema  Pain in right arm  Malignant neoplasm of lower-inner quadrant of right breast of female, estrogen  receptor positive ([facility])  Aftercare following surgery for neoplasm  Abnormal posture  Stiffness of right shoulder, not elsewhere classified     Problem List Patient Active Problem List   Diagnosis Date Noted   Asthma, exercise induced 11/14/2020   Indication for care in labor and delivery, antepartum 06/06/2020   SVD (spontaneous vaginal delivery) 06/06/2020   Genetic testing 10/03/2019   Family history of cholangiocarcinoma    Malignant neoplasm of lower-inner quadrant of right breast of female, estrogen receptor positive ([facility]) 09/19/2019    Idamae Lusher, PT 11/19/2021, 9:31 AM  [facility] @ [facility] [address] [place], [place], [address] Phone: [phone]   Fax:  [phone]  Name: Kaysi Ourada MRN: [id number] Date of Birth: 03/16/1981  PHYSICAL THERAPY DISCHARGE SUMMARY  Visits from Start of Care: 2  Current functional level related to goals / functional outcomes: No return after last visit.

## 2021-11-20 DIAGNOSIS — M50121 Cervical disc disorder at C4-C5 level with radiculopathy: Secondary | ICD-10-CM | POA: Diagnosis not present

## 2021-11-20 DIAGNOSIS — M6281 Muscle weakness (generalized): Secondary | ICD-10-CM | POA: Diagnosis not present

## 2021-11-20 DIAGNOSIS — M62838 Other muscle spasm: Secondary | ICD-10-CM | POA: Diagnosis not present

## 2021-11-24 ENCOUNTER — Encounter: Payer: 59 | Admitting: Physical Therapy

## 2021-12-11 DIAGNOSIS — Z9011 Acquired absence of right breast and nipple: Secondary | ICD-10-CM | POA: Diagnosis not present

## 2021-12-11 DIAGNOSIS — Z170 Estrogen receptor positive status [ER+]: Secondary | ICD-10-CM | POA: Diagnosis not present

## 2021-12-11 DIAGNOSIS — C50011 Malignant neoplasm of nipple and areola, right female breast: Secondary | ICD-10-CM | POA: Diagnosis not present

## 2021-12-11 DIAGNOSIS — N6452 Nipple discharge: Secondary | ICD-10-CM | POA: Diagnosis not present

## 2021-12-11 DIAGNOSIS — N644 Mastodynia: Secondary | ICD-10-CM | POA: Diagnosis not present

## 2021-12-17 DIAGNOSIS — Z170 Estrogen receptor positive status [ER+]: Secondary | ICD-10-CM | POA: Diagnosis not present

## 2021-12-17 DIAGNOSIS — Z9889 Other specified postprocedural states: Secondary | ICD-10-CM | POA: Diagnosis not present

## 2021-12-17 DIAGNOSIS — M8580 Other specified disorders of bone density and structure, unspecified site: Secondary | ICD-10-CM | POA: Diagnosis not present

## 2021-12-17 DIAGNOSIS — R232 Flushing: Secondary | ICD-10-CM | POA: Diagnosis not present

## 2021-12-17 DIAGNOSIS — C50011 Malignant neoplasm of nipple and areola, right female breast: Secondary | ICD-10-CM | POA: Diagnosis not present

## 2021-12-17 DIAGNOSIS — G500 Trigeminal neuralgia: Secondary | ICD-10-CM | POA: Diagnosis not present

## 2021-12-17 DIAGNOSIS — Z9011 Acquired absence of right breast and nipple: Secondary | ICD-10-CM | POA: Diagnosis not present

## 2021-12-17 DIAGNOSIS — D0511 Intraductal carcinoma in situ of right breast: Secondary | ICD-10-CM | POA: Diagnosis not present

## 2021-12-17 DIAGNOSIS — C50311 Malignant neoplasm of lower-inner quadrant of right female breast: Secondary | ICD-10-CM | POA: Diagnosis not present

## 2021-12-17 DIAGNOSIS — N644 Mastodynia: Secondary | ICD-10-CM | POA: Diagnosis not present

## 2021-12-17 DIAGNOSIS — N6452 Nipple discharge: Secondary | ICD-10-CM | POA: Diagnosis not present

## 2021-12-22 DIAGNOSIS — M6281 Muscle weakness (generalized): Secondary | ICD-10-CM | POA: Diagnosis not present

## 2021-12-22 DIAGNOSIS — M50121 Cervical disc disorder at C4-C5 level with radiculopathy: Secondary | ICD-10-CM | POA: Diagnosis not present

## 2021-12-22 DIAGNOSIS — M62838 Other muscle spasm: Secondary | ICD-10-CM | POA: Diagnosis not present

## 2021-12-25 ENCOUNTER — Other Ambulatory Visit (HOSPITAL_BASED_OUTPATIENT_CLINIC_OR_DEPARTMENT_OTHER): Payer: Self-pay

## 2021-12-25 ENCOUNTER — Encounter (HOSPITAL_BASED_OUTPATIENT_CLINIC_OR_DEPARTMENT_OTHER): Payer: Self-pay | Admitting: Pharmacist

## 2021-12-25 MED ORDER — OXYBUTYNIN CHLORIDE 5 MG PO TABS
ORAL_TABLET | ORAL | 11 refills | Status: DC
  Filled 2021-12-25: qty 30, 30d supply, fill #0
  Filled 2022-01-19: qty 30, 30d supply, fill #1

## 2021-12-26 ENCOUNTER — Other Ambulatory Visit (HOSPITAL_BASED_OUTPATIENT_CLINIC_OR_DEPARTMENT_OTHER): Payer: Self-pay

## 2021-12-28 ENCOUNTER — Other Ambulatory Visit (HOSPITAL_BASED_OUTPATIENT_CLINIC_OR_DEPARTMENT_OTHER): Payer: Self-pay

## 2021-12-28 ENCOUNTER — Encounter: Payer: Self-pay | Admitting: Rehabilitation

## 2022-01-06 DIAGNOSIS — M6281 Muscle weakness (generalized): Secondary | ICD-10-CM | POA: Diagnosis not present

## 2022-01-06 DIAGNOSIS — M62838 Other muscle spasm: Secondary | ICD-10-CM | POA: Diagnosis not present

## 2022-01-06 DIAGNOSIS — M50121 Cervical disc disorder at C4-C5 level with radiculopathy: Secondary | ICD-10-CM | POA: Diagnosis not present

## 2022-01-07 ENCOUNTER — Other Ambulatory Visit (HOSPITAL_BASED_OUTPATIENT_CLINIC_OR_DEPARTMENT_OTHER): Payer: Self-pay

## 2022-01-07 ENCOUNTER — Other Ambulatory Visit: Payer: Self-pay | Admitting: Family Medicine

## 2022-01-07 ENCOUNTER — Other Ambulatory Visit: Payer: Self-pay

## 2022-01-07 MED ORDER — ESCITALOPRAM OXALATE 10 MG PO TABS
10.0000 mg | ORAL_TABLET | Freq: Every day | ORAL | 0 refills | Status: DC
  Filled 2022-01-07 – 2022-01-19 (×2): qty 30, 30d supply, fill #0

## 2022-01-13 DIAGNOSIS — C50011 Malignant neoplasm of nipple and areola, right female breast: Secondary | ICD-10-CM | POA: Diagnosis not present

## 2022-01-13 DIAGNOSIS — E639 Nutritional deficiency, unspecified: Secondary | ICD-10-CM | POA: Diagnosis not present

## 2022-01-13 DIAGNOSIS — C50919 Malignant neoplasm of unspecified site of unspecified female breast: Secondary | ICD-10-CM | POA: Diagnosis not present

## 2022-01-13 DIAGNOSIS — F419 Anxiety disorder, unspecified: Secondary | ICD-10-CM | POA: Diagnosis not present

## 2022-01-13 DIAGNOSIS — Z170 Estrogen receptor positive status [ER+]: Secondary | ICD-10-CM | POA: Diagnosis not present

## 2022-01-13 DIAGNOSIS — M8580 Other specified disorders of bone density and structure, unspecified site: Secondary | ICD-10-CM | POA: Diagnosis not present

## 2022-01-13 DIAGNOSIS — G479 Sleep disorder, unspecified: Secondary | ICD-10-CM | POA: Diagnosis not present

## 2022-01-15 DIAGNOSIS — M6281 Muscle weakness (generalized): Secondary | ICD-10-CM | POA: Diagnosis not present

## 2022-01-15 DIAGNOSIS — M50121 Cervical disc disorder at C4-C5 level with radiculopathy: Secondary | ICD-10-CM | POA: Diagnosis not present

## 2022-01-15 DIAGNOSIS — M62838 Other muscle spasm: Secondary | ICD-10-CM | POA: Diagnosis not present

## 2022-01-19 ENCOUNTER — Other Ambulatory Visit (HOSPITAL_BASED_OUTPATIENT_CLINIC_OR_DEPARTMENT_OTHER): Payer: Self-pay

## 2022-01-27 DIAGNOSIS — M50121 Cervical disc disorder at C4-C5 level with radiculopathy: Secondary | ICD-10-CM | POA: Diagnosis not present

## 2022-01-27 DIAGNOSIS — M62838 Other muscle spasm: Secondary | ICD-10-CM | POA: Diagnosis not present

## 2022-01-27 DIAGNOSIS — M6281 Muscle weakness (generalized): Secondary | ICD-10-CM | POA: Diagnosis not present

## 2022-02-04 DIAGNOSIS — C50011 Malignant neoplasm of nipple and areola, right female breast: Secondary | ICD-10-CM | POA: Diagnosis not present

## 2022-02-04 DIAGNOSIS — Z170 Estrogen receptor positive status [ER+]: Secondary | ICD-10-CM | POA: Diagnosis not present

## 2022-02-05 DIAGNOSIS — M50121 Cervical disc disorder at C4-C5 level with radiculopathy: Secondary | ICD-10-CM | POA: Diagnosis not present

## 2022-02-05 DIAGNOSIS — M6281 Muscle weakness (generalized): Secondary | ICD-10-CM | POA: Diagnosis not present

## 2022-02-05 DIAGNOSIS — M62838 Other muscle spasm: Secondary | ICD-10-CM | POA: Diagnosis not present

## 2022-02-10 DIAGNOSIS — M50121 Cervical disc disorder at C4-C5 level with radiculopathy: Secondary | ICD-10-CM | POA: Diagnosis not present

## 2022-02-10 DIAGNOSIS — M6281 Muscle weakness (generalized): Secondary | ICD-10-CM | POA: Diagnosis not present

## 2022-02-10 DIAGNOSIS — M62838 Other muscle spasm: Secondary | ICD-10-CM | POA: Diagnosis not present

## 2022-02-26 DIAGNOSIS — M62838 Other muscle spasm: Secondary | ICD-10-CM | POA: Diagnosis not present

## 2022-02-26 DIAGNOSIS — M6281 Muscle weakness (generalized): Secondary | ICD-10-CM | POA: Diagnosis not present

## 2022-02-26 DIAGNOSIS — M50121 Cervical disc disorder at C4-C5 level with radiculopathy: Secondary | ICD-10-CM | POA: Diagnosis not present

## 2022-03-05 DIAGNOSIS — Z170 Estrogen receptor positive status [ER+]: Secondary | ICD-10-CM | POA: Diagnosis not present

## 2022-03-05 DIAGNOSIS — C50011 Malignant neoplasm of nipple and areola, right female breast: Secondary | ICD-10-CM | POA: Diagnosis not present

## 2022-03-15 ENCOUNTER — Other Ambulatory Visit: Payer: Self-pay | Admitting: Family Medicine

## 2022-03-16 ENCOUNTER — Other Ambulatory Visit (HOSPITAL_BASED_OUTPATIENT_CLINIC_OR_DEPARTMENT_OTHER): Payer: Self-pay

## 2022-03-16 ENCOUNTER — Encounter (HOSPITAL_BASED_OUTPATIENT_CLINIC_OR_DEPARTMENT_OTHER): Payer: Self-pay | Admitting: Pharmacist

## 2022-03-17 DIAGNOSIS — M50121 Cervical disc disorder at C4-C5 level with radiculopathy: Secondary | ICD-10-CM | POA: Diagnosis not present

## 2022-03-17 DIAGNOSIS — M6281 Muscle weakness (generalized): Secondary | ICD-10-CM | POA: Diagnosis not present

## 2022-03-17 DIAGNOSIS — M62838 Other muscle spasm: Secondary | ICD-10-CM | POA: Diagnosis not present

## 2022-03-17 DIAGNOSIS — H5213 Myopia, bilateral: Secondary | ICD-10-CM | POA: Diagnosis not present

## 2022-03-19 ENCOUNTER — Other Ambulatory Visit (HOSPITAL_BASED_OUTPATIENT_CLINIC_OR_DEPARTMENT_OTHER): Payer: Self-pay

## 2022-03-22 ENCOUNTER — Other Ambulatory Visit (HOSPITAL_BASED_OUTPATIENT_CLINIC_OR_DEPARTMENT_OTHER): Payer: Self-pay

## 2022-03-24 DIAGNOSIS — C50919 Malignant neoplasm of unspecified site of unspecified female breast: Secondary | ICD-10-CM | POA: Diagnosis not present

## 2022-03-24 DIAGNOSIS — Z170 Estrogen receptor positive status [ER+]: Secondary | ICD-10-CM | POA: Diagnosis not present

## 2022-03-24 NOTE — Progress Notes (Signed)
? ?HPI: ?Ms.Labrea Eccleston is a 41 y.o. female, who is here today for her routine physical. ? ?Last CPE: unsure, over a year ago. ? ?Regular exercise: She walks 4-5 miles several times per week, wt lifting 6-7 days per week. ?She follows a very healthful diet, plenty of vegetables and fruits. ? ?Chronic medical problems: Right breast cancer on ovarian suppression therapy, anxiety, IBS, and cervical radiculopathy among some. ? ?Immunization History  ?Administered Date(s) Administered  ? Influenza,inj,Quad PF,6+ Mos 09/21/2019, 10/21/2020  ? Influenza-Unspecified 09/21/2019, 10/23/2020  ? Moderna Sars-Covid-2 Vaccination 12/11/2019, 01/14/2020, 07/31/2020  ? Tdap 11/23/2016  ? ?Health Maintenance  ?Topic Date Due  ? PAP SMEAR-Modifier  10/27/2018  ? COVID-19 Vaccine (4 - Booster for Moderna series) 04/11/2022 (Originally 09/25/2020)  ? INFLUENZA VACCINE  07/13/2022  ? TETANUS/TDAP  11/23/2026  ? Hepatitis C Screening  Completed  ? HIV Screening  Completed  ? HPV VACCINES  Aged Out  ? ?Concerns today: ?Requesting a celiac panel. ?FHx positive, her sister with hx of celiac disease. ?Hx of IBS, used to have diarrhea a few years ago, now it is more that constipation. She is concerned because it does not seem like she is absorbing nutrients. She sees integrative medicine provider and most recent blood work showed low vitamins. ?Intermittent episodes of "gas pain." ?Negative for melena, fat in stool,or mucus/blood. ? ?Anxiety: Currently she is on Lexapro 10 mg 1/2 tablet daily and takes lorazepam 0.5 mg daily as needed, about once per month. ?She has tolerated medication well, no side effects, medications are still helping. ? ?Right second toe onychomycosis: Problem has been going on for a while. She has treated it with tea tree oil, seems stable. Negative for periungual erythema or edema. ? ?Review of Systems  ?Constitutional:  Positive for fatigue. Negative for appetite change and fever.  ?HENT:  Negative for hearing  loss, mouth sores and sore throat.   ?Eyes:  Negative for redness and visual disturbance.  ?Respiratory:  Negative for cough, shortness of breath and wheezing.   ?Cardiovascular:  Negative for chest pain and leg swelling.  ?Gastrointestinal:  Negative for nausea and vomiting.  ?     No changes in bowel habits.  ?Endocrine: Negative for cold intolerance, heat intolerance, polydipsia, polyphagia and polyuria.  ?Genitourinary:  Negative for decreased urine volume, dysuria, hematuria, vaginal bleeding and vaginal discharge.  ?Musculoskeletal:  Positive for neck pain. Negative for gait problem and myalgias.  ?Skin:  Negative for color change and rash.  ?Allergic/Immunologic: Positive for environmental allergies.  ?Neurological:  Negative for syncope, weakness and headaches.  ?Hematological:  Negative for adenopathy. Does not bruise/bleed easily.  ?Psychiatric/Behavioral:  Negative for confusion and hallucinations.   ?All other systems reviewed and are negative. ? ?Current Outpatient Medications on File Prior to Visit  ?Medication Sig Dispense Refill  ? albuterol (VENTOLIN HFA) 108 (90 Base) MCG/ACT inhaler Inhale 2 puffs into the lungs every 6 (six) hours as needed for wheezing or shortness of breath. 8 g 2  ? ascorbic acid (VITAMIN C) 500 MG tablet Take 500 mg by mouth daily.    ? BIOTIN PO Take by mouth.    ? cholecalciferol (VITAMIN D3) 25 MCG (1000 UNIT) tablet Take 1,000 Units by mouth daily.    ? COVID-19 At Home Antigen Test [facility] COVID-19 HOME TEST) KIT Use as directed 4 each 0  ? COVID-19 At Home Antigen Test (CARESTART COVID-19 HOME TEST) KIT Use as directed per package instructions 4 each 0  ? Cyanocobalamin (  VITAMIN B-12 PO) Take by mouth.    ? fluticasone (FLOVENT HFA) 110 MCG/ACT inhaler Inhale 2 puffs into the lungs in the morning and at bedtime. 1 each 3  ? ibuprofen (ADVIL) 600 MG tablet Take 1 tablet (600 mg total) by mouth every 6 (six) hours. 30 tablet 0  ? letrozole (FEMARA) 2.5 MG tablet Take  2.5 mg by mouth daily.    ? letrozole (FEMARA) 2.5 MG tablet Take 1 tablet (2.5 mg total) by mouth once daily 90 tablet 4  ? Leuprolide Acetate (LUPRON DEPOT, 47-MONTH, IM) Inject into the muscle.    ? Multiple Vitamin (MULTIVITAMIN ADULT PO) Take by mouth.    ? oxybutynin (DITROPAN) 5 MG tablet Take 1/2 tablets (2.5 mg total) by mouth 2 (two) times daily 30 tablet 11  ? PROBIOTIC PRODUCT PO Take 1 capsule by mouth daily.     ? Spacer/Aero-Holding Chambers (AEROCHAMBER PLUS) inhaler Use as instructed to use with inahaler. 1 each 1  ? triamcinolone (KENALOG) 0.025 % cream Apply to the affected area twice daily prn. 80 g 0  ? ?No current facility-administered medications on file prior to visit.  ? ? ? ?Past Medical History:  ?Diagnosis Date  ? Breast mass 07/26/2017  ? Right breast lump x 1 month   ? Eating disorder   ? Family history of cholangiocarcinoma   ? PONV (postoperative nausea and vomiting)   ? hypotensive passes out after epidurals  ? right breast ca   ? diagnosed 09/18/19  ? UTI (urinary tract infection)   ? ? ?Past Surgical History:  ?Procedure Laterality Date  ? APPENDECTOMY    ? AUGMENTATION MAMMAPLASTY Bilateral 2010  ? BREAST AUGMENTATION  2010  ? BREAST LUMPECTOMY WITH RADIOACTIVE SEED AND SENTINEL LYMPH NODE BIOPSY Right 10/18/2019  ? Procedure: RIGHT BREAST LUMPECTOMY WITH RADIOACTIVE SEED AND RIGHT AXILLARY SENTINEL LYMPH NODE BIOPSY;  Surgeon: Rolm Bookbinder, MD;  Location: [facility];  Service: General;  Laterality: Right;  ? RE-EXCISION OF BREAST CANCER,SUPERIOR MARGINS Right 11/01/2019  ? Procedure: RE-EXCISION OF RIGHT BREAST CANCER MARGINS;  Surgeon: Rolm Bookbinder, MD;  Location: [facility];  Service: General;  Laterality: Right;  ? ? ?Allergies  ?Allergen Reactions  ? Sulfa Antibiotics Hives  ? Sulfonylureas   ? ? ?Family History  ?Problem Relation Age of Onset  ? Alcohol abuse Maternal Grandfather   ?     bile duct cancer  ? Heart disease Maternal Grandfather   ? Cancer  Maternal Grandfather 89  ?     bile duct cancer  ? Hypertension Maternal Grandfather   ? Hypertension Paternal Grandfather   ? Heart disease Paternal Grandfather   ? ? ?Social History  ? ?Socioeconomic History  ? Marital status: Married  ?  Spouse name: Jaeley Wiker  ? Number of children: 2  ? Years of education: Not on file  ? Highest education level: Not on file  ?Occupational History  ? Not on file  ?Tobacco Use  ? Smoking status: Never  ? Smokeless tobacco: Never  ?Vaping Use  ? Vaping Use: Never used  ?Substance and Sexual Activity  ? Alcohol use: Yes  ?  Comment: 1-2  ? Drug use: No  ? Sexual activity: Yes  ?Other Topics Concern  ? Not on file  ?Social History Narrative  ? Not on file  ? ?Social Determinants of Health  ? ?Financial Resource Strain: Not on file  ?Food Insecurity: Not on file  ?Transportation Needs: Not on file  ?  Physical Activity: Not on file  ?Stress: Not on file  ?Social Connections: Not on file  ? ?Vitals:  ? 03/26/22 1227  ?BP: 118/60  ?Pulse: 62  ?Resp: 12  ?SpO2: 99%  ? ?Body mass index is 20.08 kg/m?. ? ?Wt Readings from Last 3 Encounters:  ?03/26/22 106 lb 4 oz (48.2 kg)  ?11/14/20 101 lb 6.4 oz (46 kg)  ?10/30/20 102 lb (46.3 kg)  ? ?Physical Exam ?Vitals and nursing note reviewed.  ?Constitutional:   ?   General: She is not in acute distress. ?   Appearance: She is well-developed and normal weight.  ?HENT:  ?   Head: Normocephalic and atraumatic.  ?   Right Ear: Hearing, tympanic membrane, ear canal and external ear normal.  ?   Left Ear: Hearing, tympanic membrane, ear canal and external ear normal.  ?   Mouth/Throat:  ?   Mouth: Mucous membranes are moist.  ?   Pharynx: Oropharynx is clear. Uvula midline.  ?Eyes:  ?   Extraocular Movements: Extraocular movements intact.  ?   Conjunctiva/sclera: Conjunctivae normal.  ?   Pupils: Pupils are equal, round, and reactive to light.  ?Neck:  ?   Thyroid: No thyromegaly.  ?   Trachea: No tracheal deviation.  ?Cardiovascular:  ?   Rate and  Rhythm: Normal rate and regular rhythm.  ?   Pulses:     ?     Dorsalis pedis pulses are 2+ on the right side and 2+ on the left side.  ?   Heart sounds: No murmur heard. ?Pulmonary:  ?   Effort: Pulmonary

## 2022-03-26 ENCOUNTER — Ambulatory Visit (INDEPENDENT_AMBULATORY_CARE_PROVIDER_SITE_OTHER): Payer: 59 | Admitting: Family Medicine

## 2022-03-26 ENCOUNTER — Encounter: Payer: Self-pay | Admitting: Family Medicine

## 2022-03-26 VITALS — BP 118/60 | HR 62 | Resp 12 | Ht 61.0 in | Wt 106.2 lb

## 2022-03-26 DIAGNOSIS — Z1159 Encounter for screening for other viral diseases: Secondary | ICD-10-CM | POA: Diagnosis not present

## 2022-03-26 DIAGNOSIS — R198 Other specified symptoms and signs involving the digestive system and abdomen: Secondary | ICD-10-CM | POA: Diagnosis not present

## 2022-03-26 DIAGNOSIS — Z0000 Encounter for general adult medical examination without abnormal findings: Secondary | ICD-10-CM | POA: Diagnosis not present

## 2022-03-26 DIAGNOSIS — C50311 Malignant neoplasm of lower-inner quadrant of right female breast: Secondary | ICD-10-CM

## 2022-03-26 DIAGNOSIS — F419 Anxiety disorder, unspecified: Secondary | ICD-10-CM | POA: Diagnosis not present

## 2022-03-26 DIAGNOSIS — B351 Tinea unguium: Secondary | ICD-10-CM | POA: Diagnosis not present

## 2022-03-26 DIAGNOSIS — Z170 Estrogen receptor positive status [ER+]: Secondary | ICD-10-CM | POA: Diagnosis not present

## 2022-03-26 DIAGNOSIS — Z13220 Encounter for screening for lipoid disorders: Secondary | ICD-10-CM | POA: Diagnosis not present

## 2022-03-26 DIAGNOSIS — Z130 Encounter for screening for diseases of the blood and blood-forming organs and certain disorders involving the immune mechanism: Secondary | ICD-10-CM

## 2022-03-26 LAB — LIPID PANEL
Cholesterol: 171 mg/dL (ref 0–200)
HDL: 59.7 mg/dL (ref >39.00)
LDL Cholesterol: 98 mg/dL (ref 0–99)
NonHDL: 111.72
Total CHOL/HDL Ratio: 3
Triglycerides: 71 mg/dL (ref 0.0–149.0)
VLDL: 14.2 mg/dL (ref 0.0–40.0)

## 2022-03-26 MED ORDER — LORAZEPAM 0.5 MG PO TABS: 0.2500 mg | ORAL_TABLET | Freq: Every day | ORAL | 1 refills | Status: DC | PRN

## 2022-03-26 MED ORDER — EFINACONAZOLE 10 % EX SOLN: 1.0000 [drp] | Freq: Every day | CUTANEOUS | Status: DC

## 2022-03-26 MED ORDER — ESCITALOPRAM OXALATE 10 MG PO TABS: 5.0000 mg | ORAL_TABLET | Freq: Every day | ORAL | 3 refills | Status: DC

## 2022-03-26 NOTE — Patient Instructions (Addendum)
A few things to remember from today's visit: ? ?Routine general medical examination at a health care facility ? ?Screening for lipid disorders - Plan: Lipid panel ? ?Encounter for HCV screening test for low risk patient - Plan: Hepatitis C antibody ? ?Alternating constipation and diarrhea - Plan: Celiac Panel 10 ? ?Onychomycosis ? ?If you need refills please call your pharmacy. ?Do not use My Chart to request refills or for acute issues that need immediate attention. ? Please be sure medication list is accurate. ?If a new problem present, please set up appointment sooner than planned today. ? ?Health Maintenance, Female ?Adopting a healthy lifestyle and getting preventive care are important in promoting health and wellness. Ask your health care provider about: ?The right schedule for you to have regular tests and exams. ?Things you can do on your own to prevent diseases and keep yourself healthy. ?What should I know about diet, weight, and exercise? ?Eat a healthy diet ? ?Eat a diet that includes plenty of vegetables, fruits, low-fat dairy products, and lean protein. ?Do not eat a lot of foods that are high in solid fats, added sugars, or sodium. ?Maintain a healthy weight ?Body mass index (BMI) is used to identify weight problems. It estimates body fat based on height and weight. Your health care provider can help determine your BMI and help you achieve or maintain a healthy weight. ?Get regular exercise ?Get regular exercise. This is one of the most important things you can do for your health. Most adults should: ?Exercise for at least 150 minutes each week. The exercise should increase your heart rate and make you sweat (moderate-intensity exercise). ?Do strengthening exercises at least twice a week. This is in addition to the moderate-intensity exercise. ?Spend less time sitting. Even light physical activity can be beneficial. ?Watch cholesterol and blood lipids ?Have your blood tested for lipids and cholesterol  at 42 years of age, then have this test every 5 years. ?Have your cholesterol levels checked more often if: ?Your lipid or cholesterol levels are high. ?You are older than 42 years of age. ?You are at high risk for heart disease. ?What should I know about cancer screening? ?Depending on your health history and family history, you may need to have cancer screening at various ages. This may include screening for: ?Breast cancer. ?Cervical cancer. ?Colorectal cancer. ?Skin cancer. ?Lung cancer. ?What should I know about heart disease, diabetes, and high blood pressure? ?Blood pressure and heart disease ?High blood pressure causes heart disease and increases the risk of stroke. This is more likely to develop in people who have high blood pressure readings or are overweight. ?Have your blood pressure checked: ?Every 3-5 years if you are 72-57 years of age. ?Every year if you are 32 years old or older. ?Diabetes ?Have regular diabetes screenings. This checks your fasting blood sugar level. Have the screening done: ?Once every three years after age 48 if you are at a normal weight and have a low risk for diabetes. ?More often and at a younger age if you are overweight or have a high risk for diabetes. ?What should I know about preventing infection? ?Hepatitis B ?If you have a higher risk for hepatitis B, you should be screened for this virus. Talk with your health care provider to find out if you are at risk for hepatitis B infection. ?Hepatitis C ?Testing is recommended for: ?Everyone born from [birth year] through 1966. ?Anyone with known risk factors for hepatitis C. ?Sexually transmitted infections (STIs) ?Get screened  for STIs, including gonorrhea and chlamydia, if: ?You are sexually active and are younger than 42 years of age. ?You are older than 42 years of age and your health care provider tells you that you are at risk for this type of infection. ?Your sexual activity has changed since you were last screened, and you are  at increased risk for chlamydia or gonorrhea. Ask your health care provider if you are at risk. ?Ask your health care provider about whether you are at high risk for HIV. Your health care provider may recommend a prescription medicine to help prevent HIV infection. If you choose to take medicine to prevent HIV, you should first get tested for HIV. You should then be tested every 3 months for as long as you are taking the medicine. ?Pregnancy ?If you are about to stop having your period (premenopausal) and you may become pregnant, seek counseling before you get pregnant. ?Take 400 to 800 micrograms (mcg) of folic acid every day if you become pregnant. ?Ask for birth control (contraception) if you want to prevent pregnancy. ?Osteoporosis and menopause ?Osteoporosis is a disease in which the bones lose minerals and strength with aging. This can result in bone fractures. If you are 3 years old or older, or if you are at risk for osteoporosis and fractures, ask your health care provider if you should: ?Be screened for bone loss. ?Take a calcium or vitamin D supplement to lower your risk of fractures. ?Be given hormone replacement therapy (HRT) to treat symptoms of menopause. ?Follow these instructions at home: ?Alcohol use ?Do not drink alcohol if: ?Your health care provider tells you not to drink. ?You are pregnant, may be pregnant, or are planning to become pregnant. ?If you drink alcohol: ?Limit how much you have to: ?0-1 drink a day. ?Know how much alcohol is in your drink. In the [place] one drink equals one 12 oz bottle of beer (355 mL), one 5 oz glass of wine (148 mL), or one 1? oz glass of hard liquor (44 mL). ?Lifestyle ?Do not use any products that contain nicotine or tobacco. These products include cigarettes, chewing tobacco, and vaping devices, such as e-cigarettes. If you need help quitting, ask your health care provider. ?Do not use street drugs. ?Do not share needles. ?Ask your health care provider for  help if you need support or information about quitting drugs. ?General instructions ?Schedule regular health, dental, and eye exams. ?Stay current with your vaccines. ?Tell your health care provider if: ?You often feel depressed. ?You have ever been abused or do not feel safe at home. ?Summary ?Adopting a healthy lifestyle and getting preventive care are important in promoting health and wellness. ?Follow your health care provider's instructions about healthy diet, exercising, and getting tested or screened for diseases. ?Follow your health care provider's instructions on monitoring your cholesterol and blood pressure. ?This information is not intended to replace advice given to you by your health care provider. Make sure you discuss any questions you have with your health care provider. ?Document Revised: 04/20/2021 Document Reviewed: 04/20/2021 ?Elsevier Patient Education ? [place]. ? ?

## 2022-03-26 NOTE — Assessment & Plan Note (Signed)
Problem has been stable. ?Continue Lexapro 10 mg 1/2 tablet daily and lorazepam 0.5 mg 1/2 to 1 tablet daily as needed.  30 tablets of lorazepam usually last a year, so I think it is appropriate to continue following annually, before if problem gets worse. ?

## 2022-03-29 LAB — HEPATITIS C ANTIBODY
Hepatitis C Ab: NONREACTIVE
SIGNAL TO CUT-OFF: 0.12 (ref <1.00)

## 2022-03-30 ENCOUNTER — Other Ambulatory Visit (HOSPITAL_BASED_OUTPATIENT_CLINIC_OR_DEPARTMENT_OTHER): Payer: Self-pay

## 2022-04-01 LAB — CELIAC PANEL 10
Deamidated Gliadin Abs, IgA: 69 U — ABNORMAL HIGH (ref 0–19)
Deamidated Gliadin Abs, IgG: 25 U — ABNORMAL HIGH (ref 0–19)
Endomysial IgA: POSITIVE — AB
Immunoglobulin A, (IgA) QN, Serum: 166 mg/dL (ref 87–352)
t-Transglutaminase (tTG) IgA: 5 U/mL — ABNORMAL HIGH (ref 0–3)
t-Transglutaminase (tTG) IgG: <2 U/mL (ref 0–5)

## 2022-04-01 LAB — HEPATITIS C ANTIBODY: Hep C Virus Ab: NONREACTIVE

## 2022-04-02 DIAGNOSIS — C50011 Malignant neoplasm of nipple and areola, right female breast: Secondary | ICD-10-CM | POA: Diagnosis not present

## 2022-04-02 DIAGNOSIS — M50121 Cervical disc disorder at C4-C5 level with radiculopathy: Secondary | ICD-10-CM | POA: Diagnosis not present

## 2022-04-02 DIAGNOSIS — Z170 Estrogen receptor positive status [ER+]: Secondary | ICD-10-CM | POA: Diagnosis not present

## 2022-04-02 DIAGNOSIS — M62838 Other muscle spasm: Secondary | ICD-10-CM | POA: Diagnosis not present

## 2022-04-02 DIAGNOSIS — M6281 Muscle weakness (generalized): Secondary | ICD-10-CM | POA: Diagnosis not present

## 2022-04-13 ENCOUNTER — Other Ambulatory Visit (HOSPITAL_BASED_OUTPATIENT_CLINIC_OR_DEPARTMENT_OTHER): Payer: Self-pay

## 2022-04-19 ENCOUNTER — Other Ambulatory Visit (HOSPITAL_BASED_OUTPATIENT_CLINIC_OR_DEPARTMENT_OTHER): Payer: Self-pay

## 2022-04-19 ENCOUNTER — Encounter: Payer: Self-pay | Admitting: Family Medicine

## 2022-04-19 ENCOUNTER — Other Ambulatory Visit (HOSPITAL_COMMUNITY): Payer: Self-pay

## 2022-04-19 MED ORDER — ESCITALOPRAM OXALATE 10 MG PO TABS
ORAL_TABLET | ORAL | 3 refills | Status: DC
Start: 2022-03-19 — End: 2023-07-20
  Filled 2022-04-19: qty 45, 90d supply, fill #0
  Filled 2022-07-11: qty 45, 90d supply, fill #1
  Filled 2022-10-10: qty 45, 90d supply, fill #2

## 2022-04-23 DIAGNOSIS — M50121 Cervical disc disorder at C4-C5 level with radiculopathy: Secondary | ICD-10-CM | POA: Diagnosis not present

## 2022-04-23 DIAGNOSIS — M6281 Muscle weakness (generalized): Secondary | ICD-10-CM | POA: Diagnosis not present

## 2022-04-23 DIAGNOSIS — M62838 Other muscle spasm: Secondary | ICD-10-CM | POA: Diagnosis not present

## 2022-04-25 ENCOUNTER — Other Ambulatory Visit: Payer: Self-pay | Admitting: Family Medicine

## 2022-04-25 DIAGNOSIS — Z8379 Family history of other diseases of the digestive system: Secondary | ICD-10-CM

## 2022-04-25 DIAGNOSIS — E639 Nutritional deficiency, unspecified: Secondary | ICD-10-CM

## 2022-04-27 DIAGNOSIS — Z170 Estrogen receptor positive status [ER+]: Secondary | ICD-10-CM | POA: Diagnosis not present

## 2022-04-27 DIAGNOSIS — C50011 Malignant neoplasm of nipple and areola, right female breast: Secondary | ICD-10-CM | POA: Diagnosis not present

## 2022-04-30 ENCOUNTER — Encounter: Payer: Self-pay | Admitting: Gastroenterology

## 2022-05-05 ENCOUNTER — Encounter: Payer: Self-pay | Admitting: Family Medicine

## 2022-05-05 ENCOUNTER — Telehealth (INDEPENDENT_AMBULATORY_CARE_PROVIDER_SITE_OTHER): Payer: 59 | Admitting: Family Medicine

## 2022-05-05 ENCOUNTER — Other Ambulatory Visit (HOSPITAL_BASED_OUTPATIENT_CLINIC_OR_DEPARTMENT_OTHER): Payer: Self-pay

## 2022-05-05 VITALS — Ht 61.0 in

## 2022-05-05 DIAGNOSIS — B029 Zoster without complications: Secondary | ICD-10-CM | POA: Diagnosis not present

## 2022-05-05 MED ORDER — LIDOCAINE 5 % EX PTCH
1.0000 | MEDICATED_PATCH | CUTANEOUS | 1 refills | Status: AC
Start: 2022-05-05 — End: 2022-07-04
  Filled 2022-05-05: qty 30, 30d supply, fill #0

## 2022-05-05 MED ORDER — VALACYCLOVIR HCL 1 G PO TABS
1000.0000 mg | ORAL_TABLET | Freq: Three times a day (TID) | ORAL | 0 refills | Status: AC
  Filled 2022-05-05: qty 30, 10d supply, fill #0

## 2022-05-05 MED ORDER — VALACYCLOVIR HCL 1 G PO TABS
1000.0000 mg | ORAL_TABLET | Freq: Three times a day (TID) | ORAL | 0 refills | Status: DC
Start: 2022-05-05 — End: 2022-05-05
  Filled 2022-05-05: qty 30, 10d supply, fill #0

## 2022-05-05 NOTE — Progress Notes (Addendum)
Virtual Visit via Video Note I connected with [name] on 05/05/22 by a video enabled telemedicine application and verified that I am speaking with the correct person using two identifiers.  Location patient: home Location provider:work office Persons participating in the virtual visit: patient, provider  I discussed the limitations of evaluation and management by telemedicine and the availability of in person appointments. The patient expressed understanding and agreed to proceed.  Chief Complaint  Patient presents with   possible shingles   HPI: [name] is a 41 yo female with hx of right breast cancer on ovarian suppression therapy and asthma c/o erythematous skin rash noted yesterday.  Problem getting worse. She has had achy moderate to severe pain on same are for the past 3 days. No hx of trauma. Negative for new medication, detergent, soap, or body product. No known insect bite or outdoor exposures to plants. No sick contact. No Hx of eczema or similar rash in the past.  OTC medication for this problem: None.  Negative for fever,chills,unusual fatigue,oral lesions/edema,cough, wheezing, dyspnea, abdominal pain, change in bowel habits, nausea, or vomiting.  ROS: See pertinent positives and negatives per HPI.  Past Medical History:  Diagnosis Date   Breast mass 07/26/2017   Right breast lump x 1 month    Eating disorder    Family history of cholangiocarcinoma    PONV (postoperative nausea and vomiting)    hypotensive passes out after epidurals   right breast ca    diagnosed 09/18/19   UTI (urinary tract infection)    Past Surgical History:  Procedure Laterality Date   APPENDECTOMY     AUGMENTATION MAMMAPLASTY Bilateral 2010   BREAST AUGMENTATION  2010   BREAST LUMPECTOMY WITH RADIOACTIVE SEED AND SENTINEL LYMPH NODE BIOPSY Right 10/18/2019   Procedure: RIGHT BREAST LUMPECTOMY WITH RADIOACTIVE SEED AND RIGHT AXILLARY SENTINEL LYMPH NODE BIOPSY;  Surgeon:  Rolm Bookbinder, MD;  Location: [facility];  Service: General;  Laterality: Right;   RE-EXCISION OF BREAST CANCER,SUPERIOR MARGINS Right 11/01/2019   Procedure: RE-EXCISION OF RIGHT BREAST CANCER MARGINS;  Surgeon: Rolm Bookbinder, MD;  Location: [facility];  Service: General;  Laterality: Right;   Family History  Problem Relation Age of Onset   Alcohol abuse Maternal Grandfather        bile duct cancer   Heart disease Maternal Grandfather    Cancer Maternal Grandfather 89       bile duct cancer   Hypertension Maternal Grandfather    Hypertension Paternal Grandfather    Heart disease Paternal Grandfather     Social History   Socioeconomic History   Marital status: Married    Spouse name: Ginette Bradway   Number of children: 2   Years of education: Not on file   Highest education level: Not on file  Occupational History   Not on file  Tobacco Use   Smoking status: Never   Smokeless tobacco: Never  Vaping Use   Vaping Use: Never used  Substance and Sexual Activity   Alcohol use: Yes    Comment: 1-2   Drug use: No   Sexual activity: Yes  Other Topics Concern   Not on file  Social History Narrative   Not on file   Social Determinants of Health   Financial Resource Strain: Not on file  Food Insecurity: Not on file  Transportation Needs: Not on file  Physical Activity: Not on file  Stress: Not on file  Social Connections: Not on file  Intimate  Partner Violence: Not on file   Current Outpatient Medications:    albuterol (VENTOLIN HFA) 108 (90 Base) MCG/ACT inhaler, Inhale 2 puffs into the lungs every 6 (six) hours as needed for wheezing or shortness of breath., Disp: 8 g, Rfl: 2   ascorbic acid (VITAMIN C) 500 MG tablet, Take 500 mg by mouth daily., Disp: , Rfl:    BIOTIN PO, Take by mouth., Disp: , Rfl:    cholecalciferol (VITAMIN D3) 25 MCG (1000 UNIT) tablet, Take 1,000 Units by mouth daily., Disp: , Rfl:    COVID-19 At Home Antigen Test (CARESTART  COVID-19 HOME TEST) KIT, Use as directed, Disp: 4 each, Rfl: 0   COVID-19 At Home Antigen Test (CARESTART COVID-19 HOME TEST) KIT, Use as directed per package instructions, Disp: 4 each, Rfl: 0   Cyanocobalamin (VITAMIN B-12 PO), Take by mouth., Disp: , Rfl:    Efinaconazole 10 % SOLN, Apply 1 drop topically at bedtime. A drop onto and end of a dry toenail,rub around nail and surrounding., Disp: 8 mL, Rfl: ML   escitalopram (LEXAPRO) 10 MG tablet, Take 0.5 tablet by mouth once daily., Disp: 45 tablet, Rfl: 3   fluticasone (FLOVENT HFA) 110 MCG/ACT inhaler, Inhale 2 puffs into the lungs in the morning and at bedtime., Disp: 1 each, Rfl: 3   ibuprofen (ADVIL) 600 MG tablet, Take 1 tablet (600 mg total) by mouth every 6 (six) hours., Disp: 30 tablet, Rfl: 0   letrozole (FEMARA) 2.5 MG tablet, Take 2.5 mg by mouth daily., Disp: , Rfl:    letrozole (FEMARA) 2.5 MG tablet, Take 1 tablet (2.5 mg total) by mouth once daily, Disp: 90 tablet, Rfl: 4   Leuprolide Acetate (LUPRON DEPOT, 4-MONTH, IM), Inject into the muscle., Disp: , Rfl:    LORazepam (ATIVAN) 0.5 MG tablet, Take 0.5-1 tablets (0.25-0.5 mg total) by mouth daily as needed for anxiety., Disp: 30 tablet, Rfl: 1   Multiple Vitamin (MULTIVITAMIN ADULT PO), Take by mouth., Disp: , Rfl:    oxybutynin (DITROPAN) 5 MG tablet, Take 1/2 tablets (2.5 mg total) by mouth 2 (two) times daily, Disp: 30 tablet, Rfl: 11   PROBIOTIC PRODUCT PO, Take 1 capsule by mouth daily. , Disp: , Rfl:    Spacer/Aero-Holding Chambers (AEROCHAMBER PLUS) inhaler, Use as instructed to use with inahaler., Disp: 1 each, Rfl: 1   triamcinolone (KENALOG) 0.025 % cream, Apply to the affected area twice daily prn., Disp: 80 g, Rfl: 0   valACYclovir (VALTREX) 1000 MG tablet, Take 1 tablet (1,000 mg total) by mouth 3 (three) times daily for 10 days., Disp: 30 tablet, Rfl: 0  EXAM:  VITALS per patient if applicable:Ht 5' 1"  (1.549 m)   BMI 20.08 kg/m   GENERAL: alert, oriented,  appears well and in no acute distress  HEENT: atraumatic, conjunctiva clear, no obvious abnormalities on inspection.  LUNGS: on inspection no signs of respiratory distress, breathing rate appears normal, no obvious gross SOB, gasping or wheezing  CV: no obvious cyanosis  MS: moves all visible extremities without noticeable abnormality  SKIN: Erythematous confluent papular and microvesicular rash trunk right-sided with dermatomal distribution. Extends from right under right breast to lateral aspect.  PSYCH/NEURO: pleasant and cooperative, no obvious depression or anxiety, speech and thought processing grossly intact  ASSESSMENT AND PLAN:  Discussed the following assessment and plan:  Herpes zoster without complication - Plan: valACYclovir (VALTREX) 1000 MG tablet, lidocaine (LIDODERM) 5 % We discussed diagnosis, prognosis, and treatment options. Valtrex 1 g 3 times  daily for 10 days recommended, instructed to start medication as soon as possible. Topical IcyHot or Aspercreme can be used for pain.  Sent a message after visit requesting lidocaine patches, Rx sent.  We discussed possible serious and likely etiologies, options for evaluation and workup, limitations of telemedicine visit vs in person visit, treatment, treatment risks and precautions. The patient was advised to call back or seek an in-person evaluation if the symptoms worsen or if the condition fails to improve as anticipated. I discussed the assessment and treatment plan with the patient. The patient was provided an opportunity to ask questions and all were answered. The patient agreed with the plan and demonstrated an understanding of the instructions.  Return if symptoms worsen or fail to improve.  Betty G. Martinique, MD  [facility]. [place] office.

## 2022-05-05 NOTE — Addendum Note (Signed)
Addended by: Martinique, Delainee Tramel G on: 05/05/2022 03:14 PM   Modules accepted: Orders

## 2022-05-14 ENCOUNTER — Other Ambulatory Visit (HOSPITAL_BASED_OUTPATIENT_CLINIC_OR_DEPARTMENT_OTHER): Payer: Self-pay

## 2022-05-14 ENCOUNTER — Ambulatory Visit (INDEPENDENT_AMBULATORY_CARE_PROVIDER_SITE_OTHER): Payer: 59 | Admitting: Gastroenterology

## 2022-05-14 ENCOUNTER — Encounter: Payer: Self-pay | Admitting: Gastroenterology

## 2022-05-14 VITALS — BP 116/60 | HR 69 | Ht 61.0 in | Wt 105.0 lb

## 2022-05-14 DIAGNOSIS — K625 Hemorrhage of anus and rectum: Secondary | ICD-10-CM | POA: Diagnosis not present

## 2022-05-14 DIAGNOSIS — K5909 Other constipation: Secondary | ICD-10-CM

## 2022-05-14 DIAGNOSIS — K649 Unspecified hemorrhoids: Secondary | ICD-10-CM

## 2022-05-14 DIAGNOSIS — R894 Abnormal immunological findings in specimens from other organs, systems and tissues: Secondary | ICD-10-CM

## 2022-05-14 MED ORDER — NA SULFATE-K SULFATE-MG SULF 17.5-3.13-1.6 GM/177ML PO SOLN
1.0000 | Freq: Once | ORAL | 0 refills | Status: AC
  Filled 2022-05-14 – 2022-07-01 (×2): qty 354, 1d supply, fill #0

## 2022-05-14 NOTE — Patient Instructions (Addendum)
If you are age 42 or older, your body mass index should be between 23-30. Your Body mass index is 19.84 kg/m. If this is out of the aforementioned range listed, please consider follow up with your Primary Care Provider.  If you are age 22 or younger, your body mass index should be between 19-25. Your Body mass index is 19.84 kg/m. If this is out of the aformentioned range listed, please consider follow up with your Primary Care Provider.   ________________________________________________________  The [facility] providers would like to encourage you to use [facility] to communicate with providers for non-urgent requests or questions.  Due to long hold times on the telephone, sending your provider a message by [facility] may be a faster and more efficient way to get a response.  Please allow 48 business hours for a response.  Please remember that this is for non-urgent requests.  _______________________________________________________  Dennis Bast have been scheduled for an endoscopy and colonoscopy. Please follow the written instructions given to you at your visit today. Please pick up your prep supplies at the pharmacy within the next 1-3 days. If you use inhalers (even only as needed), please bring them with you on the day of your procedure.   Please maintain a regular diet until after the procedure.  Please have your PCP fax [place] the results of your upcoming lab work (CBC, IBC/Ferritin):  [phone]. Thank you  Thank you for entrusting me with your care and for choosing [facility], Dr. Byers Cellar

## 2022-05-14 NOTE — Progress Notes (Signed)
HPI :  41 year old female with a history of breast cancer in remission, constipation, rectal bleeding, referred here by Dr. Betty Martinique for positive celiac panel, rectal bleeding, constipation.  She reports for years she had some chronic loose stools and bloating.  However in recent years she has actually been much more so constipated.  Uses magnesium supplements and smooth move herbal tea, and fiber supplements as needed.  If she takes these routinely she can have a bowel movement every day.  If she does not take her regimen she can have more constipated stools.  She states since her pregnancy with her son, roughly 9 years ago, she has had periodic rectal bleeding with most bowel movements.  Denies any significant amount of blood in the stool or toilet but usually a small amount on the toilet paper.  She states she was told she had some vitamin deficiencies in the past despite taking vitamin supplements.  She reports her mother, sister, brother all have confirmed diagnosis of celiac disease.  She had a celiac panel drawn which is as outlined below.  Weakly positive TTG IgA and cleated and IgG with a strongly positive and likely an IgA, and positive endomysial antibody.  She does endorse being lactose intolerant.  While she is not on a gluten-free diet, she generally avoids gluten and most of her day-to-day eating anyway.  She denies any family history of colon cancer.  Her grandfather had bile duct cancer in his 72s.  She does have a history of Raynaud's.  She has never had any rashes such as dermatitis herpetiformis etc.  She does have a history of osteopenia.  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     Antigliadin Abs, IgA 0 - 19 units 69 High    Comment:                    Negative                   0 - 19                     Weak Positive             20 - 30                     Moderate to Strong Positive   >30   Gliadin IgG 0 - 19 units 25 High    Comment:                    Negative                    0 - 19                     Weak Positive             20 - 30                     Moderate to Strong Positive   >30   Transglutaminase IgA 0 - 3 U/mL 5 High    Comment:                               Negative        0 -  3  Weak Positive   4 - 10                                Positive           >10   Tissue Transglutaminase (tTG) has been identified   as the endomysial antigen.  Studies have demonstr-   ated that endomysial IgA antibodies have over 99%   specificity for gluten sensitive enteropathy.   Tissue Transglut Ab 0 - 5 U/mL <2   Comment:                               Negative        0 - 5                                Weak Positive   6 - 9                                Positive           >9   Endomysial IgA Negative Positive Abnormal    IgA/Immunoglobulin A, Serum 87 - 352 mg/dL 166        Past Medical History:  Diagnosis Date   Breast mass 07/26/2017   Right breast lump x 1 month    Cancer (HCC)    Eating disorder    Family history of cholangiocarcinoma    PONV (postoperative nausea and vomiting)    hypotensive passes out after epidurals   right breast ca    diagnosed 09/18/19   UTI (urinary tract infection)      Past Surgical History:  Procedure Laterality Date   APPENDECTOMY     AUGMENTATION MAMMAPLASTY Bilateral 2010   BREAST AUGMENTATION  2010   BREAST LUMPECTOMY WITH RADIOACTIVE SEED AND SENTINEL LYMPH NODE BIOPSY Right 10/18/2019   Procedure: RIGHT BREAST LUMPECTOMY WITH RADIOACTIVE SEED AND RIGHT AXILLARY SENTINEL LYMPH NODE BIOPSY;  Surgeon: Rolm Bookbinder, MD;  Location: [place];  Service: General;  Laterality: Right;   RE-EXCISION OF BREAST CANCER,SUPERIOR MARGINS Right 11/01/2019   Procedure: RE-EXCISION OF RIGHT BREAST CANCER MARGINS;  Surgeon: Rolm Bookbinder, MD;  Location: [place];  Service: General;  Laterality: Right;   Family History  Problem Relation Age of Onset   Alcohol  abuse Maternal Grandfather        bile duct cancer   Heart disease Maternal Grandfather    Cancer Maternal Grandfather 89       bile duct cancer   Hypertension Maternal Grandfather    Hypertension Paternal Grandfather    Heart disease Paternal Grandfather    Colon cancer Neg Hx    Stomach cancer Neg Hx    Esophageal cancer Neg Hx    Social History   Tobacco Use   Smoking status: Never   Smokeless tobacco: Never  Vaping Use   Vaping Use: Never used  Substance Use Topics   Alcohol use: Yes    Comment: 1 glass of wine a wk   Drug use: No   Current Outpatient Medications  Medication Sig Dispense Refill   albuterol (VENTOLIN HFA) 108 (90 Base) MCG/ACT inhaler Inhale 2 puffs into the lungs every 6 (six) hours as needed for wheezing or shortness of breath.  8 g 2   ascorbic acid (VITAMIN C) 500 MG tablet Take 500 mg by mouth daily.     BIOTIN PO Take by mouth.     cholecalciferol (VITAMIN D3) 25 MCG (1000 UNIT) tablet Take 1,000 Units by mouth daily.     COVID-19 At Home Antigen Test (CARESTART COVID-19 HOME TEST) KIT Use as directed 4 each 0   COVID-19 At Home Antigen Test (CARESTART COVID-19 HOME TEST) KIT Use as directed per package instructions 4 each 0   Cyanocobalamin (VITAMIN B-12 PO) Take by mouth.     Efinaconazole 10 % SOLN Apply 1 drop topically at bedtime. A drop onto and end of a dry toenail,rub around nail and surrounding. 8 mL ML   escitalopram (LEXAPRO) 10 MG tablet Take 0.5 tablet by mouth once daily. 45 tablet 3   fluticasone (FLOVENT HFA) 110 MCG/ACT inhaler Inhale 2 puffs into the lungs in the morning and at bedtime. 1 each 3   ibuprofen (ADVIL) 600 MG tablet Take 1 tablet (600 mg total) by mouth every 6 (six) hours. 30 tablet 0   letrozole (FEMARA) 2.5 MG tablet Take 2.5 mg by mouth daily.     letrozole (FEMARA) 2.5 MG tablet Take 1 tablet (2.5 mg total) by mouth once daily 90 tablet 4   Leuprolide Acetate (LUPRON DEPOT, 56-MONTH, IM) Inject into the muscle.      lidocaine (LIDODERM) 5 % Place 1 patch onto the skin daily. Remove & Discard patch within 12 hours or as directed by MD 30 patch 1   LORazepam (ATIVAN) 0.5 MG tablet Take 0.5-1 tablets (0.25-0.5 mg total) by mouth daily as needed for anxiety. 30 tablet 1   Multiple Vitamin (MULTIVITAMIN ADULT PO) Take by mouth.     oxybutynin (DITROPAN) 5 MG tablet Take 1/2 tablets (2.5 mg total) by mouth 2 (two) times daily 30 tablet 11   PROBIOTIC PRODUCT PO Take 1 capsule by mouth daily.      Spacer/Aero-Holding Chambers (AEROCHAMBER PLUS) inhaler Use as instructed to use with inahaler. 1 each 1   triamcinolone (KENALOG) 0.025 % cream Apply to the affected area twice daily prn. 80 g 0   valACYclovir (VALTREX) 1000 MG tablet Take 1 tablet (1,000 mg total) by mouth 3 (three) times daily for 10 days. 30 tablet 0   No current facility-administered medications for this visit.   Allergies  Allergen Reactions   Sulfa Antibiotics Hives   Sulfonylureas      Review of Systems: All systems reviewed and negative except where noted in HPI.   Labs in care everywhere from 2022 show a normal hemoglobin with normal MCV.  Vitamin D level of 65 from this past January.  Normal thyroid testing within the past year.  Physical Exam: BP 116/60   Pulse 69   Ht 5' 1"  (1.549 m)   Wt 105 lb (47.6 kg)   BMI 19.84 kg/m  Constitutional: Pleasant,well-developed, female in no acute distress. HEENT: Normocephalic and atraumatic. Conjunctivae are normal. No scleral icterus. Neck supple.  Cardiovascular: Normal rate, regular rhythm.  Pulmonary/chest: Effort normal and breath sounds normal. No wheezing, rales or rhonchi. Abdominal: Soft, nondistended, nontender.  There are no masses palpable.  DRE / Anoscopy - small hemorrhoids, no fissure or mass - Quintin Alto CMA as standby Extremities: no edema Lymphadenopathy: No cervical adenopathy noted. Neurological: Alert and oriented to person place and time. Skin: Skin is warm and  dry. No rashes noted. Psychiatric: Normal mood and affect. Behavior is normal.  ASSESSMENT AND PLAN: 41 year old female here for new patient assessment of the following:  Positive celiac antibody panel Rectal bleeding Hemorrhoids Constipation  Patient with a very strong family history of celiac disease with a positive celiac antibody panel.  She previously had loose stools but more so constipated at baseline in recent years.  Reported with a history of vitamin deficiencies although I do not have all her labs to review today.  We discussed that with her family history and panel positive that it is quite possible she does have celiac disease.  I recommend an EGD to further evaluate and clarify if she has this or not.  I discussed risk benefits of EGD and anesthesia and she wants to proceed.  I recommend she stay on her regular diet until the exam, she should not go on a gluten-free diet until we have a confirmed diagnosis.  We discussed celiac disease for a little bit and what it entails, long-term risks related to this.  I would like to check a CBC and iron panel as I have not seen that recently, make sure no deficiency.  She does have osteopenia that is being managed.  Regarding her frequent rectal bleeding, I do not see any clear fissure on exam and hemorrhoids are rather small on anoscopy.  Given the chronicity of symptoms that is reassuring however I do not see any obvious stigmata on her anoscopy today, offered her a colonoscopy to make sure nothing else concerning causing this.  She agreed to colonoscopy to be done at the same time as EGD after discussion of it.  She should continue her bowel regimen daily to prevent constipation.  She is happy with the regimen if she can remember to take it every day.  She can try using some Calmol 4 suppositories as needed for hemorrhoids and see if that helps in the interim.  She agreed  Plan: - schedule EGD and colonoscopy in the [place] - stay on regular diet  until then - lab for CBC and iron studies - continue bowel regimen - Calmol4 suppositories PRN  Jolly Mango, MD [facility]  CC: Martinique, Betty G, MD

## 2022-05-20 DIAGNOSIS — M9904 Segmental and somatic dysfunction of sacral region: Secondary | ICD-10-CM | POA: Diagnosis not present

## 2022-05-20 DIAGNOSIS — M9903 Segmental and somatic dysfunction of lumbar region: Secondary | ICD-10-CM | POA: Diagnosis not present

## 2022-05-20 DIAGNOSIS — M9902 Segmental and somatic dysfunction of thoracic region: Secondary | ICD-10-CM | POA: Diagnosis not present

## 2022-05-20 DIAGNOSIS — M9901 Segmental and somatic dysfunction of cervical region: Secondary | ICD-10-CM | POA: Diagnosis not present

## 2022-05-25 ENCOUNTER — Other Ambulatory Visit (HOSPITAL_BASED_OUTPATIENT_CLINIC_OR_DEPARTMENT_OTHER): Payer: Self-pay

## 2022-05-25 DIAGNOSIS — M9903 Segmental and somatic dysfunction of lumbar region: Secondary | ICD-10-CM | POA: Diagnosis not present

## 2022-05-25 DIAGNOSIS — M9901 Segmental and somatic dysfunction of cervical region: Secondary | ICD-10-CM | POA: Diagnosis not present

## 2022-05-25 DIAGNOSIS — M9904 Segmental and somatic dysfunction of sacral region: Secondary | ICD-10-CM | POA: Diagnosis not present

## 2022-05-25 DIAGNOSIS — M9902 Segmental and somatic dysfunction of thoracic region: Secondary | ICD-10-CM | POA: Diagnosis not present

## 2022-05-26 ENCOUNTER — Other Ambulatory Visit (HOSPITAL_BASED_OUTPATIENT_CLINIC_OR_DEPARTMENT_OTHER): Payer: Self-pay

## 2022-05-27 DIAGNOSIS — M9904 Segmental and somatic dysfunction of sacral region: Secondary | ICD-10-CM | POA: Diagnosis not present

## 2022-05-27 DIAGNOSIS — M9903 Segmental and somatic dysfunction of lumbar region: Secondary | ICD-10-CM | POA: Diagnosis not present

## 2022-05-27 DIAGNOSIS — M9901 Segmental and somatic dysfunction of cervical region: Secondary | ICD-10-CM | POA: Diagnosis not present

## 2022-05-27 DIAGNOSIS — C50011 Malignant neoplasm of nipple and areola, right female breast: Secondary | ICD-10-CM | POA: Diagnosis not present

## 2022-05-27 DIAGNOSIS — M9902 Segmental and somatic dysfunction of thoracic region: Secondary | ICD-10-CM | POA: Diagnosis not present

## 2022-05-27 DIAGNOSIS — Z170 Estrogen receptor positive status [ER+]: Secondary | ICD-10-CM | POA: Diagnosis not present

## 2022-06-01 DIAGNOSIS — M9902 Segmental and somatic dysfunction of thoracic region: Secondary | ICD-10-CM | POA: Diagnosis not present

## 2022-06-01 DIAGNOSIS — M9901 Segmental and somatic dysfunction of cervical region: Secondary | ICD-10-CM | POA: Diagnosis not present

## 2022-06-01 DIAGNOSIS — M9904 Segmental and somatic dysfunction of sacral region: Secondary | ICD-10-CM | POA: Diagnosis not present

## 2022-06-01 DIAGNOSIS — M9903 Segmental and somatic dysfunction of lumbar region: Secondary | ICD-10-CM | POA: Diagnosis not present

## 2022-06-02 ENCOUNTER — Other Ambulatory Visit (HOSPITAL_BASED_OUTPATIENT_CLINIC_OR_DEPARTMENT_OTHER): Payer: Self-pay

## 2022-06-03 DIAGNOSIS — M9904 Segmental and somatic dysfunction of sacral region: Secondary | ICD-10-CM | POA: Diagnosis not present

## 2022-06-03 DIAGNOSIS — M9902 Segmental and somatic dysfunction of thoracic region: Secondary | ICD-10-CM | POA: Diagnosis not present

## 2022-06-03 DIAGNOSIS — M9903 Segmental and somatic dysfunction of lumbar region: Secondary | ICD-10-CM | POA: Diagnosis not present

## 2022-06-03 DIAGNOSIS — M9901 Segmental and somatic dysfunction of cervical region: Secondary | ICD-10-CM | POA: Diagnosis not present

## 2022-06-16 DIAGNOSIS — M9903 Segmental and somatic dysfunction of lumbar region: Secondary | ICD-10-CM | POA: Diagnosis not present

## 2022-06-16 DIAGNOSIS — M9904 Segmental and somatic dysfunction of sacral region: Secondary | ICD-10-CM | POA: Diagnosis not present

## 2022-06-16 DIAGNOSIS — M9901 Segmental and somatic dysfunction of cervical region: Secondary | ICD-10-CM | POA: Diagnosis not present

## 2022-06-16 DIAGNOSIS — M9902 Segmental and somatic dysfunction of thoracic region: Secondary | ICD-10-CM | POA: Diagnosis not present

## 2022-06-17 DIAGNOSIS — M9901 Segmental and somatic dysfunction of cervical region: Secondary | ICD-10-CM | POA: Diagnosis not present

## 2022-06-17 DIAGNOSIS — M9903 Segmental and somatic dysfunction of lumbar region: Secondary | ICD-10-CM | POA: Diagnosis not present

## 2022-06-17 DIAGNOSIS — M9904 Segmental and somatic dysfunction of sacral region: Secondary | ICD-10-CM | POA: Diagnosis not present

## 2022-06-17 DIAGNOSIS — M9902 Segmental and somatic dysfunction of thoracic region: Secondary | ICD-10-CM | POA: Diagnosis not present

## 2022-06-22 DIAGNOSIS — M9901 Segmental and somatic dysfunction of cervical region: Secondary | ICD-10-CM | POA: Diagnosis not present

## 2022-06-22 DIAGNOSIS — M9904 Segmental and somatic dysfunction of sacral region: Secondary | ICD-10-CM | POA: Diagnosis not present

## 2022-06-22 DIAGNOSIS — M9903 Segmental and somatic dysfunction of lumbar region: Secondary | ICD-10-CM | POA: Diagnosis not present

## 2022-06-22 DIAGNOSIS — M9902 Segmental and somatic dysfunction of thoracic region: Secondary | ICD-10-CM | POA: Diagnosis not present

## 2022-06-24 DIAGNOSIS — M9901 Segmental and somatic dysfunction of cervical region: Secondary | ICD-10-CM | POA: Diagnosis not present

## 2022-06-24 DIAGNOSIS — M9902 Segmental and somatic dysfunction of thoracic region: Secondary | ICD-10-CM | POA: Diagnosis not present

## 2022-06-24 DIAGNOSIS — M9903 Segmental and somatic dysfunction of lumbar region: Secondary | ICD-10-CM | POA: Diagnosis not present

## 2022-06-24 DIAGNOSIS — M9904 Segmental and somatic dysfunction of sacral region: Secondary | ICD-10-CM | POA: Diagnosis not present

## 2022-06-29 ENCOUNTER — Encounter (HOSPITAL_BASED_OUTPATIENT_CLINIC_OR_DEPARTMENT_OTHER): Payer: Self-pay | Admitting: Pharmacist

## 2022-06-29 ENCOUNTER — Other Ambulatory Visit (HOSPITAL_BASED_OUTPATIENT_CLINIC_OR_DEPARTMENT_OTHER): Payer: Self-pay

## 2022-06-29 DIAGNOSIS — M9901 Segmental and somatic dysfunction of cervical region: Secondary | ICD-10-CM | POA: Diagnosis not present

## 2022-06-29 DIAGNOSIS — M9902 Segmental and somatic dysfunction of thoracic region: Secondary | ICD-10-CM | POA: Diagnosis not present

## 2022-06-29 DIAGNOSIS — M85831 Other specified disorders of bone density and structure, right forearm: Secondary | ICD-10-CM | POA: Diagnosis not present

## 2022-06-29 DIAGNOSIS — M549 Dorsalgia, unspecified: Secondary | ICD-10-CM | POA: Diagnosis not present

## 2022-06-29 DIAGNOSIS — C50011 Malignant neoplasm of nipple and areola, right female breast: Secondary | ICD-10-CM | POA: Diagnosis not present

## 2022-06-29 DIAGNOSIS — R748 Abnormal levels of other serum enzymes: Secondary | ICD-10-CM | POA: Diagnosis not present

## 2022-06-29 DIAGNOSIS — Z9011 Acquired absence of right breast and nipple: Secondary | ICD-10-CM | POA: Diagnosis not present

## 2022-06-29 DIAGNOSIS — R232 Flushing: Secondary | ICD-10-CM | POA: Diagnosis not present

## 2022-06-29 DIAGNOSIS — Z79811 Long term (current) use of aromatase inhibitors: Secondary | ICD-10-CM | POA: Diagnosis not present

## 2022-06-29 DIAGNOSIS — Z8619 Personal history of other infectious and parasitic diseases: Secondary | ICD-10-CM | POA: Diagnosis not present

## 2022-06-29 DIAGNOSIS — Z79899 Other long term (current) drug therapy: Secondary | ICD-10-CM | POA: Diagnosis not present

## 2022-06-29 DIAGNOSIS — M9903 Segmental and somatic dysfunction of lumbar region: Secondary | ICD-10-CM | POA: Diagnosis not present

## 2022-06-29 DIAGNOSIS — M9904 Segmental and somatic dysfunction of sacral region: Secondary | ICD-10-CM | POA: Diagnosis not present

## 2022-06-29 DIAGNOSIS — Z170 Estrogen receptor positive status [ER+]: Secondary | ICD-10-CM | POA: Diagnosis not present

## 2022-06-29 MED ORDER — EXEMESTANE 25 MG PO TABS
ORAL_TABLET | ORAL | 2 refills | Status: DC
  Filled 2022-06-29: qty 30, 30d supply, fill #0

## 2022-06-30 ENCOUNTER — Other Ambulatory Visit (HOSPITAL_BASED_OUTPATIENT_CLINIC_OR_DEPARTMENT_OTHER): Payer: Self-pay

## 2022-07-01 ENCOUNTER — Other Ambulatory Visit (HOSPITAL_BASED_OUTPATIENT_CLINIC_OR_DEPARTMENT_OTHER): Payer: Self-pay

## 2022-07-01 ENCOUNTER — Encounter: Payer: Self-pay | Admitting: Gastroenterology

## 2022-07-01 DIAGNOSIS — M9902 Segmental and somatic dysfunction of thoracic region: Secondary | ICD-10-CM | POA: Diagnosis not present

## 2022-07-01 DIAGNOSIS — M9901 Segmental and somatic dysfunction of cervical region: Secondary | ICD-10-CM | POA: Diagnosis not present

## 2022-07-01 DIAGNOSIS — M9903 Segmental and somatic dysfunction of lumbar region: Secondary | ICD-10-CM | POA: Diagnosis not present

## 2022-07-01 DIAGNOSIS — M9904 Segmental and somatic dysfunction of sacral region: Secondary | ICD-10-CM | POA: Diagnosis not present

## 2022-07-04 ENCOUNTER — Encounter: Payer: Self-pay | Admitting: Certified Registered Nurse Anesthetist

## 2022-07-06 DIAGNOSIS — M9901 Segmental and somatic dysfunction of cervical region: Secondary | ICD-10-CM | POA: Diagnosis not present

## 2022-07-06 DIAGNOSIS — M9903 Segmental and somatic dysfunction of lumbar region: Secondary | ICD-10-CM | POA: Diagnosis not present

## 2022-07-06 DIAGNOSIS — M9904 Segmental and somatic dysfunction of sacral region: Secondary | ICD-10-CM | POA: Diagnosis not present

## 2022-07-06 DIAGNOSIS — M9902 Segmental and somatic dysfunction of thoracic region: Secondary | ICD-10-CM | POA: Diagnosis not present

## 2022-07-08 ENCOUNTER — Ambulatory Visit (AMBULATORY_SURGERY_CENTER): Payer: 59 | Admitting: Gastroenterology

## 2022-07-08 ENCOUNTER — Encounter: Payer: Self-pay | Admitting: Gastroenterology

## 2022-07-08 VITALS — BP 114/67 | HR 60 | Temp 98.7°F | Resp 12 | Ht 61.0 in | Wt 105.0 lb

## 2022-07-08 DIAGNOSIS — K648 Other hemorrhoids: Secondary | ICD-10-CM | POA: Diagnosis not present

## 2022-07-08 DIAGNOSIS — R894 Abnormal immunological findings in specimens from other organs, systems and tissues: Secondary | ICD-10-CM | POA: Diagnosis not present

## 2022-07-08 DIAGNOSIS — K6389 Other specified diseases of intestine: Secondary | ICD-10-CM | POA: Diagnosis not present

## 2022-07-08 DIAGNOSIS — D123 Benign neoplasm of transverse colon: Secondary | ICD-10-CM

## 2022-07-08 DIAGNOSIS — K900 Celiac disease: Secondary | ICD-10-CM | POA: Diagnosis not present

## 2022-07-08 DIAGNOSIS — K625 Hemorrhage of anus and rectum: Secondary | ICD-10-CM | POA: Diagnosis not present

## 2022-07-08 DIAGNOSIS — D122 Benign neoplasm of ascending colon: Secondary | ICD-10-CM | POA: Diagnosis not present

## 2022-07-08 DIAGNOSIS — K635 Polyp of colon: Secondary | ICD-10-CM

## 2022-07-08 MED ORDER — SODIUM CHLORIDE 0.9 % IV SOLN: 500.0000 mL | Freq: Once | INTRAVENOUS | Status: DC

## 2022-07-08 NOTE — Patient Instructions (Addendum)
HANDOUTS ON HEMORRHOIDS AND POLYPS GIVEN.   TRIAL OF [place] DAILY FOR HEMORRHOIDS AND CALMOL4 SUPPOSITORIES AS NEEDED OVER THE COUNTER.   YOU HAD AN ENDOSCOPIC PROCEDURE TODAY AT [facility]:   Refer to the procedure report that was given to you for any specific questions about what was found during the examination.  If the procedure report does not answer your questions, please call your gastroenterologist to clarify.  If you requested that your care partner not be given the details of your procedure findings, then the procedure report has been included in a sealed envelope for you to review at your convenience later.  YOU SHOULD EXPECT: Some feelings of bloating in the abdomen. Passage of more gas than usual.  Walking can help get rid of the air that was put into your GI tract during the procedure and reduce the bloating. If you had a lower endoscopy (such as a colonoscopy or flexible sigmoidoscopy) you may notice spotting of blood in your stool or on the toilet paper. If you underwent a bowel prep for your procedure, you may not have a normal bowel movement for a few days.  Please Note:  You might notice some irritation and congestion in your nose or some drainage.  This is from the oxygen used during your procedure.  There is no need for concern and it should clear up in a day or so.  SYMPTOMS TO REPORT IMMEDIATELY:  Following lower endoscopy (colonoscopy or flexible sigmoidoscopy):  Excessive amounts of blood in the stool  Significant tenderness or worsening of abdominal pains  Swelling of the abdomen that is new, acute  Fever of 100F or higher  Following upper endoscopy (EGD)  Vomiting of blood or coffee ground material  New chest pain or pain under the shoulder blades  Painful or persistently difficult swallowing  New shortness of breath  Fever of 100F or higher  Black, tarry-looking stools  For urgent or emergent issues, a gastroenterologist can be reached at  any hour by calling [phone]. Do not use MyChart messaging for urgent concerns.    DIET:  We do recommend a small meal at first, but then you may proceed to your regular diet.  Drink plenty of fluids but you should avoid alcoholic beverages for 24 hours.  ACTIVITY:  You should plan to take it easy for the rest of today and you should NOT DRIVE or use heavy machinery until tomorrow (because of the sedation medicines used during the test).    FOLLOW UP: Our staff will call the number listed on your records the next business day following your procedure.  We will call around 7:15- 8:00 am to check on you and address any questions or concerns that you may have regarding the information given to you following your procedure. If we do not reach you, we will leave a message.  If you develop any symptoms (ie: fever, flu-like symptoms, shortness of breath, cough etc.) before then, please call [phone].  If you test positive for Covid 19 in the 2 weeks post procedure, please call and report this information to [place].    If any biopsies were taken you will be contacted by phone or by letter within the next 1-3 weeks.  Please call us at [phone] if you have not heard about the biopsies in 3 weeks.    SIGNATURES/CONFIDENTIALITY: You and/or your care partner have signed paperwork which will be entered into your electronic medical record.  These signatures attest to  the fact that that the information above on your After Visit Summary has been reviewed and is understood.  Full responsibility of the confidentiality of this discharge information lies with you and/or your care-partner.

## 2022-07-08 NOTE — Progress Notes (Signed)
[facility] History and Physical   Primary Care Physician:  Martinique, Betty G, MD   Reason for Procedure:   Abnormal celiac panel, rectal bleeding  Plan:    EGD and colonoscopy     HPI: Tabitha Brown is a 41 y.o. female  here for EGD to evaluate for possible celiac disease and colonoscopy for rectal bleeding history. Strong FH of celiac disease and positive celiac AB panel. No family history of colon cancer known. Otherwise feels well without any cardiopulmonary symptoms. I have discussed risks / benefits and she wants to proceed.   Past Medical History:  Diagnosis Date   Breast mass 07/26/2017   Right breast lump x 1 month    Cancer ([place])    Eating disorder    Family history of cholangiocarcinoma    PONV (postoperative nausea and vomiting)    hypotensive passes out after epidurals   right breast ca    diagnosed 09/18/19   UTI (urinary tract infection)     Past Surgical History:  Procedure Laterality Date   APPENDECTOMY     AUGMENTATION MAMMAPLASTY Bilateral 2010   BREAST AUGMENTATION  2010   BREAST LUMPECTOMY WITH RADIOACTIVE SEED AND SENTINEL LYMPH NODE BIOPSY Right 10/18/2019   Procedure: RIGHT BREAST LUMPECTOMY WITH RADIOACTIVE SEED AND RIGHT AXILLARY SENTINEL LYMPH NODE BIOPSY;  Surgeon: Rolm Bookbinder, MD;  Location: [place];  Service: General;  Laterality: Right;   RE-EXCISION OF BREAST CANCER,SUPERIOR MARGINS Right 11/01/2019   Procedure: RE-EXCISION OF RIGHT BREAST CANCER MARGINS;  Surgeon: Rolm Bookbinder, MD;  Location: [place];  Service: General;  Laterality: Right;    Prior to Admission medications   Medication Sig Start Date End Date Taking? Authorizing Provider  escitalopram (LEXAPRO) 10 MG tablet Take 0.5 tablet by mouth once daily. 03/19/22  Yes   exemestane (AROMASIN) 25 MG tablet Take 1 tablet (25 mg total) by mouth once daily for 90 days 06/29/22  Yes   ibuprofen (ADVIL) 600 MG tablet Take 1 tablet (600 mg total) by  mouth every 6 (six) hours. 06/08/20  Yes Paula Compton, MD  albuterol (VENTOLIN HFA) 108 (90 Base) MCG/ACT inhaler Inhale 2 puffs into the lungs every 6 (six) hours as needed for wheezing or shortness of breath. 11/14/20   Martinique, Betty G, MD  ascorbic acid (VITAMIN C) 500 MG tablet Take 500 mg by mouth daily.    [provider]  BIOTIN PO Take by mouth.    [provider]  cholecalciferol (VITAMIN D3) 25 MCG (1000 UNIT) tablet Take 1,000 Units by mouth daily.    [provider]  COVID-19 At Home Antigen Test [facility] COVID-19 HOME TEST) KIT Use as directed 09/01/21   Jefm Bryant, [facility]  COVID-19 At Home Antigen Test [facility] COVID-19 HOME TEST) KIT Use as directed per package instructions 09/18/21   Margie Ege, [facility]  Cyanocobalamin (VITAMIN B-12 PO) Take by mouth.    [provider]  Efinaconazole 10 % SOLN Apply 1 drop topically at bedtime. A drop onto and end of a dry toenail,rub around nail and surrounding. 03/26/22   Martinique, Betty G, MD  fluticasone (FLOVENT HFA) 110 MCG/ACT inhaler Inhale 2 puffs into the lungs in the morning and at bedtime. 11/14/20   Martinique, Betty G, MD  letrozole [facility]) 2.5 MG tablet Take 2.5 mg by mouth daily. Patient not taking: Reported on 07/08/2022 09/09/20   [provider]  letrozole (FEMARA) 2.5 MG tablet Take 1 tablet (2.5 mg total) by mouth  once daily Patient not taking: Reported on 07/08/2022 08/15/21     Leuprolide Acetate (LUPRON DEPOT, 557-MONTH, IM) Inject into the muscle.    [provider]  LORazepam (ATIVAN) 0.5 MG tablet Take 0.5-1 tablets (0.25-0.5 mg total) by mouth daily as needed for anxiety. 03/26/22   Martinique, Betty G, MD  Multiple Vitamin (MULTIVITAMIN ADULT PO) Take by mouth.    [provider]  oxybutynin (DITROPAN) 5 MG tablet Take 1/2 tablets (2.5 mg total) by mouth 2 (two) times daily 12/25/21     PROBIOTIC PRODUCT PO Take 1 capsule by mouth daily.     [provider]   Spacer/Aero-Holding Chambers (AEROCHAMBER PLUS) inhaler Use as instructed to use with inahaler. 11/14/20   Martinique, Betty G, MD  triamcinolone (KENALOG) 0.025 % cream Apply to the affected area twice daily prn. 09/09/21       Current Outpatient Medications  Medication Sig Dispense Refill   escitalopram (LEXAPRO) 10 MG tablet Take 0.5 tablet by mouth once daily. 45 tablet 3   exemestane (AROMASIN) 25 MG tablet Take 1 tablet (25 mg total) by mouth once daily for 90 days 30 tablet 2   ibuprofen (ADVIL) 600 MG tablet Take 1 tablet (600 mg total) by mouth every 6 (six) hours. 30 tablet 0   albuterol (VENTOLIN HFA) 108 (90 Base) MCG/ACT inhaler Inhale 2 puffs into the lungs every 6 (six) hours as needed for wheezing or shortness of breath. 8 g 2   ascorbic acid (VITAMIN C) 500 MG tablet Take 500 mg by mouth daily.     BIOTIN PO Take by mouth.     cholecalciferol (VITAMIN D3) 25 MCG (1000 UNIT) tablet Take 1,000 Units by mouth daily.     COVID-19 At Home Antigen Test (CARESTART COVID-19 HOME TEST) KIT Use as directed 4 each 0   COVID-19 At Home Antigen Test (CARESTART COVID-19 HOME TEST) KIT Use as directed per package instructions 4 each 0   Cyanocobalamin (VITAMIN B-12 PO) Take by mouth.     Efinaconazole 10 % SOLN Apply 1 drop topically at bedtime. A drop onto and end of a dry toenail,rub around nail and surrounding. 8 mL ML   fluticasone (FLOVENT HFA) 110 MCG/ACT inhaler Inhale 2 puffs into the lungs in the morning and at bedtime. 1 each 3   letrozole (FEMARA) 2.5 MG tablet Take 2.5 mg by mouth daily. (Patient not taking: Reported on 07/08/2022)     letrozole (FEMARA) 2.5 MG tablet Take 1 tablet (2.5 mg total) by mouth once daily (Patient not taking: Reported on 07/08/2022) 90 tablet 4   Leuprolide Acetate (LUPRON DEPOT, 557-MONTH, IM) Inject into the muscle.     LORazepam (ATIVAN) 0.5 MG tablet Take 0.5-1 tablets (0.25-0.5 mg total) by mouth daily as needed for anxiety. 30 tablet 1   Multiple Vitamin  (MULTIVITAMIN ADULT PO) Take by mouth.     oxybutynin (DITROPAN) 5 MG tablet Take 1/2 tablets (2.5 mg total) by mouth 2 (two) times daily 30 tablet 11   PROBIOTIC PRODUCT PO Take 1 capsule by mouth daily.      Spacer/Aero-Holding Chambers (AEROCHAMBER PLUS) inhaler Use as instructed to use with inahaler. 1 each 1   triamcinolone (KENALOG) 0.025 % cream Apply to the affected area twice daily prn. 80 g 0   Current Facility-Administered Medications  Medication Dose Route Frequency Provider Last Rate Last Admin   0.9 %  sodium chloride infusion  500 mL Intravenous Once Thalia Turkington, Carlota Raspberry, MD  Allergies as of 07/08/2022 - Review Complete 07/08/2022  Allergen Reaction Noted   Sulfa antibiotics Hives 01/24/2017   Sulfonylureas  01/24/2017    Family History  Problem Relation Age of Onset   Alcohol abuse Maternal Grandfather        bile duct cancer   Heart disease Maternal Grandfather    Cancer Maternal Grandfather 89       bile duct cancer   Hypertension Maternal Grandfather    Hypertension Paternal Grandfather    Heart disease Paternal Grandfather    Colon cancer Neg Hx    Stomach cancer Neg Hx    Esophageal cancer Neg Hx     Social History   Socioeconomic History   Marital status: Married    Spouse name: Berdia Lachman   Number of children: 2   Years of education: Not on file   Highest education level: Not on file  Occupational History   Occupation: dentist  Tobacco Use   Smoking status: Never   Smokeless tobacco: Never  Vaping Use   Vaping Use: Never used  Substance and Sexual Activity   Alcohol use: Yes    Comment: 1 glass of wine a wk   Drug use: No   Sexual activity: Yes  Other Topics Concern   Not on file  Social History Narrative   Not on file   Social Determinants of Health   Financial Resource Strain: Not on file  Food Insecurity: Not on file  Transportation Needs: Not on file  Physical Activity: Not on file  Stress: Not on file  Social  Connections: Not on file  Intimate Partner Violence: Not on file    Review of Systems: All other review of systems negative except as mentioned in the HPI.  Physical Exam: Vital signs BP 115/74   Pulse 77   Temp 98.7 F (37.1 C) (Temporal)   Ht _0  (1.549 m)   Wt 105 lb (47.6 kg)   SpO2 100%   BMI 19.84 kg/m   General:   Alert,  Well-developed, pleasant and cooperative in NAD Lungs:  Clear throughout to auscultation.   Heart:  Regular rate and rhythm Abdomen:  Soft, nontender and nondistended.   Neuro/Psych:  Alert and cooperative. Normal mood and affect. A and O x 3  Jolly Mango, MD [facility]

## 2022-07-08 NOTE — Progress Notes (Signed)
VS completed by CW.   Pt's states no medical or surgical changes since previsit or office visit.  

## 2022-07-08 NOTE — Op Note (Signed)
[facility] Patient Name: Tabitha Brown Procedure Date: 07/08/2022 10:06 AM MRN: [id number] Endoscopist: Remo Lipps P. Havery Moros , MD Age: 41 Referring MD:  Date of Birth: August 27, 1981 Gender: Female Account #: [id number] Procedure:                Upper GI endoscopy Indications:              Positive celiac serologies, strong family history                            of celiac disease Medicines:                Monitored Anesthesia Care Procedure:                Pre-Anesthesia Assessment:                           - Prior to the procedure, a History and Physical                            was performed, and patient medications and                            allergies were reviewed. The patient's tolerance of                            previous anesthesia was also reviewed. The risks                            and benefits of the procedure and the sedation                            options and risks were discussed with the patient.                            All questions were answered, and informed consent                            was obtained. Prior Anticoagulants: The patient has                            taken no previous anticoagulant or antiplatelet                            agents. ASA Grade Assessment: II - A patient with                            mild systemic disease. After reviewing the risks                            and benefits, the patient was deemed in                            satisfactory condition to undergo the procedure.  After obtaining informed consent, the endoscope was                            passed under direct vision. Throughout the                            procedure, the patient's blood pressure, pulse, and                            oxygen saturations were monitored continuously. The                            Endoscope was introduced through the mouth, and                            advanced to the second part of  duodenum. The upper                            GI endoscopy was accomplished without difficulty.                            The patient tolerated the procedure well. Scope In: Scope Out: Findings:                 Esophagogastric landmarks were identified: the                            Z-line was found at 40 cm, the gastroesophageal                            junction was found at 40 cm and the upper extent of                            the gastric folds was found at 40 cm from the                            incisors.                           The exam of the esophagus was otherwise normal.                           The entire examined stomach was normal.                           The examined duodenum was mostly normal - perhaps                            some very subtle flattening of the duodenal sweep.                            Biopsies for histology from the duodenum were taken  with a cold forceps for evaluation of celiac                            disease. Complications:            No immediate complications. Estimated blood loss:                            Minimal. Estimated Blood Loss:     Estimated blood loss was minimal. Impression:               - Esophagogastric landmarks identified.                           - Normal esophagus.                           - Normal stomach.                           - Normal examined duodenum with ? subtle flattening                            of duodenal sweep. Biopsied. Recommendation:           - Patient has a contact number available for                            emergencies. The signs and symptoms of potential                            delayed complications were discussed with the                            patient. Return to normal activities tomorrow.                            Written discharge instructions were provided to the                            patient.                           - Resume  previous diet.                           - Continue present medications.                           - Await pathology results. Remo Lipps P. Reiana Poteet, MD 07/08/2022 11:02:13 AM This report has been signed electronically.

## 2022-07-08 NOTE — Progress Notes (Signed)
1017 Robinul 0.1 mg IV given due large amount of secretions upon assessment.  MD made aware, vss

## 2022-07-08 NOTE — Progress Notes (Signed)
Report given to PACU, vss 

## 2022-07-08 NOTE — Op Note (Signed)
[facility] Patient Name: Tabitha Brown Procedure Date: 07/08/2022 10:05 AM MRN: [id number] Endoscopist: Remo Lipps P. Havery Moros , MD Age: 41 Referring MD:  Date of Birth: December 24, 1980 Gender: Female Account #: [id number] Procedure:                Colonoscopy Indications:              history of intermittent rectal bleeding Medicines:                Monitored Anesthesia Care Procedure:                Pre-Anesthesia Assessment:                           - Prior to the procedure, a History and Physical                            was performed, and patient medications and                            allergies were reviewed. The patient's tolerance of                            previous anesthesia was also reviewed. The risks                            and benefits of the procedure and the sedation                            options and risks were discussed with the patient.                            All questions were answered, and informed consent                            was obtained. Prior Anticoagulants: The patient has                            taken no previous anticoagulant or antiplatelet                            agents. ASA Grade Assessment: II - A patient with                            mild systemic disease. After reviewing the risks                            and benefits, the patient was deemed in                            satisfactory condition to undergo the procedure.                           After obtaining informed consent, the colonoscope  was passed under direct vision. Throughout the                            procedure, the patient's blood pressure, pulse, and                            oxygen saturations were monitored continuously. The                            0405 PCF-H190TL Slim SB Colonoscope was introduced                            through the anus and advanced to the the cecum,                            identified by  appendiceal orifice and ileocecal                            valve. The colonoscopy was performed without                            difficulty. The patient tolerated the procedure                            well. The quality of the bowel preparation was                            good. The ileocecal valve, appendiceal orifice, and                            rectum were photographed. Scope In: 10:27:39 AM Scope Out: 10:52:31 AM Scope Withdrawal Time: 0 hours 14 minutes 32 seconds  Total Procedure Duration: 0 hours 24 minutes 52 seconds  Findings:                 The perianal and digital rectal examinations were                            normal.                           A 10 mm polyp was found in the ascending colon. The                            polyp was flat. The polyp was removed with a cold                            snare. Resection and retrieval were complete.                           A 5 mm polyp was found in the transverse colon. The                            polyp was flat. The polyp was  removed with a cold                            snare. Resection and retrieval were complete. I                            thought a photo of this was taken but it was not.                           The colon was extremely tortuous - ultra slim                            pediatric colonoscope used to complete this case.                           Internal hemorrhoids were found during                            retroflexion. The hemorrhoids were small.                           Limited views of the ileum were normal. The exam                            was otherwise without abnormality. Complications:            No immediate complications. Estimated blood loss:                            Minimal. Estimated Blood Loss:     Estimated blood loss was minimal. Impression:               - One 10 mm polyp in the ascending colon, removed                            with a cold snare. Resected and  retrieved.                           - One 5 mm polyp in the transverse colon, removed                            with a cold snare. Resected and retrieved.                           - Tortuous colon.                           - Internal hemorrhoids.                           - The examination was otherwise normal.                           Suspect small hemorrhoids are the cause of bleeding  symptoms. Recommendation:           - Patient has a contact number available for                            emergencies. The signs and symptoms of potential                            delayed complications were discussed with the                            patient. Return to normal activities tomorrow.                            Written discharge instructions were provided to the                            patient.                           - Resume previous diet.                           - Continue present medications.                           - Trial of Citrucel daily for hemorrhoids, and                            Calmol4 suppositories as needed OTC                           - Await pathology results. Remo Lipps P. Cearra Portnoy, MD 07/08/2022 10:59:10 AM This report has been signed electronically.

## 2022-07-09 ENCOUNTER — Telehealth: Payer: Self-pay

## 2022-07-09 NOTE — Telephone Encounter (Signed)
  Follow up Call-     07/08/2022    9:57 AM  Call back number  Post procedure Call Back phone  # [phone]  Permission to leave phone message Yes     Patient questions:  Do you have a fever, pain , or abdominal swelling? No. Pain Score  0 *  Have you tolerated food without any problems? Yes.    Have you been able to return to your normal activities? Yes.    Do you have any questions about your discharge instructions: Diet   No. Medications  No. Follow up visit  No.  Do you have questions or concerns about your Care? No.  Actions: * If pain score is 4 or above: No action needed, pain <4.

## 2022-07-12 ENCOUNTER — Other Ambulatory Visit (HOSPITAL_BASED_OUTPATIENT_CLINIC_OR_DEPARTMENT_OTHER): Payer: Self-pay

## 2022-07-14 DIAGNOSIS — S161XXD Strain of muscle, fascia and tendon at neck level, subsequent encounter: Secondary | ICD-10-CM | POA: Diagnosis not present

## 2022-07-21 ENCOUNTER — Other Ambulatory Visit (HOSPITAL_BASED_OUTPATIENT_CLINIC_OR_DEPARTMENT_OTHER): Payer: Self-pay

## 2022-07-21 DIAGNOSIS — S161XXD Strain of muscle, fascia and tendon at neck level, subsequent encounter: Secondary | ICD-10-CM | POA: Diagnosis not present

## 2022-07-28 DIAGNOSIS — S161XXD Strain of muscle, fascia and tendon at neck level, subsequent encounter: Secondary | ICD-10-CM | POA: Diagnosis not present

## 2022-07-29 DIAGNOSIS — F418 Other specified anxiety disorders: Secondary | ICD-10-CM | POA: Diagnosis not present

## 2022-07-29 DIAGNOSIS — R232 Flushing: Secondary | ICD-10-CM | POA: Diagnosis not present

## 2022-07-29 DIAGNOSIS — Z853 Personal history of malignant neoplasm of breast: Secondary | ICD-10-CM | POA: Diagnosis not present

## 2022-07-29 DIAGNOSIS — Z170 Estrogen receptor positive status [ER+]: Secondary | ICD-10-CM | POA: Diagnosis not present

## 2022-07-29 DIAGNOSIS — R748 Abnormal levels of other serum enzymes: Secondary | ICD-10-CM | POA: Diagnosis not present

## 2022-07-29 DIAGNOSIS — C50011 Malignant neoplasm of nipple and areola, right female breast: Secondary | ICD-10-CM | POA: Diagnosis not present

## 2022-07-29 DIAGNOSIS — R7401 Elevation of levels of liver transaminase levels: Secondary | ICD-10-CM | POA: Diagnosis not present

## 2022-07-29 DIAGNOSIS — Z1239 Encounter for other screening for malignant neoplasm of breast: Secondary | ICD-10-CM | POA: Diagnosis not present

## 2022-07-29 DIAGNOSIS — M549 Dorsalgia, unspecified: Secondary | ICD-10-CM | POA: Diagnosis not present

## 2022-07-29 DIAGNOSIS — Z5111 Encounter for antineoplastic chemotherapy: Secondary | ICD-10-CM | POA: Diagnosis not present

## 2022-07-29 DIAGNOSIS — M8580 Other specified disorders of bone density and structure, unspecified site: Secondary | ICD-10-CM | POA: Diagnosis not present

## 2022-07-30 ENCOUNTER — Other Ambulatory Visit (HOSPITAL_BASED_OUTPATIENT_CLINIC_OR_DEPARTMENT_OTHER): Payer: Self-pay

## 2022-07-30 MED ORDER — ESCITALOPRAM OXALATE 10 MG PO TABS
ORAL_TABLET | ORAL | 3 refills | Status: DC
  Filled 2022-07-30 – 2023-01-10 (×2): qty 45, 90d supply, fill #0
  Filled 2023-04-12: qty 45, 90d supply, fill #1
  Filled 2023-07-04: qty 45, 90d supply, fill #2

## 2022-08-06 DIAGNOSIS — S161XXD Strain of muscle, fascia and tendon at neck level, subsequent encounter: Secondary | ICD-10-CM | POA: Diagnosis not present

## 2022-08-10 ENCOUNTER — Other Ambulatory Visit (HOSPITAL_BASED_OUTPATIENT_CLINIC_OR_DEPARTMENT_OTHER): Payer: Self-pay

## 2022-08-10 MED ORDER — NYSTATIN-TRIAMCINOLONE 100000-0.1 UNIT/GM-% EX CREA
TOPICAL_CREAM | CUTANEOUS | 3 refills | Status: DC
  Filled 2022-08-10 (×2): qty 30, 7d supply, fill #0
  Filled 2023-04-18: qty 60, 14d supply, fill #1

## 2022-08-11 DIAGNOSIS — Z170 Estrogen receptor positive status [ER+]: Secondary | ICD-10-CM | POA: Diagnosis not present

## 2022-08-11 DIAGNOSIS — C50011 Malignant neoplasm of nipple and areola, right female breast: Secondary | ICD-10-CM | POA: Diagnosis not present

## 2022-08-18 DIAGNOSIS — S161XXD Strain of muscle, fascia and tendon at neck level, subsequent encounter: Secondary | ICD-10-CM | POA: Diagnosis not present

## 2022-08-26 DIAGNOSIS — Z170 Estrogen receptor positive status [ER+]: Secondary | ICD-10-CM | POA: Diagnosis not present

## 2022-08-26 DIAGNOSIS — R7401 Elevation of levels of liver transaminase levels: Secondary | ICD-10-CM | POA: Diagnosis not present

## 2022-08-26 DIAGNOSIS — C50011 Malignant neoplasm of nipple and areola, right female breast: Secondary | ICD-10-CM | POA: Diagnosis not present

## 2022-09-15 ENCOUNTER — Ambulatory Visit (INDEPENDENT_AMBULATORY_CARE_PROVIDER_SITE_OTHER): Payer: 59 | Admitting: Gastroenterology

## 2022-09-15 ENCOUNTER — Encounter: Payer: Self-pay | Admitting: Gastroenterology

## 2022-09-15 ENCOUNTER — Other Ambulatory Visit (INDEPENDENT_AMBULATORY_CARE_PROVIDER_SITE_OTHER): Payer: 59

## 2022-09-15 VITALS — BP 98/62 | HR 73 | Ht 61.0 in | Wt 105.0 lb

## 2022-09-15 DIAGNOSIS — K900 Celiac disease: Secondary | ICD-10-CM

## 2022-09-15 DIAGNOSIS — Z86010 Personal history of colonic polyps: Secondary | ICD-10-CM

## 2022-09-15 DIAGNOSIS — K429 Umbilical hernia without obstruction or gangrene: Secondary | ICD-10-CM

## 2022-09-15 DIAGNOSIS — M8580 Other specified disorders of bone density and structure, unspecified site: Secondary | ICD-10-CM | POA: Diagnosis not present

## 2022-09-15 DIAGNOSIS — R748 Abnormal levels of other serum enzymes: Secondary | ICD-10-CM

## 2022-09-15 LAB — HEPATIC FUNCTION PANEL
ALT: 24 U/L (ref 0–35)
AST: 27 U/L (ref 0–37)
Albumin: 5.1 g/dL (ref 3.5–5.2)
Alkaline Phosphatase: 130 U/L — ABNORMAL HIGH (ref 39–117)
Bilirubin, Direct: 0.1 mg/dL (ref 0.0–0.3)
Total Bilirubin: 0.5 mg/dL (ref 0.2–1.2)
Total Protein: 8 g/dL (ref 6.0–8.3)

## 2022-09-15 LAB — BASIC METABOLIC PANEL
BUN: 13 mg/dL (ref 6–23)
CO2: 31 mEq/L (ref 19–32)
Calcium: 10 mg/dL (ref 8.4–10.5)
Chloride: 101 mEq/L (ref 96–112)
Creatinine, Ser: 0.78 mg/dL (ref 0.40–1.20)
GFR: 93.76 mL/min (ref >60.00)
Glucose, Bld: 109 mg/dL — ABNORMAL HIGH (ref 70–99)
Potassium: 3.7 mEq/L (ref 3.5–5.1)
Sodium: 140 mEq/L (ref 135–145)

## 2022-09-15 LAB — IBC + FERRITIN
Ferritin: 12.8 ng/mL (ref 10.0–291.0)
Iron: 156 ug/dL — ABNORMAL HIGH (ref 42–145)
Saturation Ratios: 33.1 % (ref 20.0–50.0)
TIBC: 471.8 ug/dL — ABNORMAL HIGH (ref 250.0–450.0)
Transferrin: 337 mg/dL (ref 212.0–360.0)

## 2022-09-15 LAB — GAMMA GT: GGT: 14 U/L (ref 7–51)

## 2022-09-15 LAB — VITAMIN B12: Vitamin B-12: 977 pg/mL — ABNORMAL HIGH (ref 211–911)

## 2022-09-15 MED ORDER — ZOSTER VAC RECOMB ADJUVANTED 50 MCG/0.5ML IM SUSR: 0.5000 mL | Freq: Once | INTRAMUSCULAR | 0 refills | Status: AC

## 2022-09-15 NOTE — Patient Instructions (Addendum)
_______________________________________________________  If you are age 42 or older, your body mass index should be between 23-30. Your Body mass index is 19.84 kg/m. If this is out of the aforementioned range listed, please consider follow up with your Primary Care Provider.  If you are age 33 or younger, your body mass index should be between 19-25. Your Body mass index is 19.84 kg/m. If this is out of the aformentioned range listed, please consider follow up with your Primary Care Provider.   ________________________________________________________  The [facility] providers would like to encourage you to use [facility] to communicate with providers for non-urgent requests or questions.  Due to long hold times on the telephone, sending your provider a message by [facility] may be a faster and more efficient way to get a response.  Please allow 48 business hours for a response.  Please remember that this is for non-urgent requests.  _______________________________________________________  Please go to the lab in the basement of our building to have lab work done as you leave today. Hit "B" for basement when you get on the elevator.  When the doors open the lab is on your left.  We will call you with the results. Thank you.  You will be due for Celiac Panel labs in 3 to 4 months.  We will contact you when it is time to get this done.  We are giving you a printed script to take to your pharmacy for the Shingrix vaccine and the Pneumococcal 20 Vaccination.  Please follow up in 1 year  Thank you for entrusting me with your care and for choosing [facility], Dr. Truxton Cellar

## 2022-09-15 NOTE — Progress Notes (Signed)
HPI :  41 year old female here for a follow up visit to discuss recently diagnosed celiac disease. Recall she has a history of breast cancer in remission.  Recall she had a history of vitamin deficiencies in the past despite taking vitamin supplements.  Her mother, sister, and brother, all have confirmed diagnosis of celiac disease.  She previously had a celiac panel that was drawn that was grossly positive as below.  She had an EGD with me to further evaluate in July.  Her small intestine seem mostly normal, with some subtle flattening however biopsies were consistent with celiac disease.  She has been on a gluten-free diet since then.  She generally can tell if she eats gluten as she did not feel too bad initially.  She has been very compliant with a gluten-free diet and tolerates it pretty well.  She really denies much problems with her bowels at this point time.  We had discussed doing some labs for vitamin deficiencies which she has not been able to do yet.  She did have a DEXA scan done at [place] this past April and she does have osteopenia.  She is taking calcium and vitamin D.  Her prior vitamin D levels were okay.  Recall she also had a colonoscopy for her rectal bleeding, this was thought to be due to hemorrhoids noted on this exam.  Of note she did have a few polyps in her colon removed, largest was a 10 mm sessile serrated lesion.  She did have a bump in her liver enzymes from normal to ALT of 300s when she had a change from her letrozole to another regimen for her breast cancer, exemestane.  She had CT scan of her abdomen which showed no problems with her liver, no metastatic disease.  The exemestane was held, follow-up LFTs showed significant improvement.  Of note she has had a mildly elevated alk phos in recent months which has persisted.  This was thought to be drug-induced liver injury.  Her breast cancer remains in remission that she is aware of.  Otherwise she has a periumbilical  hernia, she was seen by general surgeon referred to a plastic surgeon to have this repaired.  She states this does bother her frequently especially when she is working out and wants it repaired.    EGD 07/08/22: - The exam of the esophagus was otherwise normal. - The entire examined stomach was normal. - The examined duodenum was mostly normal - perhaps some very subtle flattening of the duodenal sweep. Biopsies for histology from the duodenum were taken with a cold forceps for evaluation of celiac disease.  Colonoscopy 07/08/22: The perianal and digital rectal examinations were normal. - A 10 mm polyp was found in the ascending colon. The polyp was flat. The polyp was removed with a cold snare. Resection and retrieval were complete. - A 5 mm polyp was found in the transverse colon. The polyp was flat. The polyp was removed with a cold snare. Resection and retrieval were complete. I thought a photo of this was taken but it was not. - The colon was extremely tortuous - ultra slim pediatric colonoscope used to complete this case. - Internal hemorrhoids were found during retroflexion. The hemorrhoids were small. - Limited views of the ileum were normal. The exam was otherwise without abnormality.  1. Surgical [P], duodenal biopsy - PATHOLOGIC INTRAEPITHELIAL LYMPHOCYTOSIS (NON-DECRESCENDO PATTERN) WITH MILD/PARTIAL VILLOUS BLUNTING, CONSISTENT WITH CELIAC DISEASE (CLINICAL HISTORY NOTES ABNORMAL CELIAC ANTIBODY PANEL), MARSH IIIA. 2. Surgical [  P], colon, ascending and transverse, polyp (2) - SERRATED MUCOSAL POLYP(S), SIX FRAGMENTS, FAVOR/INDEFINITE FOR SESSILE SERRATED POLYPS, NEGATIVE FOR DYSPLASIA. - MELANOSIS COLI.   Past Medical History:  Diagnosis Date   Breast mass 07/26/2017   Right breast lump x 1 month    Cancer (HCC)    Celiac disease    Eating disorder    Family history of cholangiocarcinoma    PONV (postoperative nausea and vomiting)    hypotensive passes out after  epidurals   right breast ca    diagnosed 09/18/19   UTI (urinary tract infection)      Past Surgical History:  Procedure Laterality Date   APPENDECTOMY     AUGMENTATION MAMMAPLASTY Bilateral 2010   BREAST AUGMENTATION  2010   BREAST LUMPECTOMY WITH RADIOACTIVE SEED AND SENTINEL LYMPH NODE BIOPSY Right 10/18/2019   Procedure: RIGHT BREAST LUMPECTOMY WITH RADIOACTIVE SEED AND RIGHT AXILLARY SENTINEL LYMPH NODE BIOPSY;  Surgeon: Rolm Bookbinder, MD;  Location: [facility];  Service: General;  Laterality: Right;   RE-EXCISION OF BREAST CANCER,SUPERIOR MARGINS Right 11/01/2019   Procedure: RE-EXCISION OF RIGHT BREAST CANCER MARGINS;  Surgeon: Rolm Bookbinder, MD;  Location: [facility];  Service: General;  Laterality: Right;   Family History  Problem Relation Age of Onset   Alcohol abuse Maternal Grandfather        bile duct cancer   Heart disease Maternal Grandfather    Cancer Maternal Grandfather 89       bile duct cancer   Hypertension Maternal Grandfather    Hypertension Paternal Grandfather    Heart disease Paternal Grandfather    Colon cancer Neg Hx    Stomach cancer Neg Hx    Esophageal cancer Neg Hx    Social History   Tobacco Use   Smoking status: Never   Smokeless tobacco: Never  Vaping Use   Vaping Use: Never used  Substance Use Topics   Alcohol use: Yes    Comment: 1 glass of wine a wk   Drug use: No   Current Outpatient Medications  Medication Sig Dispense Refill   Leuprolide Acetate (LUPRON DEPOT, 63-MONTH, IM) Inject into the muscle.     LORazepam (ATIVAN) 0.5 MG tablet Take 0.5-1 tablets (0.25-0.5 mg total) by mouth daily as needed for anxiety. 30 tablet 1   nystatin-triamcinolone (MYCOLOG II) cream APPLY TO THE EXTERNAL AFFECTED AREAS TWICE A DAY AS NEEDED 60 g 3   Zoster Vaccine Adjuvanted [facility]) injection Inject 0.5 mLs into the muscle once for 1 dose. 0.5 mL 0   albuterol (VENTOLIN HFA) 108 (90 Base) MCG/ACT inhaler Inhale 2 puffs into  the lungs every 6 (six) hours as needed for wheezing or shortness of breath. (Patient not taking: Reported on 09/15/2022) 8 g 2   ascorbic acid (VITAMIN C) 500 MG tablet Take 500 mg by mouth daily. (Patient not taking: Reported on 09/15/2022)     BIOTIN PO Take by mouth. (Patient not taking: Reported on 09/15/2022)     cholecalciferol (VITAMIN D3) 25 MCG (1000 UNIT) tablet Take 1,000 Units by mouth daily.     COVID-19 At Home Antigen Test [facility] COVID-19 HOME TEST) KIT Use as directed (Patient not taking: Reported on 09/15/2022) 4 each 0   COVID-19 At Home Antigen Test (CARESTART COVID-19 HOME TEST) KIT Use as directed per package instructions (Patient not taking: Reported on 09/15/2022) 4 each 0   Cyanocobalamin (VITAMIN B-12 PO) Take by mouth. (Patient not taking: Reported on 09/15/2022)     Efinaconazole  10 % SOLN Apply 1 drop topically at bedtime. A drop onto and end of a dry toenail,rub around nail and surrounding. (Patient not taking: Reported on 09/15/2022) 8 mL ML   escitalopram (LEXAPRO) 10 MG tablet Take 0.5 tablet by mouth once daily. (Patient not taking: Reported on 09/15/2022) 45 tablet 3   escitalopram (LEXAPRO) 10 MG tablet Take 0.5 tablets (5 mg total) by mouth once daily 45 tablet 3   exemestane (AROMASIN) 25 MG tablet Take 1 tablet (25 mg total) by mouth once daily for 90 days (Patient not taking: Reported on 09/15/2022) 30 tablet 2   fluticasone (FLOVENT HFA) 110 MCG/ACT inhaler Inhale 2 puffs into the lungs in the morning and at bedtime. (Patient not taking: Reported on 09/15/2022) 1 each 3   ibuprofen (ADVIL) 600 MG tablet Take 1 tablet (600 mg total) by mouth every 6 (six) hours. (Patient not taking: Reported on 09/15/2022) 30 tablet 0   letrozole (FEMARA) 2.5 MG tablet Take 2.5 mg by mouth daily. (Patient not taking: Reported on 07/08/2022)     letrozole (FEMARA) 2.5 MG tablet Take 1 tablet (2.5 mg total) by mouth once daily (Patient not taking: Reported on 07/08/2022) 90 tablet 4    Multiple Vitamin (MULTIVITAMIN ADULT PO) Take by mouth. (Patient not taking: Reported on 09/15/2022)     oxybutynin (DITROPAN) 5 MG tablet Take 1/2 tablets (2.5 mg total) by mouth 2 (two) times daily (Patient not taking: Reported on 09/15/2022) 30 tablet 11   triamcinolone (KENALOG) 0.025 % cream Apply to the affected area twice daily prn. (Patient not taking: Reported on 09/15/2022) 80 g 0   Current Facility-Administered Medications  Medication Dose Route Frequency Provider Last Rate Last Admin   0.9 %  sodium chloride infusion  500 mL Intravenous Once Amenda Duclos, Carlota Raspberry, MD       Allergies  Allergen Reactions   Sulfa Antibiotics Hives   Sulfonylureas      Review of Systems: All systems reviewed and negative except where noted in HPI.    Physical Exam: BP 98/62   Pulse 73   Ht 5' 1"  (1.549 m)   Wt 105 lb (47.6 kg)   BMI 19.84 kg/m  Constitutional: Pleasant,well-developed, female in no acute distress. Neurological: Alert and oriented to person place and time. Psychiatric: Normal mood and affect. Behavior is normal.   ASSESSMENT: 41 y.o. female here for assessment of the following  1. Celiac disease   2. Elevated alkaline phosphatase level   3. Osteopenia, unspecified location   4. History of colon polyps   5. Umbilical hernia without obstruction and without gangrene    Relatively new diagnosis of celiac disease.  We had a good discussion about celiac disease and long-term potential associations of the disease.  She has a good understanding of this, is very compliant with a gluten-free diet which she is accustomed to.  She is due for some basic labs to assess for vitamin deficiencies and she is agreeable to this.  I would like to recheck her celiac serology about 6 months after she started a gluten-free diet.  She already has a known diagnosis of osteopenia, perhaps due to her celiac, she is being managed for that, on calcium and vitamin D.  She will need to keep an eye on her  DEXA every few years.  Given relative hyposplenism with celiac disease recommend the new pneumonia vaccine, provided written prescription for that, as well as Shingrix vaccine per her request that she has had a history  of shingles in the past.  She should see me once annually for this issue if not sooner with any issues.  Discussed her recent liver enzyme elevation which appears most likely a drug reaction from her breast cancer regimen.  This was stopped and enzymes came down nicely.  She has had elevated alk phos on few occasions.  Will recheck LFTs along with GGT, further work-up if needed.  Recent imaging is reassuring against any metastatic disease.  She is otherwise due for repeat colonoscopy in 3 years or so given advanced sessile serrated adenoma on her last colonoscopy.  We otherwise discussed her hernia, she is seeking elective repair given she is fairly symptomatic from this which I think is reasonable, she is set up with a plastic surgeon to have this done soon.   PLAN: - LFTs, GGT, TIBC / ferritin, B12 level - recall for celiac serology panel in 3-4 months - gluten free diet - script for new pneumo vacc - 20, shingrix vaccine - history of shingles - DEXA every 2 yeas - vitamin D and calcium recommended - follow up with plastic surgery for hernia repair - repeat colonoscopy in 3 years - follow up annually  I spent 35 minutes of time, including in depth chart review, face-to-face time with the patient, and documentation.  Jolly Mango, MD [facility]

## 2022-09-16 NOTE — Telephone Encounter (Signed)
This has been addressed. Dr. Havery Moros sent patient a MyChart message with results and the patient has reviewed.

## 2022-09-23 DIAGNOSIS — C50011 Malignant neoplasm of nipple and areola, right female breast: Secondary | ICD-10-CM | POA: Diagnosis not present

## 2022-09-23 DIAGNOSIS — Z23 Encounter for immunization: Secondary | ICD-10-CM | POA: Diagnosis not present

## 2022-09-23 DIAGNOSIS — Z170 Estrogen receptor positive status [ER+]: Secondary | ICD-10-CM | POA: Diagnosis not present

## 2022-09-30 ENCOUNTER — Other Ambulatory Visit (HOSPITAL_BASED_OUTPATIENT_CLINIC_OR_DEPARTMENT_OTHER): Payer: Self-pay

## 2022-09-30 DIAGNOSIS — Z170 Estrogen receptor positive status [ER+]: Secondary | ICD-10-CM | POA: Diagnosis not present

## 2022-09-30 DIAGNOSIS — C50011 Malignant neoplasm of nipple and areola, right female breast: Secondary | ICD-10-CM | POA: Diagnosis not present

## 2022-09-30 DIAGNOSIS — R748 Abnormal levels of other serum enzymes: Secondary | ICD-10-CM | POA: Diagnosis not present

## 2022-09-30 MED ORDER — PROMETHAZINE HCL 25 MG PO TABS
ORAL_TABLET | ORAL | 1 refills | Status: DC
  Filled 2022-09-30: qty 10, 3d supply, fill #0

## 2022-09-30 MED ORDER — HYDROCODONE-ACETAMINOPHEN 5-325 MG PO TABS
ORAL_TABLET | ORAL | 0 refills | Status: DC
  Filled 2022-09-30: qty 42, 7d supply, fill #0

## 2022-09-30 MED ORDER — CEPHALEXIN 500 MG PO CAPS
1000.0000 mg | ORAL_CAPSULE | Freq: Two times a day (BID) | ORAL | 1 refills | Status: DC
  Filled 2022-09-30: qty 28, 7d supply, fill #0
  Filled 2022-10-21: qty 28, 7d supply, fill #1

## 2022-10-11 ENCOUNTER — Other Ambulatory Visit (HOSPITAL_BASED_OUTPATIENT_CLINIC_OR_DEPARTMENT_OTHER): Payer: Self-pay

## 2022-10-19 ENCOUNTER — Other Ambulatory Visit (HOSPITAL_BASED_OUTPATIENT_CLINIC_OR_DEPARTMENT_OTHER): Payer: Self-pay

## 2022-10-19 MED ORDER — LUPRON DEPOT (1-MONTH) 3.75 MG IM KIT
3.7500 mg | PACK | INTRAMUSCULAR | 11 refills | Status: DC
  Filled 2022-10-19: qty 1, 30d supply, fill #0
  Filled 2022-12-13: qty 1, 30d supply, fill #1
  Filled 2023-01-10 – 2023-01-11 (×2): qty 1, 30d supply, fill #2
  Filled 2023-02-08: qty 1, 30d supply, fill #3
  Filled 2023-03-07: qty 1, 30d supply, fill #4
  Filled 2023-04-06: qty 1, 30d supply, fill #5
  Filled 2023-05-05: qty 1, 30d supply, fill #6
  Filled 2023-06-01: qty 1, 30d supply, fill #7
  Filled 2023-07-01: qty 1, 30d supply, fill #8
  Filled 2023-07-25 – 2023-07-26 (×2): qty 1, 30d supply, fill #9
  Filled 2023-08-22: qty 1, 30d supply, fill #10
  Filled 2023-09-19: qty 1, 30d supply, fill #11

## 2022-10-21 ENCOUNTER — Other Ambulatory Visit: Payer: Self-pay | Admitting: Family Medicine

## 2022-10-21 DIAGNOSIS — Z170 Estrogen receptor positive status [ER+]: Secondary | ICD-10-CM | POA: Diagnosis not present

## 2022-10-21 DIAGNOSIS — B351 Tinea unguium: Secondary | ICD-10-CM

## 2022-10-21 DIAGNOSIS — C50011 Malignant neoplasm of nipple and areola, right female breast: Secondary | ICD-10-CM | POA: Diagnosis not present

## 2022-10-22 ENCOUNTER — Other Ambulatory Visit (HOSPITAL_BASED_OUTPATIENT_CLINIC_OR_DEPARTMENT_OTHER): Payer: Self-pay

## 2022-10-24 MED ORDER — EFINACONAZOLE 10 % EX SOLN: 1.0000 [drp] | Freq: Every day | CUTANEOUS | Status: DC

## 2022-11-16 ENCOUNTER — Other Ambulatory Visit (HOSPITAL_BASED_OUTPATIENT_CLINIC_OR_DEPARTMENT_OTHER): Payer: Self-pay

## 2022-11-17 ENCOUNTER — Other Ambulatory Visit (HOSPITAL_BASED_OUTPATIENT_CLINIC_OR_DEPARTMENT_OTHER): Payer: Self-pay

## 2022-11-19 DIAGNOSIS — C50311 Malignant neoplasm of lower-inner quadrant of right female breast: Secondary | ICD-10-CM | POA: Diagnosis not present

## 2022-11-24 DIAGNOSIS — Z23 Encounter for immunization: Secondary | ICD-10-CM | POA: Diagnosis not present

## 2022-12-13 ENCOUNTER — Other Ambulatory Visit (HOSPITAL_BASED_OUTPATIENT_CLINIC_OR_DEPARTMENT_OTHER): Payer: Self-pay

## 2022-12-14 ENCOUNTER — Other Ambulatory Visit: Payer: Self-pay

## 2022-12-14 DIAGNOSIS — Z853 Personal history of malignant neoplasm of breast: Secondary | ICD-10-CM | POA: Diagnosis not present

## 2022-12-14 DIAGNOSIS — R92342 Mammographic extreme density, left breast: Secondary | ICD-10-CM | POA: Diagnosis not present

## 2022-12-16 ENCOUNTER — Other Ambulatory Visit (HOSPITAL_BASED_OUTPATIENT_CLINIC_OR_DEPARTMENT_OTHER): Payer: Self-pay

## 2022-12-17 ENCOUNTER — Other Ambulatory Visit (HOSPITAL_BASED_OUTPATIENT_CLINIC_OR_DEPARTMENT_OTHER): Payer: Self-pay

## 2022-12-17 DIAGNOSIS — C50311 Malignant neoplasm of lower-inner quadrant of right female breast: Secondary | ICD-10-CM | POA: Diagnosis not present

## 2022-12-18 ENCOUNTER — Other Ambulatory Visit (HOSPITAL_BASED_OUTPATIENT_CLINIC_OR_DEPARTMENT_OTHER): Payer: Self-pay

## 2022-12-20 ENCOUNTER — Other Ambulatory Visit (HOSPITAL_BASED_OUTPATIENT_CLINIC_OR_DEPARTMENT_OTHER): Payer: Self-pay

## 2022-12-20 MED ORDER — LETROZOLE 2.5 MG PO TABS
2.5000 mg | ORAL_TABLET | Freq: Every day | ORAL | 3 refills | Status: DC
  Filled 2022-12-20: qty 90, 90d supply, fill #0
  Filled 2023-01-10 – 2023-03-16 (×2): qty 90, 90d supply, fill #1
  Filled 2023-06-01: qty 90, 90d supply, fill #2
  Filled 2023-06-14 – 2023-09-10 (×3): qty 90, 90d supply, fill #3

## 2022-12-21 ENCOUNTER — Other Ambulatory Visit: Payer: Self-pay

## 2022-12-21 ENCOUNTER — Other Ambulatory Visit (HOSPITAL_BASED_OUTPATIENT_CLINIC_OR_DEPARTMENT_OTHER): Payer: Self-pay

## 2022-12-25 ENCOUNTER — Other Ambulatory Visit (HOSPITAL_BASED_OUTPATIENT_CLINIC_OR_DEPARTMENT_OTHER): Payer: Self-pay

## 2022-12-27 ENCOUNTER — Telehealth: Payer: Self-pay | Admitting: Gastroenterology

## 2022-12-27 NOTE — Telephone Encounter (Signed)
Just the celiac panel is fine. She can come in February to get it done. Thanks

## 2022-12-27 NOTE — Telephone Encounter (Signed)
Patient is calling wondering if she is needing to get lab work done. Please advise

## 2022-12-27 NOTE — Telephone Encounter (Signed)
Called and spoke with patient regarding recommendations. Pt will stop by at her convenience in February for celiac labs. Pt verbalized understanding and had no concerns at the end of the call.

## 2022-12-27 NOTE — Telephone Encounter (Signed)
Looks like you wanted to recheck Celiac panel about 3-4 months from last blood draw. Any additional labs that need to be checked?

## 2022-12-31 DIAGNOSIS — C50011 Malignant neoplasm of nipple and areola, right female breast: Secondary | ICD-10-CM | POA: Diagnosis not present

## 2022-12-31 DIAGNOSIS — N644 Mastodynia: Secondary | ICD-10-CM | POA: Diagnosis not present

## 2022-12-31 DIAGNOSIS — Z9011 Acquired absence of right breast and nipple: Secondary | ICD-10-CM | POA: Diagnosis not present

## 2022-12-31 DIAGNOSIS — Z9889 Other specified postprocedural states: Secondary | ICD-10-CM | POA: Diagnosis not present

## 2022-12-31 DIAGNOSIS — Z170 Estrogen receptor positive status [ER+]: Secondary | ICD-10-CM | POA: Diagnosis not present

## 2022-12-31 DIAGNOSIS — N6452 Nipple discharge: Secondary | ICD-10-CM | POA: Diagnosis not present

## 2022-12-31 DIAGNOSIS — M79622 Pain in left upper arm: Secondary | ICD-10-CM | POA: Diagnosis not present

## 2023-01-10 ENCOUNTER — Other Ambulatory Visit (HOSPITAL_BASED_OUTPATIENT_CLINIC_OR_DEPARTMENT_OTHER): Payer: Self-pay

## 2023-01-10 ENCOUNTER — Other Ambulatory Visit: Payer: Self-pay

## 2023-01-11 ENCOUNTER — Other Ambulatory Visit (HOSPITAL_BASED_OUTPATIENT_CLINIC_OR_DEPARTMENT_OTHER): Payer: Self-pay

## 2023-01-11 ENCOUNTER — Encounter: Payer: Self-pay | Admitting: Family Medicine

## 2023-01-11 DIAGNOSIS — F418 Other specified anxiety disorders: Secondary | ICD-10-CM | POA: Diagnosis not present

## 2023-01-11 DIAGNOSIS — C50011 Malignant neoplasm of nipple and areola, right female breast: Secondary | ICD-10-CM | POA: Diagnosis not present

## 2023-01-11 DIAGNOSIS — Z170 Estrogen receptor positive status [ER+]: Secondary | ICD-10-CM | POA: Diagnosis not present

## 2023-01-11 DIAGNOSIS — R748 Abnormal levels of other serum enzymes: Secondary | ICD-10-CM | POA: Diagnosis not present

## 2023-01-11 DIAGNOSIS — M8589 Other specified disorders of bone density and structure, multiple sites: Secondary | ICD-10-CM | POA: Diagnosis not present

## 2023-01-11 DIAGNOSIS — Z79811 Long term (current) use of aromatase inhibitors: Secondary | ICD-10-CM | POA: Diagnosis not present

## 2023-01-12 ENCOUNTER — Other Ambulatory Visit (HOSPITAL_BASED_OUTPATIENT_CLINIC_OR_DEPARTMENT_OTHER): Payer: Self-pay

## 2023-01-12 ENCOUNTER — Other Ambulatory Visit: Payer: Self-pay

## 2023-01-13 ENCOUNTER — Other Ambulatory Visit: Payer: Self-pay

## 2023-01-14 ENCOUNTER — Other Ambulatory Visit (HOSPITAL_BASED_OUTPATIENT_CLINIC_OR_DEPARTMENT_OTHER): Payer: Self-pay

## 2023-01-14 ENCOUNTER — Other Ambulatory Visit: Payer: Self-pay | Admitting: Family Medicine

## 2023-01-14 DIAGNOSIS — F419 Anxiety disorder, unspecified: Secondary | ICD-10-CM

## 2023-01-14 DIAGNOSIS — C50311 Malignant neoplasm of lower-inner quadrant of right female breast: Secondary | ICD-10-CM | POA: Diagnosis not present

## 2023-01-14 MED ORDER — LORAZEPAM 0.5 MG PO TABS
0.2500 mg | ORAL_TABLET | Freq: Every day | ORAL | 0 refills | Status: DC | PRN
  Filled 2023-01-14 – 2023-03-07 (×2): qty 30, 30d supply, fill #0

## 2023-01-26 ENCOUNTER — Other Ambulatory Visit (HOSPITAL_BASED_OUTPATIENT_CLINIC_OR_DEPARTMENT_OTHER): Payer: Self-pay

## 2023-02-08 ENCOUNTER — Other Ambulatory Visit (HOSPITAL_BASED_OUTPATIENT_CLINIC_OR_DEPARTMENT_OTHER): Payer: Self-pay

## 2023-02-08 ENCOUNTER — Other Ambulatory Visit: Payer: Self-pay

## 2023-02-09 ENCOUNTER — Other Ambulatory Visit (HOSPITAL_BASED_OUTPATIENT_CLINIC_OR_DEPARTMENT_OTHER): Payer: Self-pay

## 2023-02-11 DIAGNOSIS — C50311 Malignant neoplasm of lower-inner quadrant of right female breast: Secondary | ICD-10-CM | POA: Diagnosis not present

## 2023-03-07 ENCOUNTER — Other Ambulatory Visit (HOSPITAL_BASED_OUTPATIENT_CLINIC_OR_DEPARTMENT_OTHER): Payer: Self-pay

## 2023-03-08 ENCOUNTER — Other Ambulatory Visit (HOSPITAL_BASED_OUTPATIENT_CLINIC_OR_DEPARTMENT_OTHER): Payer: Self-pay

## 2023-03-09 DIAGNOSIS — C50311 Malignant neoplasm of lower-inner quadrant of right female breast: Secondary | ICD-10-CM | POA: Diagnosis not present

## 2023-03-22 DIAGNOSIS — H5213 Myopia, bilateral: Secondary | ICD-10-CM | POA: Diagnosis not present

## 2023-03-29 DIAGNOSIS — Z79811 Long term (current) use of aromatase inhibitors: Secondary | ICD-10-CM | POA: Diagnosis not present

## 2023-03-29 DIAGNOSIS — R4189 Other symptoms and signs involving cognitive functions and awareness: Secondary | ICD-10-CM | POA: Diagnosis not present

## 2023-03-29 DIAGNOSIS — R4589 Other symptoms and signs involving emotional state: Secondary | ICD-10-CM | POA: Diagnosis not present

## 2023-03-29 DIAGNOSIS — M8589 Other specified disorders of bone density and structure, multiple sites: Secondary | ICD-10-CM | POA: Diagnosis not present

## 2023-03-29 DIAGNOSIS — R748 Abnormal levels of other serum enzymes: Secondary | ICD-10-CM | POA: Diagnosis not present

## 2023-03-29 DIAGNOSIS — Z170 Estrogen receptor positive status [ER+]: Secondary | ICD-10-CM | POA: Diagnosis not present

## 2023-03-29 DIAGNOSIS — C50011 Malignant neoplasm of nipple and areola, right female breast: Secondary | ICD-10-CM | POA: Diagnosis not present

## 2023-04-06 ENCOUNTER — Other Ambulatory Visit (HOSPITAL_BASED_OUTPATIENT_CLINIC_OR_DEPARTMENT_OTHER): Payer: Self-pay

## 2023-04-07 ENCOUNTER — Other Ambulatory Visit (HOSPITAL_BASED_OUTPATIENT_CLINIC_OR_DEPARTMENT_OTHER): Payer: Self-pay

## 2023-04-07 DIAGNOSIS — C50311 Malignant neoplasm of lower-inner quadrant of right female breast: Secondary | ICD-10-CM | POA: Diagnosis not present

## 2023-04-18 ENCOUNTER — Other Ambulatory Visit (HOSPITAL_BASED_OUTPATIENT_CLINIC_OR_DEPARTMENT_OTHER): Payer: Self-pay

## 2023-04-19 ENCOUNTER — Other Ambulatory Visit (HOSPITAL_BASED_OUTPATIENT_CLINIC_OR_DEPARTMENT_OTHER): Payer: Self-pay

## 2023-05-05 ENCOUNTER — Other Ambulatory Visit (HOSPITAL_BASED_OUTPATIENT_CLINIC_OR_DEPARTMENT_OTHER): Payer: Self-pay

## 2023-05-06 ENCOUNTER — Other Ambulatory Visit (HOSPITAL_BASED_OUTPATIENT_CLINIC_OR_DEPARTMENT_OTHER): Payer: Self-pay

## 2023-05-06 DIAGNOSIS — C50311 Malignant neoplasm of lower-inner quadrant of right female breast: Secondary | ICD-10-CM | POA: Diagnosis not present

## 2023-06-01 ENCOUNTER — Other Ambulatory Visit (HOSPITAL_BASED_OUTPATIENT_CLINIC_OR_DEPARTMENT_OTHER): Payer: Self-pay

## 2023-06-01 ENCOUNTER — Other Ambulatory Visit: Payer: Self-pay

## 2023-06-02 ENCOUNTER — Other Ambulatory Visit (HOSPITAL_BASED_OUTPATIENT_CLINIC_OR_DEPARTMENT_OTHER): Payer: Self-pay

## 2023-06-02 ENCOUNTER — Other Ambulatory Visit: Payer: Self-pay

## 2023-06-03 ENCOUNTER — Other Ambulatory Visit: Payer: Commercial Managed Care - PPO

## 2023-06-03 ENCOUNTER — Other Ambulatory Visit: Payer: Self-pay

## 2023-06-03 DIAGNOSIS — K900 Celiac disease: Secondary | ICD-10-CM | POA: Diagnosis not present

## 2023-06-03 DIAGNOSIS — C50311 Malignant neoplasm of lower-inner quadrant of right female breast: Secondary | ICD-10-CM | POA: Diagnosis not present

## 2023-06-07 LAB — CELIAC PANEL 10
Antigliadin Abs, IgA: 13 units (ref 0–19)
Endomysial IgA: NEGATIVE
Gliadin IgG: 5 units (ref 0–19)
IgA/Immunoglobulin A, Serum: 141 mg/dL (ref 87–352)
Tissue Transglut Ab: 3 U/mL (ref 0–5)
Transglutaminase IgA: <2 U/mL (ref 0–3)

## 2023-06-14 ENCOUNTER — Other Ambulatory Visit (HOSPITAL_BASED_OUTPATIENT_CLINIC_OR_DEPARTMENT_OTHER): Payer: Self-pay

## 2023-06-17 ENCOUNTER — Encounter: Payer: Self-pay | Admitting: Gastroenterology

## 2023-07-01 ENCOUNTER — Other Ambulatory Visit (HOSPITAL_BASED_OUTPATIENT_CLINIC_OR_DEPARTMENT_OTHER): Payer: Self-pay

## 2023-07-03 ENCOUNTER — Other Ambulatory Visit (HOSPITAL_BASED_OUTPATIENT_CLINIC_OR_DEPARTMENT_OTHER): Payer: Self-pay

## 2023-07-04 ENCOUNTER — Other Ambulatory Visit (HOSPITAL_BASED_OUTPATIENT_CLINIC_OR_DEPARTMENT_OTHER): Payer: Self-pay

## 2023-07-04 DIAGNOSIS — C50311 Malignant neoplasm of lower-inner quadrant of right female breast: Secondary | ICD-10-CM | POA: Diagnosis not present

## 2023-07-19 NOTE — Progress Notes (Unsigned)
HPI: Tabitha Brown is a 42 y.o. female, who is here today for her routine physical.  Last CPE: 03/26/22  Regular exercise 3 or more time per week: *** Following a healthy diet: ***  Chronic medical problems: Right breast cancer on ovarian suppression therapy, anxiety, IBS, and cervical radiculopathy among some.   Immunization History  Administered Date(s) Administered   COVID-19, mRNA, vaccine(Comirnaty)12 years and older 11/24/2022   Influenza,inj,Quad PF,6+ Mos 09/21/2019, 10/21/2020   Influenza-Unspecified 09/21/2019, 10/23/2020   Moderna Sars-Covid-2 Vaccination 12/11/2019, 01/14/2020, 07/31/2020   Tdap 11/23/2016   Health Maintenance  Topic Date Due   PAP SMEAR-Modifier  10/27/2018   COVID-19 Vaccine (5 - 2023-24 season) 01/19/2023   INFLUENZA VACCINE  07/14/2023   Colonoscopy  07/08/2025   DTaP/Tdap/Td (2 - Td or Tdap) 11/23/2026   Hepatitis C Screening  Completed   HIV Screening  Completed   HPV VACCINES  Aged Out    She has *** concerns today.  Review of Systems  Current Outpatient Medications on File Prior to Visit  Medication Sig Dispense Refill   albuterol (VENTOLIN HFA) 108 (90 Base) MCG/ACT inhaler Inhale 2 puffs into the lungs every 6 (six) hours as needed for wheezing or shortness of breath. (Patient not taking: Reported on 09/15/2022) 8 g 2   ascorbic acid (VITAMIN C) 500 MG tablet Take 500 mg by mouth daily. (Patient not taking: Reported on 09/15/2022)     BIOTIN PO Take by mouth. (Patient not taking: Reported on 09/15/2022)     cephALEXin (KEFLEX) 500 MG capsule Take 2 capsules by mouth every 12 hours; Start after surgery and FINISH entire prescription 28 capsule 1   cholecalciferol (VITAMIN D3) 25 MCG (1000 UNIT) tablet Take 1,000 Units by mouth daily.     COVID-19 At Home Antigen Test [facility] COVID-19 HOME TEST) KIT Use as directed (Patient not taking: Reported on 09/15/2022) 4 each 0   COVID-19 At Home Antigen Test ([facility] COVID-19 HOME TEST)  KIT Use as directed per package instructions (Patient not taking: Reported on 09/15/2022) 4 each 0   Cyanocobalamin (VITAMIN B-12 PO) Take by mouth. (Patient not taking: Reported on 09/15/2022)     Efinaconazole 10 % SOLN Apply 1 drop topically at bedtime. A drop onto and end of a dry toenail,rub around nail and surrounding. 8 mL ML   escitalopram (LEXAPRO) 10 MG tablet Take 0.5 tablet by mouth once daily. (Patient not taking: Reported on 09/15/2022) 45 tablet 3   escitalopram (LEXAPRO) 10 MG tablet Take 0.5 tablets (5 mg total) by mouth once daily 45 tablet 3   exemestane (AROMASIN) 25 MG tablet Take 1 tablet (25 mg total) by mouth once daily for 90 days (Patient not taking: Reported on 09/15/2022) 30 tablet 2   fluticasone (FLOVENT HFA) 110 MCG/ACT inhaler Inhale 2 puffs into the lungs in the morning and at bedtime. (Patient not taking: Reported on 09/15/2022) 1 each 3   HYDROcodone-acetaminophen (NORCO/VICODIN) 5-325 MG tablet Take 1 tablet by mouth every 4-6 hours as needed for pain; Take as directed for post-surgical pain 42 tablet 0   ibuprofen (ADVIL) 600 MG tablet Take 1 tablet (600 mg total) by mouth every 6 (six) hours. (Patient not taking: Reported on 09/15/2022) 30 tablet 0   letrozole (FEMARA) 2.5 MG tablet Take 2.5 mg by mouth daily. (Patient not taking: Reported on 07/08/2022)     letrozole (FEMARA) 2.5 MG tablet Take 1 tablet (2.5 mg total) by mouth once daily 90 tablet 3  leuprolide (LUPRON DEPOT, 52-MONTH,) 3.75 MG injection Inject 3.75 mg into the muscle every 30 (thirty) days. 1 kit 11   Leuprolide Acetate (LUPRON DEPOT, 52-MONTH, IM) Inject into the muscle.     LORazepam (ATIVAN) 0.5 MG tablet Take 0.5-1 tablets (0.25-0.5 mg total) by mouth daily as needed for anxiety. 30 tablet 0   Multiple Vitamin (MULTIVITAMIN ADULT PO) Take by mouth. (Patient not taking: Reported on 09/15/2022)     nystatin-triamcinolone (MYCOLOG II) cream APPLY TO THE EXTERNAL AFFECTED AREAS TWICE A DAY AS NEEDED 60 g  3   oxybutynin (DITROPAN) 5 MG tablet Take 1/2 tablets (2.5 mg total) by mouth 2 (two) times daily (Patient not taking: Reported on 09/15/2022) 30 tablet 11   promethazine (PHENERGAN) 25 MG tablet Take 0.5-1 tablets by mouth every 6-8 hours as needed for nausea; May cause drowsiness 10 tablet 1   triamcinolone (KENALOG) 0.025 % cream Apply to the affected area twice daily prn. (Patient not taking: Reported on 09/15/2022) 80 g 0   Current Facility-Administered Medications on File Prior to Visit  Medication Dose Route Frequency Provider Last Rate Last Admin   0.9 %  sodium chloride infusion  500 mL Intravenous Once Armbruster, Willaim Rayas, MD        Past Medical History:  Diagnosis Date   Breast mass 07/26/2017   Right breast lump x 1 month    Cancer (HCC)    Celiac disease    Eating disorder    Family history of cholangiocarcinoma    PONV (postoperative nausea and vomiting)    hypotensive passes out after epidurals   right breast ca    diagnosed 09/18/19   UTI (urinary tract infection)     Past Surgical History:  Procedure Laterality Date   APPENDECTOMY     AUGMENTATION MAMMAPLASTY Bilateral 2010   BREAST AUGMENTATION  2010   BREAST LUMPECTOMY WITH RADIOACTIVE SEED AND SENTINEL LYMPH NODE BIOPSY Right 10/18/2019   Procedure: RIGHT BREAST LUMPECTOMY WITH RADIOACTIVE SEED AND RIGHT AXILLARY SENTINEL LYMPH NODE BIOPSY;  Surgeon: Emelia Loron, MD;  Location: [facility] OR;  Service: General;  Laterality: Right;   RE-EXCISION OF BREAST CANCER,SUPERIOR MARGINS Right 11/01/2019   Procedure: RE-EXCISION OF RIGHT BREAST CANCER MARGINS;  Surgeon: Emelia Loron, MD;  Location: [facility];  Service: General;  Laterality: Right;    Allergies  Allergen Reactions   Sulfa Antibiotics Hives   Sulfonylureas     Family History  Problem Relation Age of Onset   Alcohol abuse Maternal Grandfather        bile duct cancer   Heart disease Maternal Grandfather    Cancer Maternal  Grandfather 89       bile duct cancer   Hypertension Maternal Grandfather    Hypertension Paternal Grandfather    Heart disease Paternal Grandfather    Colon cancer Neg Hx    Stomach cancer Neg Hx    Esophageal cancer Neg Hx     Social History   Socioeconomic History   Marital status: Married    Spouse name: Amelie Bord   Number of children: 2   Years of education: Not on file   Highest education level: Not on file  Occupational History   Occupation: dentist  Tobacco Use   Smoking status: Never   Smokeless tobacco: Never  Vaping Use   Vaping status: Never Used  Substance and Sexual Activity   Alcohol use: Yes    Comment: 1 glass of wine a wk   Drug use:  No   Sexual activity: Yes  Other Topics Concern   Not on file  Social History Narrative   Not on file   Social Determinants of Health   Financial Resource Strain: Not on file  Food Insecurity: Not on file  Transportation Needs: Not on file  Physical Activity: Not on file  Stress: Not on file  Social Connections: Unknown (04/25/2022)   Received from [facility]   Social Network    Social Network: Not on file    There were no vitals filed for this visit. There is no height or weight on file to calculate BMI.  Wt Readings from Last 3 Encounters:  09/15/22 105 lb (47.6 kg)  07/08/22 105 lb (47.6 kg)  05/14/22 105 lb (47.6 kg)    Physical Exam  ASSESSMENT AND PLAN: Tabitha Brown was here today annual physical examination.  No orders of the defined types were placed in this encounter.   There are no diagnoses linked to this encounter.  There are no diagnoses linked to this encounter.  No follow-ups on file.   G. [place], MD  [facility]. [place] office.

## 2023-07-20 ENCOUNTER — Ambulatory Visit (INDEPENDENT_AMBULATORY_CARE_PROVIDER_SITE_OTHER): Payer: 59 | Admitting: Family Medicine

## 2023-07-20 ENCOUNTER — Encounter: Payer: Self-pay | Admitting: Family Medicine

## 2023-07-20 VITALS — BP 100/70 | HR 64 | Temp 97.2°F | Resp 12 | Ht 61.0 in | Wt 110.5 lb

## 2023-07-20 DIAGNOSIS — C50311 Malignant neoplasm of lower-inner quadrant of right female breast: Secondary | ICD-10-CM | POA: Diagnosis not present

## 2023-07-20 DIAGNOSIS — Z0000 Encounter for general adult medical examination without abnormal findings: Secondary | ICD-10-CM

## 2023-07-20 DIAGNOSIS — Z170 Estrogen receptor positive status [ER+]: Secondary | ICD-10-CM | POA: Diagnosis not present

## 2023-07-20 DIAGNOSIS — F419 Anxiety disorder, unspecified: Secondary | ICD-10-CM

## 2023-07-20 MED ORDER — ESCITALOPRAM OXALATE 5 MG PO TABS: 5.0000 mg | ORAL_TABLET | Freq: Every day | ORAL | Status: DC

## 2023-07-20 NOTE — Assessment & Plan Note (Signed)
We discussed the importance of regular physical activity and healthy diet for prevention of chronic illness and/or complications. Preventive guidelines reviewed. Vaccination up to date. Next CPE in a year. 

## 2023-07-20 NOTE — Assessment & Plan Note (Signed)
On Lupron q 4 weeks. Follows with oncologist.

## 2023-07-20 NOTE — Patient Instructions (Addendum)
A few things to remember from today's visit:  Routine general medical examination at a health care facility  Anxiety disorder, unspecified type Alternate Lexapro between 5 mg 2.5 mg daily for 2 weeks, then 2.5 mg for 2 weeks, then 2.5 mg every other day for 2 weeks, then every 3rd day for 2 weeks and stop.  If you need refills for medications you take chronically, please call your pharmacy. Do not use My Chart to request refills or for acute issues that need immediate attention. If you send a my chart message, it may take a few days to be addressed, specially if I am not in the office.  Please be sure medication list is accurate. If a new problem present, please set up appointment sooner than planned today.  Health Maintenance, Female Adopting a healthy lifestyle and getting preventive care are important in promoting health and wellness. Ask your health care provider about: The right schedule for you to have regular tests and exams. Things you can do on your own to prevent diseases and keep yourself healthy. What should I know about diet, weight, and exercise? Eat a healthy diet  Eat a diet that includes plenty of vegetables, fruits, low-fat dairy products, and lean protein. Do not eat a lot of foods that are high in solid fats, added sugars, or sodium. Maintain a healthy weight Body mass index (BMI) is used to identify weight problems. It estimates body fat based on height and weight. Your health care provider can help determine your BMI and help you achieve or maintain a healthy weight. Get regular exercise Get regular exercise. This is one of the most important things you can do for your health. Most adults should: Exercise for at least 150 minutes each week. The exercise should increase your heart rate and make you sweat (moderate-intensity exercise). Do strengthening exercises at least twice a week. This is in addition to the moderate-intensity exercise. Spend less time sitting.  Even light physical activity can be beneficial. Watch cholesterol and blood lipids Have your blood tested for lipids and cholesterol at 43 years of age, then have this test every 5 years. Have your cholesterol levels checked more often if: Your lipid or cholesterol levels are high. You are older than 43 years of age. You are at high risk for heart disease. What should I know about cancer screening? Depending on your health history and family history, you may need to have cancer screening at various ages. This may include screening for: Breast cancer. Cervical cancer. Colorectal cancer. Skin cancer. Lung cancer. What should I know about heart disease, diabetes, and high blood pressure? Blood pressure and heart disease High blood pressure causes heart disease and increases the risk of stroke. This is more likely to develop in people who have high blood pressure readings or are overweight. Have your blood pressure checked: Every 3-5 years if you are 28-19 years of age. Every year if you are 41 years old or older. Diabetes Have regular diabetes screenings. This checks your fasting blood sugar level. Have the screening done: Once every three years after age 72 if you are at a normal weight and have a low risk for diabetes. More often and at a younger age if you are overweight or have a high risk for diabetes. What should I know about preventing infection? Hepatitis B If you have a higher risk for hepatitis B, you should be screened for this virus. Talk with your health care provider to find out if you  are at risk for hepatitis B infection. Hepatitis C Testing is recommended for: Everyone born from [birth year] through 1966. Anyone with known risk factors for hepatitis C. Sexually transmitted infections (STIs) Get screened for STIs, including gonorrhea and chlamydia, if: You are sexually active and are younger than 43 years of age. You are older than 43 years of age and your health care provider  tells you that you are at risk for this type of infection. Your sexual activity has changed since you were last screened, and you are at increased risk for chlamydia or gonorrhea. Ask your health care provider if you are at risk. Ask your health care provider about whether you are at high risk for HIV. Your health care provider may recommend a prescription medicine to help prevent HIV infection. If you choose to take medicine to prevent HIV, you should first get tested for HIV. You should then be tested every 3 months for as long as you are taking the medicine. Pregnancy If you are about to stop having your period (premenopausal) and you may become pregnant, seek counseling before you get pregnant. Take 400 to 800 micrograms (mcg) of folic acid every day if you become pregnant. Ask for birth control (contraception) if you want to prevent pregnancy. Osteoporosis and menopause Osteoporosis is a disease in which the bones lose minerals and strength with aging. This can result in bone fractures. If you are 32 years old or older, or if you are at risk for osteoporosis and fractures, ask your health care provider if you should: Be screened for bone loss. Take a calcium or vitamin D supplement to lower your risk of fractures. Be given hormone replacement therapy (HRT) to treat symptoms of menopause. Follow these instructions at home: Alcohol use Do not drink alcohol if: Your health care provider tells you not to drink. You are pregnant, may be pregnant, or are planning to become pregnant. If you drink alcohol: Limit how much you have to: 0-1 drink a day. Know how much alcohol is in your drink. In the [place] one drink equals one 12 oz bottle of beer (355 mL), one 5 oz glass of wine (148 mL), or one 1 oz glass of hard liquor (44 mL). Lifestyle Do not use any products that contain nicotine or tobacco. These products include cigarettes, chewing tobacco, and vaping devices, such as e-cigarettes. If you  need help quitting, ask your health care provider. Do not use street drugs. Do not share needles. Ask your health care provider for help if you need support or information about quitting drugs. General instructions Schedule regular health, dental, and eye exams. Stay current with your vaccines. Tell your health care provider if: You often feel depressed. You have ever been abused or do not feel safe at home. Summary Adopting a healthy lifestyle and getting preventive care are important in promoting health and wellness. Follow your health care provider's instructions about healthy diet, exercising, and getting tested or screened for diseases. Follow your health care provider's instructions on monitoring your cholesterol and blood pressure. This information is not intended to replace advice given to you by your health care provider. Make sure you discuss any questions you have with your health care provider. Document Revised: 04/20/2021 Document Reviewed: 04/20/2021 Elsevier Patient Education  2024 [employer].

## 2023-07-20 NOTE — Assessment & Plan Note (Signed)
Currently she is on Lexapro 5 mg, has been on medication for about 3 years. She would like to start weaning off medication, instructions given on AVS. If symptoms reoccur ,she was instructed to resume medication. Continue Alprazolam 0.25 mg daily as needed, she does not need a refill at this time.

## 2023-07-26 ENCOUNTER — Other Ambulatory Visit (HOSPITAL_BASED_OUTPATIENT_CLINIC_OR_DEPARTMENT_OTHER): Payer: Self-pay

## 2023-07-26 DIAGNOSIS — Z170 Estrogen receptor positive status [ER+]: Secondary | ICD-10-CM | POA: Diagnosis not present

## 2023-07-26 DIAGNOSIS — Z9889 Other specified postprocedural states: Secondary | ICD-10-CM | POA: Diagnosis not present

## 2023-07-26 DIAGNOSIS — Z9011 Acquired absence of right breast and nipple: Secondary | ICD-10-CM | POA: Diagnosis not present

## 2023-07-26 DIAGNOSIS — C50011 Malignant neoplasm of nipple and areola, right female breast: Secondary | ICD-10-CM | POA: Diagnosis not present

## 2023-07-26 DIAGNOSIS — Z1239 Encounter for other screening for malignant neoplasm of breast: Secondary | ICD-10-CM | POA: Diagnosis not present

## 2023-07-27 ENCOUNTER — Other Ambulatory Visit (HOSPITAL_BASED_OUTPATIENT_CLINIC_OR_DEPARTMENT_OTHER): Payer: Self-pay

## 2023-07-27 ENCOUNTER — Other Ambulatory Visit: Payer: Self-pay

## 2023-07-29 DIAGNOSIS — C50311 Malignant neoplasm of lower-inner quadrant of right female breast: Secondary | ICD-10-CM | POA: Diagnosis not present

## 2023-08-08 ENCOUNTER — Encounter: Payer: Self-pay | Admitting: Family Medicine

## 2023-08-12 ENCOUNTER — Other Ambulatory Visit (HOSPITAL_BASED_OUTPATIENT_CLINIC_OR_DEPARTMENT_OTHER): Payer: Self-pay

## 2023-08-12 ENCOUNTER — Other Ambulatory Visit: Payer: Self-pay | Admitting: Family Medicine

## 2023-08-12 MED ORDER — CYCLOBENZAPRINE HCL 5 MG PO TABS
5.0000 mg | ORAL_TABLET | Freq: Every evening | ORAL | 1 refills | Status: DC | PRN
  Filled 2023-08-12 – 2023-08-25 (×2): qty 30, 15d supply, fill #0

## 2023-08-22 ENCOUNTER — Other Ambulatory Visit: Payer: Self-pay

## 2023-08-25 ENCOUNTER — Other Ambulatory Visit (HOSPITAL_BASED_OUTPATIENT_CLINIC_OR_DEPARTMENT_OTHER): Payer: Self-pay

## 2023-08-26 DIAGNOSIS — C50311 Malignant neoplasm of lower-inner quadrant of right female breast: Secondary | ICD-10-CM | POA: Diagnosis not present

## 2023-09-10 ENCOUNTER — Other Ambulatory Visit (HOSPITAL_BASED_OUTPATIENT_CLINIC_OR_DEPARTMENT_OTHER): Payer: Self-pay

## 2023-09-13 ENCOUNTER — Other Ambulatory Visit (HOSPITAL_BASED_OUTPATIENT_CLINIC_OR_DEPARTMENT_OTHER): Payer: Self-pay

## 2023-09-13 DIAGNOSIS — C50011 Malignant neoplasm of nipple and areola, right female breast: Secondary | ICD-10-CM | POA: Diagnosis not present

## 2023-09-13 DIAGNOSIS — Z170 Estrogen receptor positive status [ER+]: Secondary | ICD-10-CM | POA: Diagnosis not present

## 2023-09-13 DIAGNOSIS — M8580 Other specified disorders of bone density and structure, unspecified site: Secondary | ICD-10-CM | POA: Diagnosis not present

## 2023-09-13 DIAGNOSIS — R232 Flushing: Secondary | ICD-10-CM | POA: Diagnosis not present

## 2023-09-13 DIAGNOSIS — N644 Mastodynia: Secondary | ICD-10-CM | POA: Diagnosis not present

## 2023-09-13 DIAGNOSIS — Z9011 Acquired absence of right breast and nipple: Secondary | ICD-10-CM | POA: Diagnosis not present

## 2023-09-13 DIAGNOSIS — R748 Abnormal levels of other serum enzymes: Secondary | ICD-10-CM | POA: Diagnosis not present

## 2023-09-13 DIAGNOSIS — F419 Anxiety disorder, unspecified: Secondary | ICD-10-CM | POA: Diagnosis not present

## 2023-09-13 DIAGNOSIS — Z9889 Other specified postprocedural states: Secondary | ICD-10-CM | POA: Diagnosis not present

## 2023-09-13 DIAGNOSIS — Z79811 Long term (current) use of aromatase inhibitors: Secondary | ICD-10-CM | POA: Diagnosis not present

## 2023-09-13 MED ORDER — SERTRALINE HCL 25 MG PO TABS
25.0000 mg | ORAL_TABLET | Freq: Every day | ORAL | 0 refills | Status: DC
  Filled 2023-09-13: qty 30, 30d supply, fill #0

## 2023-09-19 ENCOUNTER — Other Ambulatory Visit (HOSPITAL_BASED_OUTPATIENT_CLINIC_OR_DEPARTMENT_OTHER): Payer: Self-pay

## 2023-09-19 ENCOUNTER — Other Ambulatory Visit: Payer: Self-pay

## 2023-09-21 ENCOUNTER — Other Ambulatory Visit (HOSPITAL_BASED_OUTPATIENT_CLINIC_OR_DEPARTMENT_OTHER): Payer: Self-pay

## 2023-09-22 ENCOUNTER — Other Ambulatory Visit (HOSPITAL_BASED_OUTPATIENT_CLINIC_OR_DEPARTMENT_OTHER): Payer: Self-pay

## 2023-09-22 MED ORDER — AMOXICILLIN 500 MG PO CAPS
500.0000 mg | ORAL_CAPSULE | Freq: Three times a day (TID) | ORAL | 0 refills | Status: DC
  Filled 2023-09-22: qty 12, 4d supply, fill #0

## 2023-09-23 ENCOUNTER — Other Ambulatory Visit (HOSPITAL_BASED_OUTPATIENT_CLINIC_OR_DEPARTMENT_OTHER): Payer: Self-pay

## 2023-09-23 DIAGNOSIS — C50919 Malignant neoplasm of unspecified site of unspecified female breast: Secondary | ICD-10-CM | POA: Diagnosis not present

## 2023-09-23 MED ORDER — LUPRON DEPOT (1-MONTH) 3.75 MG IM KIT
3.7500 mg | PACK | INTRAMUSCULAR | 2 refills | Status: DC
  Filled 2023-09-23 – 2023-10-17 (×3): qty 1, 30d supply, fill #0
  Filled 2023-11-15: qty 1, 30d supply, fill #1
  Filled 2023-12-12 – 2023-12-15 (×2): qty 1, 30d supply, fill #2

## 2023-10-15 ENCOUNTER — Other Ambulatory Visit (HOSPITAL_BASED_OUTPATIENT_CLINIC_OR_DEPARTMENT_OTHER): Payer: Self-pay

## 2023-10-15 ENCOUNTER — Encounter (HOSPITAL_BASED_OUTPATIENT_CLINIC_OR_DEPARTMENT_OTHER): Payer: Self-pay

## 2023-10-16 ENCOUNTER — Other Ambulatory Visit (HOSPITAL_BASED_OUTPATIENT_CLINIC_OR_DEPARTMENT_OTHER): Payer: Self-pay

## 2023-10-17 ENCOUNTER — Other Ambulatory Visit: Payer: Self-pay

## 2023-10-17 ENCOUNTER — Other Ambulatory Visit (HOSPITAL_BASED_OUTPATIENT_CLINIC_OR_DEPARTMENT_OTHER): Payer: Self-pay

## 2023-10-18 ENCOUNTER — Other Ambulatory Visit: Payer: Self-pay

## 2023-10-20 ENCOUNTER — Other Ambulatory Visit (HOSPITAL_BASED_OUTPATIENT_CLINIC_OR_DEPARTMENT_OTHER): Payer: Self-pay

## 2023-10-21 DIAGNOSIS — C50311 Malignant neoplasm of lower-inner quadrant of right female breast: Secondary | ICD-10-CM | POA: Diagnosis not present

## 2023-10-31 ENCOUNTER — Other Ambulatory Visit (HOSPITAL_COMMUNITY): Payer: Self-pay

## 2023-11-15 ENCOUNTER — Other Ambulatory Visit (HOSPITAL_BASED_OUTPATIENT_CLINIC_OR_DEPARTMENT_OTHER): Payer: Self-pay

## 2023-11-16 DIAGNOSIS — F422 Mixed obsessional thoughts and acts: Secondary | ICD-10-CM | POA: Diagnosis not present

## 2023-11-16 DIAGNOSIS — F900 Attention-deficit hyperactivity disorder, predominantly inattentive type: Secondary | ICD-10-CM | POA: Diagnosis not present

## 2023-11-17 ENCOUNTER — Other Ambulatory Visit (HOSPITAL_BASED_OUTPATIENT_CLINIC_OR_DEPARTMENT_OTHER): Payer: Self-pay

## 2023-11-17 MED ORDER — VILAZODONE HCL 10 MG PO TABS
10.0000 mg | ORAL_TABLET | Freq: Every day | ORAL | 0 refills | Status: DC
  Filled 2023-11-17: qty 90, 90d supply, fill #0

## 2023-11-18 DIAGNOSIS — C50311 Malignant neoplasm of lower-inner quadrant of right female breast: Secondary | ICD-10-CM | POA: Diagnosis not present

## 2023-11-22 DIAGNOSIS — Z130 Encounter for screening for diseases of the blood and blood-forming organs and certain disorders involving the immune mechanism: Secondary | ICD-10-CM | POA: Diagnosis not present

## 2023-11-22 DIAGNOSIS — N952 Postmenopausal atrophic vaginitis: Secondary | ICD-10-CM | POA: Diagnosis not present

## 2023-11-22 DIAGNOSIS — Z1389 Encounter for screening for other disorder: Secondary | ICD-10-CM | POA: Diagnosis not present

## 2023-11-22 DIAGNOSIS — Z01411 Encounter for gynecological examination (general) (routine) with abnormal findings: Secondary | ICD-10-CM | POA: Diagnosis not present

## 2023-11-29 ENCOUNTER — Other Ambulatory Visit: Payer: Self-pay | Admitting: Family Medicine

## 2023-11-29 ENCOUNTER — Other Ambulatory Visit (HOSPITAL_BASED_OUTPATIENT_CLINIC_OR_DEPARTMENT_OTHER): Payer: Self-pay

## 2023-11-29 DIAGNOSIS — F419 Anxiety disorder, unspecified: Secondary | ICD-10-CM

## 2023-11-30 ENCOUNTER — Other Ambulatory Visit: Payer: Self-pay

## 2023-11-30 ENCOUNTER — Other Ambulatory Visit (HOSPITAL_BASED_OUTPATIENT_CLINIC_OR_DEPARTMENT_OTHER): Payer: Self-pay

## 2023-11-30 MED ORDER — NYSTATIN-TRIAMCINOLONE 100000-0.1 UNIT/GM-% EX CREA
TOPICAL_CREAM | CUTANEOUS | 3 refills | Status: AC
  Filled 2023-11-30 – 2023-12-16 (×2): qty 60, 30d supply, fill #0

## 2023-12-07 MED ORDER — LORAZEPAM 0.5 MG PO TABS
0.2500 mg | ORAL_TABLET | Freq: Every day | ORAL | 0 refills | Status: DC | PRN
Start: 2023-12-07 — End: 2024-09-11
  Filled 2023-12-07 – 2023-12-16 (×2): qty 20, 20d supply, fill #0

## 2023-12-08 ENCOUNTER — Other Ambulatory Visit (HOSPITAL_BASED_OUTPATIENT_CLINIC_OR_DEPARTMENT_OTHER): Payer: Self-pay

## 2023-12-12 ENCOUNTER — Other Ambulatory Visit (HOSPITAL_BASED_OUTPATIENT_CLINIC_OR_DEPARTMENT_OTHER): Payer: Self-pay

## 2023-12-12 MED ORDER — LETROZOLE 2.5 MG PO TABS
2.5000 mg | ORAL_TABLET | Freq: Every day | ORAL | 3 refills | Status: AC
  Filled 2023-12-12: qty 90, 90d supply, fill #0
  Filled 2024-03-05: qty 90, 90d supply, fill #1
  Filled 2024-06-01: qty 90, 90d supply, fill #2
  Filled 2024-08-30: qty 90, 90d supply, fill #3

## 2023-12-15 ENCOUNTER — Other Ambulatory Visit (HOSPITAL_COMMUNITY): Payer: Self-pay

## 2023-12-15 ENCOUNTER — Other Ambulatory Visit (HOSPITAL_BASED_OUTPATIENT_CLINIC_OR_DEPARTMENT_OTHER): Payer: Self-pay

## 2023-12-16 ENCOUNTER — Other Ambulatory Visit (HOSPITAL_BASED_OUTPATIENT_CLINIC_OR_DEPARTMENT_OTHER): Payer: Self-pay

## 2023-12-16 DIAGNOSIS — C50311 Malignant neoplasm of lower-inner quadrant of right female breast: Secondary | ICD-10-CM | POA: Diagnosis not present

## 2023-12-27 DIAGNOSIS — Z9011 Acquired absence of right breast and nipple: Secondary | ICD-10-CM | POA: Diagnosis not present

## 2023-12-27 DIAGNOSIS — C50911 Malignant neoplasm of unspecified site of right female breast: Secondary | ICD-10-CM | POA: Diagnosis not present

## 2023-12-27 DIAGNOSIS — Z9889 Other specified postprocedural states: Secondary | ICD-10-CM | POA: Diagnosis not present

## 2023-12-27 DIAGNOSIS — Z79811 Long term (current) use of aromatase inhibitors: Secondary | ICD-10-CM | POA: Diagnosis not present

## 2023-12-27 DIAGNOSIS — Z170 Estrogen receptor positive status [ER+]: Secondary | ICD-10-CM | POA: Diagnosis not present

## 2023-12-27 DIAGNOSIS — R92343 Mammographic extreme density, bilateral breasts: Secondary | ICD-10-CM | POA: Diagnosis not present

## 2023-12-27 DIAGNOSIS — C50011 Malignant neoplasm of nipple and areola, right female breast: Secondary | ICD-10-CM | POA: Diagnosis not present

## 2024-01-03 ENCOUNTER — Other Ambulatory Visit (HOSPITAL_BASED_OUTPATIENT_CLINIC_OR_DEPARTMENT_OTHER): Payer: Self-pay

## 2024-01-03 DIAGNOSIS — M542 Cervicalgia: Secondary | ICD-10-CM | POA: Diagnosis not present

## 2024-01-03 DIAGNOSIS — M791 Myalgia, unspecified site: Secondary | ICD-10-CM | POA: Diagnosis not present

## 2024-01-03 MED ORDER — MELOXICAM 15 MG PO TABS
15.0000 mg | ORAL_TABLET | Freq: Every day | ORAL | 1 refills | Status: DC | PRN
  Filled 2024-01-03: qty 30, 30d supply, fill #0

## 2024-01-03 MED ORDER — CYCLOBENZAPRINE HCL 5 MG PO TABS
5.0000 mg | ORAL_TABLET | Freq: Every evening | ORAL | 0 refills | Status: DC
  Filled 2024-01-03: qty 30, 30d supply, fill #0

## 2024-01-03 MED ORDER — PREDNISONE 10 MG PO TABS
ORAL_TABLET | ORAL | 0 refills | Status: DC
  Filled 2024-01-03: qty 21, 6d supply, fill #0

## 2024-01-09 ENCOUNTER — Other Ambulatory Visit (HOSPITAL_BASED_OUTPATIENT_CLINIC_OR_DEPARTMENT_OTHER): Payer: Self-pay

## 2024-01-09 ENCOUNTER — Encounter: Payer: Self-pay | Admitting: Family Medicine

## 2024-01-09 MED ORDER — LUPRON DEPOT (1-MONTH) 3.75 MG IM KIT
PACK | INTRAMUSCULAR | 2 refills | Status: DC
  Filled 2024-01-09 – 2024-01-11 (×3): qty 1, 30d supply, fill #0
  Filled 2024-02-06 – 2024-02-08 (×2): qty 1, 30d supply, fill #1
  Filled 2024-03-05 – 2024-03-07 (×2): qty 1, 30d supply, fill #2

## 2024-01-10 ENCOUNTER — Other Ambulatory Visit: Payer: Self-pay | Admitting: Family Medicine

## 2024-01-10 ENCOUNTER — Other Ambulatory Visit (HOSPITAL_BASED_OUTPATIENT_CLINIC_OR_DEPARTMENT_OTHER): Payer: Self-pay

## 2024-01-10 DIAGNOSIS — R202 Paresthesia of skin: Secondary | ICD-10-CM

## 2024-01-11 ENCOUNTER — Other Ambulatory Visit: Payer: Self-pay

## 2024-01-11 ENCOUNTER — Other Ambulatory Visit (HOSPITAL_BASED_OUTPATIENT_CLINIC_OR_DEPARTMENT_OTHER): Payer: Self-pay

## 2024-01-12 ENCOUNTER — Other Ambulatory Visit (HOSPITAL_BASED_OUTPATIENT_CLINIC_OR_DEPARTMENT_OTHER): Payer: Self-pay

## 2024-01-13 DIAGNOSIS — C50311 Malignant neoplasm of lower-inner quadrant of right female breast: Secondary | ICD-10-CM | POA: Diagnosis not present

## 2024-01-18 ENCOUNTER — Other Ambulatory Visit: Payer: Self-pay

## 2024-01-19 ENCOUNTER — Other Ambulatory Visit (HOSPITAL_COMMUNITY): Payer: Self-pay

## 2024-01-19 ENCOUNTER — Other Ambulatory Visit: Payer: Self-pay

## 2024-01-20 ENCOUNTER — Ambulatory Visit (HOSPITAL_BASED_OUTPATIENT_CLINIC_OR_DEPARTMENT_OTHER)
Admission: RE | Admit: 2024-01-20 | Discharge: 2024-01-20 | Disposition: A | Discharge status: Home / Self Care | Payer: 59 | Source: Ambulatory Visit | Attending: Family Medicine | Admitting: Family Medicine

## 2024-01-20 DIAGNOSIS — R202 Paresthesia of skin: Secondary | ICD-10-CM | POA: Insufficient documentation

## 2024-01-20 DIAGNOSIS — R200 Anesthesia of skin: Secondary | ICD-10-CM | POA: Diagnosis not present

## 2024-01-20 DIAGNOSIS — M50221 Other cervical disc displacement at C4-C5 level: Secondary | ICD-10-CM | POA: Diagnosis not present

## 2024-01-20 DIAGNOSIS — M50321 Other cervical disc degeneration at C4-C5 level: Secondary | ICD-10-CM | POA: Diagnosis not present

## 2024-01-20 DIAGNOSIS — M50223 Other cervical disc displacement at C6-C7 level: Secondary | ICD-10-CM | POA: Diagnosis not present

## 2024-01-20 DIAGNOSIS — S0990XA Unspecified injury of head, initial encounter: Secondary | ICD-10-CM | POA: Diagnosis not present

## 2024-01-20 DIAGNOSIS — G379 Demyelinating disease of central nervous system, unspecified: Secondary | ICD-10-CM | POA: Diagnosis not present

## 2024-01-20 DIAGNOSIS — M4802 Spinal stenosis, cervical region: Secondary | ICD-10-CM | POA: Diagnosis not present

## 2024-01-25 ENCOUNTER — Encounter: Payer: Self-pay | Admitting: Family Medicine

## 2024-02-01 ENCOUNTER — Other Ambulatory Visit: Payer: 59

## 2024-02-06 ENCOUNTER — Ambulatory Visit (INDEPENDENT_AMBULATORY_CARE_PROVIDER_SITE_OTHER): Payer: 59 | Admitting: Diagnostic Neuroimaging

## 2024-02-06 ENCOUNTER — Encounter: Payer: Self-pay | Admitting: Diagnostic Neuroimaging

## 2024-02-06 ENCOUNTER — Other Ambulatory Visit (HOSPITAL_BASED_OUTPATIENT_CLINIC_OR_DEPARTMENT_OTHER): Payer: Self-pay

## 2024-02-06 VITALS — BP 115/82 | HR 76 | Ht 62.0 in | Wt 110.6 lb

## 2024-02-06 DIAGNOSIS — M6289 Other specified disorders of muscle: Secondary | ICD-10-CM

## 2024-02-06 DIAGNOSIS — R202 Paresthesia of skin: Secondary | ICD-10-CM | POA: Diagnosis not present

## 2024-02-06 DIAGNOSIS — R200 Anesthesia of skin: Secondary | ICD-10-CM | POA: Diagnosis not present

## 2024-02-06 NOTE — Progress Notes (Signed)
 [facility]  PATIENT: Tabitha Brown DOB: [birth year]  REFERRING CLINICIAN: [place], Betty G, MD HISTORY FROM: patient REASON FOR VISIT: new consult   HISTORICAL  CHIEF COMPLAINT:  Chief Complaint  Patient presents with   New Patient (Initial Visit)    Pt in room 6 alone. New patient internal referral for numbness and tingling. Pt history of right breast cancer noticed numbness and tingling then in 2022. Pt said right leg has numbness/tingling and right arm, left hand now has numbness.     HISTORY OF PRESENT ILLNESS:   43 year old female here for evaluation of numbness and tingling.  Patient was diagnosed with right sided breast cancer (DCIS, stage IA) in 2020, after palpating a mass, mammogram confirmation, and biopsy followed by lumpectomy.  This was diagnosed during pregnancy.  She underwent mastectomy in January 2021.  She is being treated with lupron and letrozole.   She had addl reconstructive surgery in December 2021.  Postoperatively patient noticed some intermittent numbness sensation in her right face, right arm and right leg.  Symptoms were intermittent lasting a week or 2 at a time.  The arm and leg numbness lasted for about 3 months and then resolved.  The right face numbness continued to recur for 6 or 7 months and finally resolved.  She saw neurologist at [facility] who did MRI of the brain with trigeminal nerve protocol which was unremarkable.  Patient was doing well until December 10, 2023 when she was rockclimbing with family when she had an injury in her upper back region between her shoulder blades.  She was treated with some medications for symptom control.  She was started on a steroid Dosepak but after a few days noticed onset of numbness and burning sensation in the right face, with numbness in the right arm and right leg.  She had noted some burning sensation in her right tongue and right eyelid region.  Patient also felt tight sensations in her  right leg and right arm.  Just recently she noticed some numbness in her left hand fifth digit.  Has recently noted some right flank tightness sensation which has resolved.  Patient had also been using a new sauna since December 2024, and was concerned that this might be exacerbating symptoms.  She stopped using sauna in the last week or 2 and symptoms are slightly improving.  Patient was concerned about muscle relaxer contributing to symptoms, and she is planning to stop this to see if this could help.  Due to constellation of symptoms and concern for autoimmune, inflammatory or demyelinating conditions, PCP ordered MRI of the brain and cervical spine to rule out other causes.  This was reviewed in office today and is unremarkable.  Of note the scans were done without contrast.  Patient does have some history of tension type headaches with pain discomfort in her bilateral suboccipital regions.  Denies history of migraine headaches with nausea, sensitive to light or sound, aura.  Family history of migraine noted in mother and sister.     REVIEW OF SYSTEMS: Full 14 system review of systems performed and negative with exception of: as per HPI.  ALLERGIES: Allergies  Allergen Reactions   Sulfa Antibiotics Hives   Sulfonylureas     HOME MEDICATIONS: Outpatient Medications Prior to Visit  Medication Sig Dispense Refill   ascorbic acid (VITAMIN C) 500 MG tablet Take 500 mg by mouth daily.     cholecalciferol (VITAMIN D3) 25 MCG (1000 UNIT) tablet Take 1,000 Units by  mouth daily.     cyclobenzaprine (FLEXERIL) 5 MG tablet Take 1-2 tablets (5-10 mg total) by mouth at bedtime as needed for muscle spasms. 30 tablet 1   cyclobenzaprine (FLEXERIL) 5 MG tablet Take 1 tablet (5 mg total) by mouth at night as needed for pain. 30 tablet 0   letrozole (FEMARA) 2.5 MG tablet Take 2.5 mg by mouth daily.     letrozole (FEMARA) 2.5 MG tablet Take 1 tablet (2.5 mg total) by mouth daily. 90 tablet 3    leuprolide (LUPRON DEPOT, 37-MONTH,) 3.75 MG injection Inject 3.75 mg into the muscle every 30 (thirty) days. 1 each 2   Leuprolide Acetate (LUPRON DEPOT, 37-MONTH, IM) Inject into the muscle.     LORazepam (ATIVAN) 0.5 MG tablet Take 0.5-1 tablets (0.25-0.5 mg total) by mouth daily as needed for anxiety. 20 tablet 0   meloxicam (MOBIC) 15 MG tablet Take 1 tablet (15 mg total) by mouth daily as needed for pain. 30 tablet 1   Multiple Vitamin (MULTIVITAMIN ADULT PO) Take by mouth.     nystatin-triamcinolone (MYCOLOG II) cream APPLY TO THE EXTERNAL AFFECTED AREAS TWICE A DAY AS NEEDED 60 g 3   predniSONE (DELTASONE) 10 MG tablet Take 6 tablets by mouth on day one, 5 tablets on day 2, 4 tablets on day 3, 3 tablets on day 4, 2 tablets on day 5, and 1 tablet on day 6. 21 tablet 0   triamcinolone (KENALOG) 0.025 % cream Apply to the affected area twice daily prn. 80 g 0   amoxicillin (AMOXIL) 500 MG capsule TAKE 1 CAPSULE 3 TIMES DAILY UNTIL GONE. 12 capsule 0   BIOTIN PO Take by mouth.     escitalopram (LEXAPRO) 5 MG tablet Take 1 tablet (5 mg total) by mouth daily.     sertraline (ZOLOFT) 25 MG tablet Take 1 tablet (25 mg total) by mouth once daily (Patient not taking: Reported on 02/06/2024) 30 tablet 0   Vilazodone HCl (VIIBRYD) 10 MG TABS Take 1 tablet (10 mg total) by mouth daily with a full meal. (Patient not taking: Reported on 02/06/2024) 90 tablet 0   No facility-administered medications prior to visit.    PAST MEDICAL HISTORY: Past Medical History:  Diagnosis Date   Breast mass 07/26/2017   Right breast lump x 1 month    Cancer (HCC)    Celiac disease    Eating disorder    Family history of cholangiocarcinoma    PONV (postoperative nausea and vomiting)    hypotensive passes out after epidurals   right breast ca    diagnosed 09/18/19   UTI (urinary tract infection)     PAST SURGICAL HISTORY: Past Surgical History:  Procedure Laterality Date   APPENDECTOMY     AUGMENTATION  MAMMAPLASTY Bilateral 2010   BREAST AUGMENTATION  2010   BREAST LUMPECTOMY WITH RADIOACTIVE SEED AND SENTINEL LYMPH NODE BIOPSY Right 10/18/2019   Procedure: RIGHT BREAST LUMPECTOMY WITH RADIOACTIVE SEED AND RIGHT AXILLARY SENTINEL LYMPH NODE BIOPSY;  Surgeon: Emelia Loron, MD;  Location: [facility] OR;  Service: General;  Laterality: Right;   RE-EXCISION OF BREAST CANCER,SUPERIOR MARGINS Right 11/01/2019   Procedure: RE-EXCISION OF RIGHT BREAST CANCER MARGINS;  Surgeon: Emelia Loron, MD;  Location: [facility];  Service: General;  Laterality: Right;    FAMILY HISTORY: Family History  Problem Relation Age of Onset   Alcohol abuse Maternal Grandfather        bile duct cancer   Heart disease Maternal Grandfather  Cancer Maternal Grandfather 89       bile duct cancer   Hypertension Maternal Grandfather    Hypertension Paternal Grandfather    Heart disease Paternal Grandfather    Colon cancer Neg Hx    Stomach cancer Neg Hx    Esophageal cancer Neg Hx     SOCIAL HISTORY: Social History   Socioeconomic History   Marital status: Married    Spouse name: Lanina Larranaga   Number of children: 2   Years of education: Not on file   Highest education level: Not on file  Occupational History   Occupation: dentist  Tobacco Use   Smoking status: Never   Smokeless tobacco: Never  Vaping Use   Vaping status: Never Used  Substance and Sexual Activity   Alcohol use: Yes    Comment: 1 glass of wine a wk   Drug use: No   Sexual activity: Yes  Other Topics Concern   Not on file  Social History Narrative   Not on file   Social Drivers of Health   Financial Resource Strain: Not on file  Food Insecurity: Not on file  Transportation Needs: Not on file  Physical Activity: Not on file  Stress: Not on file  Social Connections: Unknown (04/25/2022)   Received from [facility]   Social Network    Social Network: Not on file  Intimate Partner Violence:  Unknown (03/18/2022)   Received from [employer], [facility]   HITS    Physically Hurt: Not on file    Insult or Talk Down To: Not on file    Threaten Physical Harm: Not on file    Scream or Curse: Not on file     PHYSICAL EXAM  GENERAL EXAM/CONSTITUTIONAL: Vitals:  Vitals:   02/06/24 1505  BP: 115/82  Pulse: 76  Weight: 110 lb 9.6 oz (50.2 kg)  Height: 5\' 2"  (1.575 m)   Body mass index is 20.23 kg/m. Wt Readings from Last 3 Encounters:  02/06/24 110 lb 9.6 oz (50.2 kg)  07/20/23 110 lb 8 oz (50.1 kg)  09/15/22 105 lb (47.6 kg)   Patient is in no distress; well developed, nourished and groomed; neck is supple  CARDIOVASCULAR: Examination of carotid arteries is normal; no carotid bruits Regular rate and rhythm, no murmurs Examination of peripheral vascular system by observation and palpation is normal  EYES: Ophthalmoscopic exam of optic discs and posterior segments is normal; no papilledema or hemorrhages No results found.  MUSCULOSKELETAL: Gait, strength, tone, movements noted in Neurologic exam below  NEUROLOGIC: MENTAL STATUS:      No data to display         awake, alert, oriented to person, place and time recent and remote memory intact normal attention and concentration language fluent, comprehension intact, naming intact fund of knowledge appropriate  CRANIAL NERVE:  2nd - no papilledema on fundoscopic exam 2nd, 3rd, 4th, 6th - pupils equal and reactive to light, visual fields full to confrontation, extraocular muscles intact, no nystagmus; MILD LEFT PTOSIS 5th - facial sensation symmetric 7th - facial strength symmetric 8th - hearing intact 9th - palate elevates symmetrically, uvula midline 11th - shoulder shrug symmetric 12th - tongue protrusion midline  MOTOR:  normal bulk and tone, full strength in the BUE, BLE  SENSORY:  normal and symmetric to light touch, temperature, vibration  COORDINATION:  finger-nose-finger, fine finger  movements normal  REFLEXES:  deep tendon reflexes present and symmetric; SLIGHTLY BRISK IN KNEES  GAIT/STATION:  narrow based gait     DIAGNOSTIC DATA (LABS, IMAGING, TESTING) - I reviewed patient records, labs, notes, testing and imaging myself where available.  Lab Results  Component Value Date   WBC 9.9 06/07/2020   HGB 10.9 (L) 06/07/2020   HCT 32.1 (L) 06/07/2020   MCV 92.5 06/07/2020   PLT 196 06/07/2020      Component Value Date/Time   NA 140 09/15/2022 1415   K 3.7 09/15/2022 1415   CL 101 09/15/2022 1415   CO2 31 09/15/2022 1415   GLUCOSE 109 (H) 09/15/2022 1415   BUN 13 09/15/2022 1415   CREATININE 0.78 09/15/2022 1415   CREATININE 0.86 09/26/2019 0857   CALCIUM 10.0 09/15/2022 1415   PROT 8.0 09/15/2022 1415   ALBUMIN 5.1 09/15/2022 1415   AST 27 09/15/2022 1415   AST 19 09/26/2019 0857   ALT 24 09/15/2022 1415   ALT 19 09/26/2019 0857   ALKPHOS 130 (H) 09/15/2022 1415   BILITOT 0.5 09/15/2022 1415   BILITOT 0.3 09/26/2019 0857   GFRNONAA >60 09/26/2019 0857   GFRAA >60 09/26/2019 0857   Lab Results  Component Value Date   CHOL 171 03/26/2022   HDL 59.70 03/26/2022   LDLCALC 98 03/26/2022   TRIG 71.0 03/26/2022   CHOLHDL 3 03/26/2022   No results found for: "HGBA1C" Lab Results  Component Value Date   VITAMINB12 977 (H) 09/15/2022   Lab Results  Component Value Date   TSH 2.65 01/31/2018    12/18/2020 MRI brain with and without contrast - No evidence of intracranial metastasis.   04/01/2021 MRI brain with and without contrast - Normal brain MRI with normal appearance of the trigeminal nerves.   01/20/2024 MRI brain without contrast [I reviewed images myself and agree with interpretation. -VRP]  1. Few scattered subcentimeter foci of T2/FLAIR signal abnormality involving the deep and subcortical white matter of the frontal lobes bilaterally, minimally progressed as compared to prior MRI from 02/23/2018, but remain extremely mild in nature.  Overall pattern is nonspecific, with primary differential considerations including changes of chronic microvascular ischemic disease versus sequelae of chronic migraine headaches. Overall appearance would not be typical for demyelinating disease. 2. Otherwise normal brain MRI.  No acute intracranial abnormality.  01/20/2024 MRI cervical spine without contrast [I reviewed images myself and agree with interpretation. -VRP] 1. Normal MRI appearance of the cervical spinal cord. No signal changes to suggest demyelinating disease. 2. Mild noncompressive disc bulging at C4-5 through C6-7 without significant stenosis or neural impingement.    ASSESSMENT AND PLAN  43 y.o. year old female with history of stage Ia breast cancer status post mastectomy, on Lupron and letrozole, here with:   Dx:  1. Numbness and tingling of right face   2. Numbness and tingling of right arm and leg   3. Muscle tightness     PLAN:  Intermittent right face, right arm, right leg numbness, tingling, burning, tightness sensations (initial symptoms in December 2021 lasting 6 to 7 months; similar but slightly different different symptoms starting in December 2024) -Neurologic examination and neuroimaging studies are normal -Unclear cause of symptoms; considerations include autoimmune, inflammatory, vascular, metabolic or migraine associated symptoms; however MRI and lab studies rule out most of these causes -Could consider additional testing including MRI brain with and without contrast with trigeminal nerve protocol, MRI of the thoracic spine with and without contrast, EMG nerve conduction study testing, and additional serum and CSF testing -Symptoms have slightly improved in the last couple weeks.  Recommend to monitor symptoms over the next 1 to 2 weeks for continued resolution.  If symptoms fail to improve or worsen, then may reconsider additional testing.  Return for pending if symptoms worsen or fail to improve.  I  spent 60 minutes of face-to-face and non-face-to-face time with patient.  This included previsit chart review, lab review, study review, order entry, electronic health record documentation, patient education.     Suanne Marker, MD 02/06/2024, 4:05 PM Certified in Neurology, Neurophysiology and Neuroimaging  [facility] [address] [place], [place] [address] [phone]

## 2024-02-08 ENCOUNTER — Other Ambulatory Visit (HOSPITAL_BASED_OUTPATIENT_CLINIC_OR_DEPARTMENT_OTHER): Payer: Self-pay

## 2024-02-09 ENCOUNTER — Other Ambulatory Visit (HOSPITAL_BASED_OUTPATIENT_CLINIC_OR_DEPARTMENT_OTHER): Payer: Self-pay

## 2024-02-10 DIAGNOSIS — C50311 Malignant neoplasm of lower-inner quadrant of right female breast: Secondary | ICD-10-CM | POA: Diagnosis not present

## 2024-02-28 DIAGNOSIS — M546 Pain in thoracic spine: Secondary | ICD-10-CM | POA: Diagnosis not present

## 2024-03-06 ENCOUNTER — Other Ambulatory Visit: Payer: Self-pay

## 2024-03-06 ENCOUNTER — Other Ambulatory Visit (HOSPITAL_BASED_OUTPATIENT_CLINIC_OR_DEPARTMENT_OTHER): Payer: Self-pay

## 2024-03-07 ENCOUNTER — Other Ambulatory Visit (HOSPITAL_BASED_OUTPATIENT_CLINIC_OR_DEPARTMENT_OTHER): Payer: Self-pay

## 2024-03-08 ENCOUNTER — Other Ambulatory Visit (HOSPITAL_BASED_OUTPATIENT_CLINIC_OR_DEPARTMENT_OTHER): Payer: Self-pay

## 2024-03-09 DIAGNOSIS — C50311 Malignant neoplasm of lower-inner quadrant of right female breast: Secondary | ICD-10-CM | POA: Diagnosis not present

## 2024-03-20 ENCOUNTER — Other Ambulatory Visit (HOSPITAL_BASED_OUTPATIENT_CLINIC_OR_DEPARTMENT_OTHER): Payer: Self-pay

## 2024-03-20 DIAGNOSIS — C50011 Malignant neoplasm of nipple and areola, right female breast: Secondary | ICD-10-CM | POA: Diagnosis not present

## 2024-03-20 DIAGNOSIS — Z170 Estrogen receptor positive status [ER+]: Secondary | ICD-10-CM | POA: Diagnosis not present

## 2024-03-20 DIAGNOSIS — C50919 Malignant neoplasm of unspecified site of unspecified female breast: Secondary | ICD-10-CM | POA: Diagnosis not present

## 2024-03-20 MED ORDER — OXYBUTYNIN CHLORIDE 5 MG PO TABS
2.5000 mg | ORAL_TABLET | Freq: Two times a day (BID) | ORAL | 1 refills | Status: DC
  Filled 2024-03-20: qty 15, 15d supply, fill #0

## 2024-03-21 DIAGNOSIS — H5213 Myopia, bilateral: Secondary | ICD-10-CM | POA: Diagnosis not present

## 2024-03-23 ENCOUNTER — Other Ambulatory Visit (HOSPITAL_BASED_OUTPATIENT_CLINIC_OR_DEPARTMENT_OTHER): Payer: Self-pay

## 2024-03-23 MED ORDER — LUPRON DEPOT (1-MONTH) 3.75 MG IM KIT
3.7500 mg | PACK | INTRAMUSCULAR | 0 refills | Status: DC
  Filled 2024-03-23 – 2024-03-28 (×3): qty 1, 21d supply, fill #0

## 2024-03-27 ENCOUNTER — Other Ambulatory Visit (HOSPITAL_BASED_OUTPATIENT_CLINIC_OR_DEPARTMENT_OTHER): Payer: Self-pay

## 2024-03-28 ENCOUNTER — Other Ambulatory Visit (HOSPITAL_BASED_OUTPATIENT_CLINIC_OR_DEPARTMENT_OTHER): Payer: Self-pay

## 2024-03-29 DIAGNOSIS — Z1239 Encounter for other screening for malignant neoplasm of breast: Secondary | ICD-10-CM | POA: Diagnosis not present

## 2024-04-17 ENCOUNTER — Other Ambulatory Visit (HOSPITAL_BASED_OUTPATIENT_CLINIC_OR_DEPARTMENT_OTHER): Payer: Self-pay

## 2024-04-17 MED ORDER — LUPRON DEPOT (1-MONTH) 3.75 MG IM KIT
3.7500 mg | PACK | INTRAMUSCULAR | 0 refills | Status: DC
Start: 2024-04-17 — End: 2024-05-08
  Filled 2024-04-17: qty 1, 21d supply, fill #0

## 2024-04-18 ENCOUNTER — Other Ambulatory Visit (HOSPITAL_BASED_OUTPATIENT_CLINIC_OR_DEPARTMENT_OTHER): Payer: Self-pay

## 2024-04-19 ENCOUNTER — Other Ambulatory Visit (HOSPITAL_BASED_OUTPATIENT_CLINIC_OR_DEPARTMENT_OTHER): Payer: Self-pay

## 2024-04-20 DIAGNOSIS — Z131 Encounter for screening for diabetes mellitus: Secondary | ICD-10-CM | POA: Diagnosis not present

## 2024-04-20 DIAGNOSIS — Z1239 Encounter for other screening for malignant neoplasm of breast: Secondary | ICD-10-CM | POA: Diagnosis not present

## 2024-04-20 DIAGNOSIS — Z853 Personal history of malignant neoplasm of breast: Secondary | ICD-10-CM | POA: Diagnosis not present

## 2024-05-02 DIAGNOSIS — Z170 Estrogen receptor positive status [ER+]: Secondary | ICD-10-CM | POA: Diagnosis not present

## 2024-05-02 DIAGNOSIS — C50011 Malignant neoplasm of nipple and areola, right female breast: Secondary | ICD-10-CM | POA: Diagnosis not present

## 2024-05-08 ENCOUNTER — Other Ambulatory Visit (HOSPITAL_BASED_OUTPATIENT_CLINIC_OR_DEPARTMENT_OTHER): Payer: Self-pay

## 2024-05-08 MED ORDER — LUPRON DEPOT (1-MONTH) 3.75 MG IM KIT
3.7500 mg | PACK | INTRAMUSCULAR | 0 refills | Status: DC
  Filled 2024-05-08: qty 1, 21d supply, fill #0

## 2024-05-11 DIAGNOSIS — Z853 Personal history of malignant neoplasm of breast: Secondary | ICD-10-CM | POA: Diagnosis not present

## 2024-05-16 ENCOUNTER — Other Ambulatory Visit (HOSPITAL_BASED_OUTPATIENT_CLINIC_OR_DEPARTMENT_OTHER): Payer: Self-pay

## 2024-05-16 DIAGNOSIS — Z419 Encounter for procedure for purposes other than remedying health state, unspecified: Secondary | ICD-10-CM | POA: Diagnosis not present

## 2024-05-16 DIAGNOSIS — D225 Melanocytic nevi of trunk: Secondary | ICD-10-CM | POA: Diagnosis not present

## 2024-05-16 DIAGNOSIS — L570 Actinic keratosis: Secondary | ICD-10-CM | POA: Diagnosis not present

## 2024-05-16 DIAGNOSIS — L814 Other melanin hyperpigmentation: Secondary | ICD-10-CM | POA: Diagnosis not present

## 2024-05-16 DIAGNOSIS — L821 Other seborrheic keratosis: Secondary | ICD-10-CM | POA: Diagnosis not present

## 2024-05-16 MED ORDER — TRETINOIN 0.05 % EX CREA
TOPICAL_CREAM | CUTANEOUS | 4 refills | Status: AC
  Filled 2024-05-16 (×2): qty 45, 30d supply, fill #0
  Filled 2024-06-01: qty 45, 90d supply, fill #0

## 2024-05-28 ENCOUNTER — Other Ambulatory Visit (HOSPITAL_BASED_OUTPATIENT_CLINIC_OR_DEPARTMENT_OTHER): Payer: Self-pay

## 2024-05-29 ENCOUNTER — Other Ambulatory Visit (HOSPITAL_BASED_OUTPATIENT_CLINIC_OR_DEPARTMENT_OTHER): Payer: Self-pay

## 2024-05-29 MED ORDER — LUPRON DEPOT (1-MONTH) 3.75 MG IM KIT
3.7500 mg | PACK | INTRAMUSCULAR | 0 refills | Status: DC
  Filled 2024-05-29: qty 1, 21d supply, fill #0

## 2024-05-30 ENCOUNTER — Other Ambulatory Visit (HOSPITAL_BASED_OUTPATIENT_CLINIC_OR_DEPARTMENT_OTHER): Payer: Self-pay

## 2024-06-01 ENCOUNTER — Other Ambulatory Visit (HOSPITAL_BASED_OUTPATIENT_CLINIC_OR_DEPARTMENT_OTHER): Payer: Self-pay

## 2024-06-04 ENCOUNTER — Other Ambulatory Visit (HOSPITAL_BASED_OUTPATIENT_CLINIC_OR_DEPARTMENT_OTHER): Payer: Self-pay

## 2024-06-04 MED ORDER — LIDOCAINE 5 % EX OINT: TOPICAL_OINTMENT | CUTANEOUS | 1 refills | Status: DC

## 2024-06-19 ENCOUNTER — Other Ambulatory Visit (HOSPITAL_BASED_OUTPATIENT_CLINIC_OR_DEPARTMENT_OTHER): Payer: Self-pay

## 2024-06-20 ENCOUNTER — Other Ambulatory Visit (HOSPITAL_BASED_OUTPATIENT_CLINIC_OR_DEPARTMENT_OTHER): Payer: Self-pay

## 2024-06-20 MED ORDER — LUPRON DEPOT (1-MONTH) 3.75 MG IM KIT
PACK | INTRAMUSCULAR | 0 refills | Status: DC
  Filled 2024-06-20: qty 1, 21d supply, fill #0

## 2024-06-22 DIAGNOSIS — Z79899 Other long term (current) drug therapy: Secondary | ICD-10-CM | POA: Diagnosis not present

## 2024-06-22 DIAGNOSIS — Z853 Personal history of malignant neoplasm of breast: Secondary | ICD-10-CM | POA: Diagnosis not present

## 2024-06-26 DIAGNOSIS — Z79899 Other long term (current) drug therapy: Secondary | ICD-10-CM | POA: Diagnosis not present

## 2024-07-05 ENCOUNTER — Other Ambulatory Visit (HOSPITAL_BASED_OUTPATIENT_CLINIC_OR_DEPARTMENT_OTHER): Payer: Self-pay

## 2024-07-05 MED ORDER — VALACYCLOVIR HCL 1 G PO TABS
1.0000 g | ORAL_TABLET | Freq: Two times a day (BID) | ORAL | 1 refills | Status: DC
  Filled 2024-07-05: qty 6, 3d supply, fill #0

## 2024-07-05 MED ORDER — VALACYCLOVIR HCL 1 G PO TABS: 1.0000 g | ORAL_TABLET | Freq: Two times a day (BID) | ORAL | 1 refills | Status: DC

## 2024-07-11 ENCOUNTER — Other Ambulatory Visit (HOSPITAL_BASED_OUTPATIENT_CLINIC_OR_DEPARTMENT_OTHER): Payer: Self-pay

## 2024-07-11 DIAGNOSIS — Z17411 Hormone receptor positive with human epidermal growth factor receptor 2 negative status: Secondary | ICD-10-CM | POA: Diagnosis not present

## 2024-07-11 DIAGNOSIS — C50911 Malignant neoplasm of unspecified site of right female breast: Secondary | ICD-10-CM | POA: Diagnosis not present

## 2024-07-11 MED ORDER — LUPRON DEPOT (1-MONTH) 3.75 MG IM KIT
3.7500 mg | PACK | INTRAMUSCULAR | 0 refills | Status: DC
  Filled 2024-07-11: qty 1, 21d supply, fill #0

## 2024-07-12 DIAGNOSIS — C50911 Malignant neoplasm of unspecified site of right female breast: Secondary | ICD-10-CM | POA: Diagnosis not present

## 2024-07-13 DIAGNOSIS — C50311 Malignant neoplasm of lower-inner quadrant of right female breast: Secondary | ICD-10-CM | POA: Diagnosis not present

## 2024-07-27 DIAGNOSIS — C50911 Malignant neoplasm of unspecified site of right female breast: Secondary | ICD-10-CM | POA: Diagnosis not present

## 2024-07-31 DIAGNOSIS — Z9882 Breast implant status: Secondary | ICD-10-CM | POA: Diagnosis not present

## 2024-07-31 DIAGNOSIS — C50911 Malignant neoplasm of unspecified site of right female breast: Secondary | ICD-10-CM | POA: Diagnosis not present

## 2024-07-31 DIAGNOSIS — Z9889 Other specified postprocedural states: Secondary | ICD-10-CM | POA: Diagnosis not present

## 2024-07-31 DIAGNOSIS — Z1239 Encounter for other screening for malignant neoplasm of breast: Secondary | ICD-10-CM | POA: Diagnosis not present

## 2024-07-31 DIAGNOSIS — Z853 Personal history of malignant neoplasm of breast: Secondary | ICD-10-CM | POA: Diagnosis not present

## 2024-07-31 DIAGNOSIS — Z9011 Acquired absence of right breast and nipple: Secondary | ICD-10-CM | POA: Diagnosis not present

## 2024-07-31 DIAGNOSIS — Z170 Estrogen receptor positive status [ER+]: Secondary | ICD-10-CM | POA: Diagnosis not present

## 2024-07-31 DIAGNOSIS — R92343 Mammographic extreme density, bilateral breasts: Secondary | ICD-10-CM | POA: Diagnosis not present

## 2024-08-01 ENCOUNTER — Other Ambulatory Visit (HOSPITAL_BASED_OUTPATIENT_CLINIC_OR_DEPARTMENT_OTHER): Payer: Self-pay

## 2024-08-01 MED ORDER — LUPRON DEPOT (1-MONTH) 3.75 MG IM KIT
3.7500 mg | PACK | INTRAMUSCULAR | 0 refills | Status: DC
  Filled 2024-08-01: qty 1, 21d supply, fill #0

## 2024-08-03 DIAGNOSIS — C50312 Malignant neoplasm of lower-inner quadrant of left female breast: Secondary | ICD-10-CM | POA: Diagnosis not present

## 2024-08-03 DIAGNOSIS — Z79899 Other long term (current) drug therapy: Secondary | ICD-10-CM | POA: Diagnosis not present

## 2024-08-07 ENCOUNTER — Encounter: Payer: Self-pay | Admitting: Family Medicine

## 2024-08-07 ENCOUNTER — Other Ambulatory Visit (HOSPITAL_BASED_OUTPATIENT_CLINIC_OR_DEPARTMENT_OTHER): Payer: Self-pay

## 2024-08-07 ENCOUNTER — Ambulatory Visit: Admitting: Family Medicine

## 2024-08-07 ENCOUNTER — Ambulatory Visit: Payer: Self-pay

## 2024-08-07 VITALS — BP 100/66 | HR 78 | Temp 97.6°F | Wt 101.4 lb

## 2024-08-07 DIAGNOSIS — R21 Rash and other nonspecific skin eruption: Secondary | ICD-10-CM | POA: Diagnosis not present

## 2024-08-07 DIAGNOSIS — T7840XA Allergy, unspecified, initial encounter: Secondary | ICD-10-CM

## 2024-08-07 MED ORDER — PREDNISONE 10 MG PO TABS
ORAL_TABLET | ORAL | 0 refills | Status: AC
  Filled 2024-08-07: qty 27, 10d supply, fill #0

## 2024-08-07 NOTE — Progress Notes (Signed)
 Established Patient Office Visit  Subjective   Patient ID: Tabitha Brown, female    DOB: 1981/03/18  Age: 43 y.o. MRN: [id number]  Chief Complaint  Patient presents with   Rash    HPI   Tabitha Brown is seen with pruritic rash which started a week ago today.  She has history of breast cancer and gets yearly breast MRI with contrast.  The evening after her most recent MRI 1 week ago she developed maculopapular pruritic rash.  She has tried Benadryl , other nonsedating antihistamines, and topical cortisone cream without much relief.  Having significant pruritus.  No prior history of contrast allergy.  No recent changes soaps or detergents.  Takes no other new medications.  She is allergic to sulfa.  No recent respiratory illness.  No fever.  No angioedema type symptoms.  Past Medical History:  Diagnosis Date   Breast mass 07/26/2017   Right breast lump x 1 month    Cancer (HCC)    Celiac disease    Eating disorder    Family history of cholangiocarcinoma    PONV (postoperative nausea and vomiting)    hypotensive passes out after epidurals   right breast ca    diagnosed 09/18/19   UTI (urinary tract infection)    Past Surgical History:  Procedure Laterality Date   APPENDECTOMY     AUGMENTATION MAMMAPLASTY Bilateral 2010   BREAST AUGMENTATION  2010   BREAST LUMPECTOMY WITH RADIOACTIVE SEED AND SENTINEL LYMPH NODE BIOPSY Right 10/18/2019   Procedure: RIGHT BREAST LUMPECTOMY WITH RADIOACTIVE SEED AND RIGHT AXILLARY SENTINEL LYMPH NODE BIOPSY;  Surgeon: Ebbie Cough, MD;  Location: [facility] OR;  Service: General;  Laterality: Right;   RE-EXCISION OF BREAST CANCER,SUPERIOR MARGINS Right 11/01/2019   Procedure: RE-EXCISION OF RIGHT BREAST CANCER MARGINS;  Surgeon: Ebbie Cough, MD;  Location: [facility];  Service: General;  Laterality: Right;    reports that she has never smoked. She has never used smokeless tobacco. She reports current alcohol use. She reports that she  does not use drugs. family history includes Alcohol abuse in her maternal grandfather; Cancer (age of onset: 51) in her maternal grandfather; Heart disease in her maternal grandfather and paternal grandfather; Hypertension in her maternal grandfather and paternal grandfather. Allergies  Allergen Reactions   Sulfa Antibiotics Hives   Sulfonylureas     Review of Systems  Constitutional:  Negative for chills and fever.  HENT:  Negative for congestion and sore throat.   Respiratory:  Negative for cough and shortness of breath.   Skin:  Positive for itching and rash.      Objective:     BP 100/66   Pulse 78   Temp 97.6 F (36.4 C) (Oral)   Wt 101 lb 6.4 oz (46 kg)   SpO2 98%   BMI 18.55 kg/m  BP Readings from Last 3 Encounters:  08/07/24 100/66  02/06/24 115/82  07/20/23 100/70   Wt Readings from Last 3 Encounters:  08/07/24 101 lb 6.4 oz (46 kg)  02/06/24 110 lb 9.6 oz (50.2 kg)  07/20/23 110 lb 8 oz (50.1 kg)      Physical Exam Vitals reviewed.  Constitutional:      General: She is not in acute distress.    Appearance: She is not ill-appearing.  Cardiovascular:     Rate and Rhythm: Normal rate and regular rhythm.  Skin:    Findings: Rash present.     Comments: She has maculopapular rash scattered diffusely involving trunk and  extremities.  Nonscaly.  Neurological:     Mental Status: She is alert.      No results found for any visits on 08/07/24.    The 10-year ASCVD risk score (Arnett DK, et al., 2019) is: 0.3%    Assessment & Plan:   Maculopapular pruritic rash which started several hours after her most recent MRI breast with contrast.  Suspect possible contrast allergic rash.  She has already tried topical steroid creams Benadryl  and nonsedating antihistamines without improvement.  Will start prednisone  taper starting at 40 mg daily.  She will discuss rash with her oncologist and radiology before getting any further contrast imaging in the future   No  follow-ups on file.    Wolm Scarlet, MD

## 2024-08-07 NOTE — Telephone Encounter (Signed)
 Appointment 08/07/2024 today at 10:45 AM with Dr Wolm Scarlet  FYI Only or Action Required?: FYI only for provider.  Patient was last seen in primary care on 07/20/2023 by [place], Betty G, MD.  Called Nurse Triage reporting Rash.  Symptoms began 8 days ago.  Interventions attempted: Rest, hydration, or home remedies.  Symptoms are: gradually worsening.  Triage Disposition: See Physician Within 24 Hours  Patient/caregiver understands and will follow disposition?: Yes              Copied from CRM #[id number]. Topic: Clinical - Red Word Triage >> Aug 07, 2024  8:44 AM Suzen RAMAN wrote: Red Word that prompted transfer to Nurse Triage: painful/itchy rash all over body. Per patient possible due to medication reaction. Seeking an appt Reason for Disposition  Hives or itching  Answer Assessment - Initial Assessment Questions MRI Tuesday with IV contrast---5 or 6 hours afterwards--itching and rash---Day 8---rash all over Lupron  injection--Friday (3 days ago) Rash started before this injection Patient denies any difficulty breathing, swelling, chest pain Patient denies any other known possible exposure to potential allergens that she is aware of Patient states this is Day 8 of this itching rash Patient advised to call us  with any changes Patient is advised that if anything worsens to go to the Emergency Room. Patient verbalized understanding.     1. APPEARANCE of RASH: What does the rash look like? (e.g., spots, blisters, raised areas, skin peeling, scaly)     Red areas all over and spreading 2. SIZE: How big are the spots? (e.g., tip of pen, eraser, coin; inches, centimeters)     variable 3. LOCATION: Where is the rash located?     All over 4. COLOR: What color is the rash? (Note: It is difficult to assess rash color in people with darker-colored skin. When this situation occurs, simply ask the caller to describe what they see.)     red 5. ONSET: When did the  rash begin?     8 days ago 6. FEVER: Do you have a fever? If Yes, ask: What is your temperature, how was it measured, and when did it start?     ---- 7. ITCHING: Does the rash itch? If Yes, ask: How bad is the itch? (Scale 1-10; or mild, moderate, severe)     Yes 8. CAUSE: What do you think is causing the rash?     Not sure if it was IV contrast from recent MRI or Lupron  injection   9. NEW MEDICINES: What new medicines are you taking? (e.g., name of antibiotic) When did you start taking this medication?.     Patient denies anything new 10. OTHER SYMPTOMS: Do you have any other symptoms? (e.g., sore throat, fever, joint pain)       Itching 11. PREGNANCY: Is there any chance you are pregnant? When was your last menstrual period?       ------  Protocols used: Rash - Widespread On Drugs-A-AH

## 2024-08-07 NOTE — Telephone Encounter (Signed)
Patient has appt 8/26

## 2024-08-14 ENCOUNTER — Ambulatory Visit: Admitting: Family Medicine

## 2024-08-22 ENCOUNTER — Other Ambulatory Visit (HOSPITAL_BASED_OUTPATIENT_CLINIC_OR_DEPARTMENT_OTHER): Payer: Self-pay

## 2024-08-23 ENCOUNTER — Other Ambulatory Visit (HOSPITAL_BASED_OUTPATIENT_CLINIC_OR_DEPARTMENT_OTHER): Payer: Self-pay

## 2024-08-23 MED ORDER — LUPRON DEPOT (1-MONTH) 3.75 MG IM KIT
PACK | INTRAMUSCULAR | 0 refills | Status: DC
  Filled 2024-08-23: qty 1, 21d supply, fill #0

## 2024-08-24 ENCOUNTER — Other Ambulatory Visit (HOSPITAL_BASED_OUTPATIENT_CLINIC_OR_DEPARTMENT_OTHER): Payer: Self-pay

## 2024-08-24 DIAGNOSIS — C50312 Malignant neoplasm of lower-inner quadrant of left female breast: Secondary | ICD-10-CM | POA: Diagnosis not present

## 2024-08-24 MED ORDER — LUPRON DEPOT (1-MONTH) 3.75 MG IM KIT
3.7500 mg | PACK | INTRAMUSCULAR | 7 refills | Status: AC
  Filled 2024-09-12: qty 1, 21d supply, fill #0
  Filled 2024-10-08: qty 1, 21d supply, fill #1
  Filled 2024-11-05: qty 1, 21d supply, fill #2
  Filled 2024-12-03: qty 1, 21d supply, fill #3
  Filled 2024-12-29 – 2024-12-31 (×2): qty 1, 21d supply, fill #4

## 2024-09-10 ENCOUNTER — Ambulatory Visit: Payer: Self-pay

## 2024-09-10 NOTE — Telephone Encounter (Signed)
  FYI Only or Action Required?: FYI only for provider.  Patient was last seen in primary care on 08/07/2024 by Micheal Wolm ORN, MD.  Called Nurse Triage reporting Chest Pain.  Symptoms began several weeks ago.  Interventions attempted: Nothing.  Symptoms are: unchanged.  Triage Disposition: See PCP When Office is Open (Within 3 Days)  Patient/caregiver understands and will follow disposition?: Yes   Copied from CRM #[id number]. Topic: Clinical - Red Word Triage >> Sep 10, 2024  8:35 AM Rea ORN wrote: Red Word that prompted transfer to Nurse Triage: chest tightness for past two weeks. Pt believes it from her cancer meds. Oncologist told her to call PCP to be evaluated. Reason for Disposition  [1] Chest pain from known angina comes and goes AND [2] is NOT happening more often (increasing in frequency) or getting worse (increasing in severity)  Answer Assessment - Initial Assessment Questions 1. LOCATION: Where does it hurt?       Center of chest 2. RADIATION: Does the pain go anywhere else? (e.g., into neck, jaw, arms, back)     Feels like a panic attack,  3. ONSET: When did the chest pain begin? (Minutes, hours or days)      Two weeks 4. PATTERN: Does the pain come and go, or has it been constant since it started?  Does it get worse with exertion?      Constant but doesn't wake up with it. 5. DURATION: How long does it last (e.g., seconds, minutes, hours)     constant 6. SEVERITY: How bad is the pain?  (e.g., Scale 1-10; mild, moderate, or severe)     Denies pain and states tight 7. CARDIAC RISK FACTORS: Do you have any history of heart problems or risk factors for heart disease? (e.g., angina, prior heart attack; diabetes, high blood pressure, high cholesterol, smoker, or strong family history of heart disease)     Concerned that it could be the chemo meds., breast cancer 8. PULMONARY RISK FACTORS: Do you have any history of lung disease?  (e.g., blood clots in  lung, asthma, emphysema, birth control pills)     denies 9. CAUSE: What do you think is causing the chest pain?     meds 10. OTHER SYMPTOMS: Do you have any other symptoms? (e.g., dizziness, nausea, vomiting, sweating, fever, difficulty breathing, cough)       no 11. PREGNANCY: Is there any chance you are pregnant? When was your last menstrual period?       na  Protocols used: Chest Pain-A-AH

## 2024-09-10 NOTE — Telephone Encounter (Signed)
 I agreed with recommendations given. Has an appt  tomorrow, 09/11/24. BJ

## 2024-09-11 ENCOUNTER — Encounter: Payer: Self-pay | Admitting: Family Medicine

## 2024-09-11 ENCOUNTER — Ambulatory Visit: Admitting: Family Medicine

## 2024-09-11 ENCOUNTER — Other Ambulatory Visit (HOSPITAL_BASED_OUTPATIENT_CLINIC_OR_DEPARTMENT_OTHER): Payer: Self-pay

## 2024-09-11 VITALS — BP 84/60 | HR 65 | Temp 98.0°F | Resp 16 | Ht 62.0 in | Wt 105.0 lb

## 2024-09-11 DIAGNOSIS — F419 Anxiety disorder, unspecified: Secondary | ICD-10-CM

## 2024-09-11 DIAGNOSIS — R0789 Other chest pain: Secondary | ICD-10-CM

## 2024-09-11 MED ORDER — LORAZEPAM 0.5 MG PO TABS
0.2500 mg | ORAL_TABLET | Freq: Every day | ORAL | 0 refills | Status: AC | PRN
  Filled 2024-09-11: qty 20, 20d supply, fill #0

## 2024-09-11 NOTE — Progress Notes (Addendum)
 ACUTE VISIT Chief Complaint  Patient presents with   Chest Pain    Patient complains of chest tightness x2 weeks, denies pain with exertion or exercise, used Lorazepam  with no relief   Discussed the use of AI scribe software for clinical note transcription with the patient, who gave verbal consent to proceed.  History of Present Illness Tabitha Brown is a 43 year old female with past medical history significant for right breast cancer and ovarian suppression therapy, anxiety, celiac disease, and cervical radiculopathy here today complaining of chest tightness as described above.  She has been experiencing chest tightness for the past two weeks, located in the middle of her upper chest. The sensation is described as a persistent squeezing feeling, similar to a 'bad panic attack', but it improves with physical activity such as working out or walking with a weighted bench.  Tightness is almost constant,mild,and it is not radiated. She has not identified exacerbating factors.  No associated shortness of breath, palpitation, diaphoresis, cough, wheezing, or nausea. The tightness does not change with eating or with movement. She has not noted heartburn or acid reflux. No history of trauma or recent procedures. No lower extremity edema.  Anxiety: She has had elevated alkaline phosphatase, which improved after discontinuing Lexapro  last year.  She occasionally uses lorazepam  0.25 mg, which was prescribed last year, she does not take it frequently. She has five pills remaining. She has tolerated medication well, no side effects reported.  Currently, she is on Lupron  and letrozole , with nine months remaining on this regimen before transitioning to tamoxifen. Since the increase in Lupron  dosage, she has experienced worsening side effects, including hair loss and weight loss, despite maintaining a healthy diet with a goal of 100 grams of protein per day.  She recalls a significant  gastrointestinal illness in June while in [place], which led to a prolonged period of poor appetite and weight loss.   Review of Systems  Constitutional:  Positive for fatigue. Negative for activity change, appetite change, chills and fever.  HENT:  Negative for mouth sores, sore throat and trouble swallowing.   Respiratory:  Negative for stridor.   Gastrointestinal:  Negative for abdominal pain, nausea and vomiting.  Endocrine: Negative for cold intolerance and heat intolerance.  Musculoskeletal:  Negative for joint swelling.  Skin:  Negative for rash.  Neurological:  Negative for syncope, weakness and headaches.  Psychiatric/Behavioral:  Negative for confusion and hallucinations.   See other pertinent positives and negatives in HPI.  Current Outpatient Medications on File Prior to Visit  Medication Sig Dispense Refill   BIOTIN PO Take by mouth daily.     letrozole  (FEMARA ) 2.5 MG tablet Take 2.5 mg by mouth daily.     letrozole  (FEMARA ) 2.5 MG tablet Take 1 tablet (2.5 mg total) by mouth daily. 90 tablet 3   Leuprolide  Acetate (LUPRON  DEPOT, 26-MONTH, IM) Inject into the muscle.     LUPRON  DEPOT, 26-MONTH, 3.75 MG injection Inject 3.75 mg into the muscle every 21 ( twenty-one) days. 1 each 0   LUPRON  DEPOT, 26-MONTH, 3.75 MG injection Inject 3.75 mg into the muscle every 21 ( twenty-one) days. 1 each 7   Multiple Vitamin (MULTIVITAMIN ADULT PO) Take by mouth.     nystatin -triamcinolone  (MYCOLOG II) cream APPLY TO THE EXTERNAL AFFECTED AREAS TWICE A DAY AS NEEDED 60 g 3   OVER THE COUNTER MEDICATION Vitamin D3 with K2-once a day     tretinoin  (RETIN-A ) 0.05 % cream Apply a pea  size amount every other night then nightly as tolerated. 45 g 4   triamcinolone  (KENALOG ) 0.025 % cream Apply to the affected area twice daily prn. 80 g 0   No current facility-administered medications on file prior to visit.    Past Medical History:  Diagnosis Date   Breast mass 07/26/2017   Right breast lump x  1 month    Cancer (HCC)    Celiac disease    Eating disorder    Family history of cholangiocarcinoma    PONV (postoperative nausea and vomiting)    hypotensive passes out after epidurals   right breast ca    diagnosed 09/18/19   UTI (urinary tract infection)    Allergies  Allergen Reactions   Sulfa Antibiotics Hives   Sulfonylureas    Ivp Dye [Iodinated Contrast Media] Rash    Severe rash-Prednisone  given    Social History   Socioeconomic History   Marital status: Married    Spouse name: Melea Prezioso   Number of children: 2   Years of education: Not on file   Highest education level: Not on file  Occupational History   Occupation: dentist  Tobacco Use   Smoking status: Never   Smokeless tobacco: Never  Vaping Use   Vaping status: Never Used  Substance and Sexual Activity   Alcohol use: Yes    Comment: 1 glass of wine a wk   Drug use: No   Sexual activity: Yes  Other Topics Concern   Not on file  Social History Narrative   Not on file   Social Drivers of Health   Financial Resource Strain: Not on file  Food Insecurity: Not on file  Transportation Needs: Not on file  Physical Activity: Not on file  Stress: Not on file  Social Connections: Unknown (04/25/2022)   [facility]   Social Network    Social Network: Not on file   Vitals:   09/11/24 0739  BP: (!) 84/60  Pulse: 65  Resp: 16  Temp: 98 F (36.7 C)  SpO2: 100%   Body mass index is 19.2 kg/m. Wt Readings from Last 3 Encounters:  09/11/24 105 lb (47.6 kg)  08/07/24 101 lb 6.4 oz (46 kg)  02/06/24 110 lb 9.6 oz (50.2 kg)   Physical Exam Vitals and nursing note reviewed.  Constitutional:      General: She is not in acute distress.    Appearance: She is well-developed.  HENT:     Head: Normocephalic and atraumatic.  Eyes:     Conjunctiva/sclera: Conjunctivae normal.  Cardiovascular:     Rate and Rhythm: Normal rate and regular rhythm.     Heart sounds: No murmur  heard. Pulmonary:     Effort: Pulmonary effort is normal. No respiratory distress.     Breath sounds: Normal breath sounds.  Chest:     Chest wall: No tenderness.  Abdominal:     Palpations: Abdomen is soft. There is no hepatomegaly or mass.     Tenderness: There is no abdominal tenderness.  Musculoskeletal:     Right lower leg: No edema.     Left lower leg: No edema.     Comments: CP is not elicited with upper extremity range of motion.  Lymphadenopathy:     Cervical: No cervical adenopathy.  Skin:    General: Skin is warm.     Findings: No erythema or rash.  Neurological:     General: No focal deficit present.     Mental  Status: She is alert and oriented to person, place, and time.     Cranial Nerves: No cranial nerve deficit.     Gait: Gait normal.  Psychiatric:        Mood and Affect: Mood and affect normal.   ASSESSMENT AND PLAN:  Pegge Cumberledge was seen today for chest tightness.  Chest tightness We discussed possible etiologies, history and examination today do not suggest a serious process.  Explained that the probability of this being caused by a cardiac condition is low. She prefers to hold on CXR and EKG, I do not think neither one is needed at this time. GI problems and anxiety are to be considered.  She is not interested in trial of PPI. Some of her medications could be contributing factor, especially letrozole , which can cause musculoskeletal pain. She is seeing her oncologist in a few weeks, will discuss problem with provider and will let me know if she is interested in further workup. Continue monitoring for new symptoms.  BP noted low, asymptomatic. Continue adequate hydration.  Anxiety disorder, unspecified type A general problem is stable. She is no longer on Lexapro , at this time she is not interested in resuming medication but she will let me know if she decides to do so.  If needed, we could also consider mirtazapine at bedtime, which will also help with  appetite. Continue lorazepam  0.25 mg 1/2 to 1 tablet daily as needed. As far as problem is stable, we can continue annual follow-ups.  -     LORazepam ; Take 0.5-1 tablets (0.25-0.5 mg total) by mouth daily as needed for anxiety.  Dispense: 20 tablet; Refill: 0  Return in about 1 year (around 09/11/2025) for chronic problems.  Sakia Schrimpf G. [place], MD  [facility]. [place] office.

## 2024-09-12 ENCOUNTER — Other Ambulatory Visit (HOSPITAL_BASED_OUTPATIENT_CLINIC_OR_DEPARTMENT_OTHER): Payer: Self-pay

## 2024-09-13 ENCOUNTER — Other Ambulatory Visit (HOSPITAL_COMMUNITY): Payer: Self-pay

## 2024-09-14 DIAGNOSIS — Z853 Personal history of malignant neoplasm of breast: Secondary | ICD-10-CM | POA: Diagnosis not present

## 2024-09-18 DIAGNOSIS — Z170 Estrogen receptor positive status [ER+]: Secondary | ICD-10-CM | POA: Diagnosis not present

## 2024-09-18 DIAGNOSIS — C50919 Malignant neoplasm of unspecified site of unspecified female breast: Secondary | ICD-10-CM | POA: Diagnosis not present

## 2024-10-08 ENCOUNTER — Other Ambulatory Visit (HOSPITAL_BASED_OUTPATIENT_CLINIC_OR_DEPARTMENT_OTHER): Payer: Self-pay

## 2024-10-09 DIAGNOSIS — C50411 Malignant neoplasm of upper-outer quadrant of right female breast: Secondary | ICD-10-CM | POA: Diagnosis not present

## 2024-10-21 DIAGNOSIS — J029 Acute pharyngitis, unspecified: Secondary | ICD-10-CM | POA: Diagnosis not present

## 2024-10-21 DIAGNOSIS — J069 Acute upper respiratory infection, unspecified: Secondary | ICD-10-CM | POA: Diagnosis not present

## 2024-10-30 ENCOUNTER — Ambulatory Visit: Payer: Self-pay

## 2024-10-30 NOTE — Telephone Encounter (Signed)
 FYI Only or Action Required?: FYI only for provider: appointment scheduled on 10/31/2024.  Patient was last seen in primary care on 09/11/2024 by Jordan, Betty G, MD.  Called Nurse Triage reporting Weight Loss.  Symptoms began several months ago.  Interventions attempted: Nothing.  Symptoms are: gradually worsening.  Triage Disposition: See Physician Within 24 Hours  Patient/caregiver understands and will follow disposition?: Yes         Copied from CRM [id number]. Topic: Clinical - Red Word Triage >> Oct 30, 2024 10:51 AM Tabitha Brown wrote: [facility] that prompted transfer to Nurse Triage: Patient has experiencing the following symptoms the past few months on and off:  Nausea  Weight loss Hair loss - Previously had cancer and these are concerning  - Looking to schedule appt. With PCP  *Transf to NT** Reason for Disposition  SEVERE weight loss (e.g., BMI < 15; weight loss that is rapid; taking little or no food by mouth)  Answer Assessment - Initial Assessment Questions 1. MAIN CONCERN: What is your main concern today?     Weight loss  2. WEIGHT LOSS: How much weight have you lost?  (e.g., lbs., kgs.)  Over what period of time have you lost this weight?  (e.g., number of days, weeks, months, years)    Is around 99 lbs lost about 6 lbs  3. BASELINE WEIGHT: What is your baseline or normal weight? (e.g., How much do you usually weigh?)     104lbs -105 lbs  4. CAUSE: What do you think is causing the weight loss? (e.g., depression, anxiety, medicine side effect, pain, trouble swallowing, substance or alcohol use problem, eating disorder)     Unsure  5. PRIOR EVALUATION: Have you been evaluated by a doctor for your weight loss? If Yes, ask When was your last visit? What did your doctor (or NP/PA) tell you about the possible cause?     Denies  6. OTHER SYMPTOMS: Do you have any other symptoms? (e.g., anxiety or depression, blood in stool, breathing difficulty,  diarrhea, fever, trouble swallowing)     Nausea, hair loss, food sits In stomach for long periods off time. She states he never feels hungry.  Protocols used: Weight Loss - Unintended-A-AH

## 2024-10-31 ENCOUNTER — Encounter: Payer: Self-pay | Admitting: Family Medicine

## 2024-10-31 ENCOUNTER — Other Ambulatory Visit (HOSPITAL_BASED_OUTPATIENT_CLINIC_OR_DEPARTMENT_OTHER): Payer: Self-pay

## 2024-10-31 ENCOUNTER — Ambulatory Visit: Admitting: Family Medicine

## 2024-10-31 VITALS — BP 116/82 | HR 64 | Temp 97.9°F | Resp 16 | Ht 62.0 in | Wt 104.2 lb

## 2024-10-31 DIAGNOSIS — R1013 Epigastric pain: Secondary | ICD-10-CM | POA: Diagnosis not present

## 2024-10-31 DIAGNOSIS — R112 Nausea with vomiting, unspecified: Secondary | ICD-10-CM

## 2024-10-31 DIAGNOSIS — R634 Abnormal weight loss: Secondary | ICD-10-CM | POA: Diagnosis not present

## 2024-10-31 MED ORDER — PANTOPRAZOLE SODIUM 20 MG PO TBEC
20.0000 mg | DELAYED_RELEASE_TABLET | Freq: Every day | ORAL | 0 refills | Status: AC
  Filled 2024-10-31: qty 60, 60d supply, fill #0

## 2024-10-31 NOTE — Progress Notes (Unsigned)
 ACUTE VISIT Chief Complaint  Patient presents with   Weight Loss    Hair loss Started after traveling to [place] in July, came back w/ travelers diarrhea, since then she has had no appetite, foods sits in stomach       HPI: TabithaTabitha Brown is a 43 y.o. female, who is here today complaining of *** HPI Discussed the use of AI scribe software for clinical note transcription with the patient, who gave verbal consent to proceed.  History of Present Illness Tabitha Brown is a 43 year old female with celiac disease who presents with persistent nausea and gastrointestinal discomfort.  She has experienced persistent nausea and gastrointestinal discomfort since returning from a trip to [place] in June. She feels queasy almost every time she eats, leading to a lack of appetite and weight loss from 104-105 pounds to 99 pounds, confirmed at home today.  She describes a sensation of fullness in her stomach, particularly after eating, which sometimes lasts for hours and occasionally leads to vomiting. She feels better after vomiting and describes the sensation as 'like a ball of food' sitting in her stomach, which has occurred a couple of times.  No changes in bowel habits, such as diarrhea or blood in the stool. She recalls having heartburn initially, but it has since improved. She reports significant hair loss starting in September, described as 'handfuls' of hair shedding, although this has slowed down recently.  Her medication regimen includes leuprolide  injections, which were increased in frequency in April but are now being spaced out again. She felt better when on steroids following an MRI in August, which was given due to a reaction to iodine dye.  She has a history of celiac disease, confirmed by biopsy, and a strong family history of the condition. She also mentions concerns about potential insulin resistance, as her blood sugar levels spike significantly after consuming certain  foods, despite a normal A1c test.  She is concerned about the possibility of a lingering infection or inflammation from her trip to [place], as both she and her husband experienced prolonged queasiness after returning. She has not started any new medications recently and denies any difficulties swallowing.   Lab Results  Component Value Date   TSH 2.65 01/31/2018    Review of Systems See other pertinent positives and negatives in HPI.  Current Outpatient Medications on File Prior to Visit  Medication Sig Dispense Refill   BIOTIN PO Take by mouth daily.     letrozole  (FEMARA ) 2.5 MG tablet Take 1 tablet (2.5 mg total) by mouth daily. 90 tablet 3   LORazepam  (ATIVAN ) 0.5 MG tablet Take 0.5-1 tablets (0.25-0.5 mg total) by mouth daily as needed for anxiety. 20 tablet 0   LUPRON  DEPOT, 34-MONTH, 3.75 MG injection Inject 3.75 mg into the muscle every 21 ( twenty-one) days. 1 each 7   Multiple Vitamin (MULTIVITAMIN ADULT PO) Take by mouth.     nystatin -triamcinolone  (MYCOLOG II) cream APPLY TO THE EXTERNAL AFFECTED AREAS TWICE A DAY AS NEEDED (Patient not taking: Reported on 10/31/2024) 60 g 3   OVER THE COUNTER MEDICATION Vitamin D3 with K2-once a day (Patient not taking: Reported on 10/31/2024)     tretinoin  (RETIN-A ) 0.05 % cream Apply a pea size amount every other night then nightly as tolerated. (Patient not taking: Reported on 10/31/2024) 45 g 4   triamcinolone  (KENALOG ) 0.025 % cream Apply to the affected area twice daily prn. (Patient not taking: Reported on 10/31/2024) 80 g 0  No current facility-administered medications on file prior to visit.    Past Medical History:  Diagnosis Date   Breast mass 07/26/2017   Right breast lump x 1 month    Cancer (HCC)    Celiac disease    Eating disorder    Family history of cholangiocarcinoma    PONV (postoperative nausea and vomiting)    hypotensive passes out after epidurals   right breast ca    diagnosed 09/18/19   UTI (urinary tract  infection)    Allergies  Allergen Reactions   Sulfa Antibiotics Hives   Sulfonylureas    Ivp Dye [Iodinated Contrast Media] Rash    Severe rash-Prednisone  given    Social History   Socioeconomic History   Marital status: Married    Spouse name: Zamariya Neal   Number of children: 2   Years of education: Not on file   Highest education level: Not on file  Occupational History   Occupation: dentist  Tobacco Use   Smoking status: Never   Smokeless tobacco: Never  Vaping Use   Vaping status: Never Used  Substance and Sexual Activity   Alcohol use: Yes    Comment: 1 glass of wine a wk   Drug use: No   Sexual activity: Yes  Other Topics Concern   Not on file  Social History Narrative   Not on file   Social Drivers of Health   Financial Resource Strain: Not on file  Food Insecurity: Not on file  Transportation Needs: Not on file  Physical Activity: Not on file  Stress: Not on file  Social Connections: Unknown (04/25/2022)   [facility]   Social Network    Social Network: Not on file    Vitals:   10/31/24 1345  BP: 116/82  Pulse: 64  Resp: 16  Temp: 97.9 F (36.6 C)  SpO2: 99%   Wt Readings from Last 3 Encounters:  10/31/24 104 lb 4 oz (47.3 kg)  09/11/24 105 lb (47.6 kg)  08/07/24 101 lb 6.4 oz (46 kg)   Body mass index is 19.07 kg/m.  Physical Exam Vitals and nursing note reviewed.  Constitutional:      General: She is not in acute distress.    Appearance: She is well-developed.  HENT:     Head: Normocephalic and atraumatic.     Mouth/Throat:     Mouth: Mucous membranes are moist.     Pharynx: Oropharynx is clear.  Eyes:     Conjunctiva/sclera: Conjunctivae normal.  Cardiovascular:     Rate and Rhythm: Normal rate and regular rhythm.     Pulses:          Dorsalis pedis pulses are 2+ on the right side and 2+ on the left side.     Heart sounds: No murmur heard. Pulmonary:     Effort: Pulmonary effort is normal. No respiratory  distress.     Breath sounds: Normal breath sounds.  Abdominal:     Palpations: Abdomen is soft. There is no hepatomegaly or mass.     Tenderness: There is no abdominal tenderness.  Lymphadenopathy:     Cervical: No cervical adenopathy.  Skin:    General: Skin is warm.     Findings: No erythema or rash.  Neurological:     General: No focal deficit present.     Mental Status: She is alert and oriented to person, place, and time.     Cranial Nerves: No cranial nerve deficit.  Gait: Gait normal.  Psychiatric:     Comments: Well groomed, good eye contact.     ASSESSMENT AND PLAN: There are no diagnoses linked to this encounter.  No follow-ups on file.  Parv Manthey G. Eileen Croswell, MD  [facility]. [place] office.  Discharge Instructions   None

## 2024-11-01 ENCOUNTER — Encounter: Payer: Self-pay | Admitting: Family Medicine

## 2024-11-05 ENCOUNTER — Other Ambulatory Visit (HOSPITAL_BASED_OUTPATIENT_CLINIC_OR_DEPARTMENT_OTHER): Payer: Self-pay

## 2024-11-06 DIAGNOSIS — C50411 Malignant neoplasm of upper-outer quadrant of right female breast: Secondary | ICD-10-CM | POA: Diagnosis not present

## 2024-11-30 DIAGNOSIS — L570 Actinic keratosis: Secondary | ICD-10-CM | POA: Diagnosis not present

## 2024-11-30 MED ORDER — FLUOROURACIL 5 % EX CREA
TOPICAL_CREAM | CUTANEOUS | 2 refills | Status: DC
  Filled 2024-11-30: qty 40, 14d supply, fill #0

## 2024-12-03 ENCOUNTER — Other Ambulatory Visit (HOSPITAL_BASED_OUTPATIENT_CLINIC_OR_DEPARTMENT_OTHER): Payer: Self-pay

## 2024-12-04 ENCOUNTER — Other Ambulatory Visit (HOSPITAL_BASED_OUTPATIENT_CLINIC_OR_DEPARTMENT_OTHER): Payer: Self-pay

## 2024-12-04 DIAGNOSIS — C50919 Malignant neoplasm of unspecified site of unspecified female breast: Secondary | ICD-10-CM | POA: Diagnosis not present

## 2024-12-05 ENCOUNTER — Other Ambulatory Visit (HOSPITAL_BASED_OUTPATIENT_CLINIC_OR_DEPARTMENT_OTHER): Payer: Self-pay

## 2024-12-05 MED ORDER — LETROZOLE 2.5 MG PO TABS
2.5000 mg | ORAL_TABLET | Freq: Every day | ORAL | 3 refills | Status: DC
  Filled 2024-12-05: qty 90, 90d supply, fill #0

## 2024-12-29 ENCOUNTER — Other Ambulatory Visit (HOSPITAL_BASED_OUTPATIENT_CLINIC_OR_DEPARTMENT_OTHER): Payer: Self-pay

## 2024-12-31 ENCOUNTER — Other Ambulatory Visit: Payer: Self-pay

## 2024-12-31 ENCOUNTER — Other Ambulatory Visit (HOSPITAL_BASED_OUTPATIENT_CLINIC_OR_DEPARTMENT_OTHER): Payer: Self-pay

## 2025-01-08 ENCOUNTER — Ambulatory Visit: Admitting: Gastroenterology

## 2025-01-14 ENCOUNTER — Ambulatory Visit: Admitting: Gastroenterology

## 2025-01-15 ENCOUNTER — Other Ambulatory Visit

## 2025-01-15 ENCOUNTER — Encounter: Payer: Self-pay | Admitting: Gastroenterology

## 2025-01-15 ENCOUNTER — Ambulatory Visit: Admitting: Gastroenterology

## 2025-01-15 VITALS — BP 104/78 | HR 92 | Ht 62.0 in | Wt 106.1 lb

## 2025-01-15 DIAGNOSIS — R7401 Elevation of levels of liver transaminase levels: Secondary | ICD-10-CM | POA: Diagnosis not present

## 2025-01-15 DIAGNOSIS — R140 Abdominal distension (gaseous): Secondary | ICD-10-CM | POA: Diagnosis not present

## 2025-01-15 DIAGNOSIS — K5909 Other constipation: Secondary | ICD-10-CM

## 2025-01-15 DIAGNOSIS — K219 Gastro-esophageal reflux disease without esophagitis: Secondary | ICD-10-CM | POA: Diagnosis not present

## 2025-01-15 DIAGNOSIS — R112 Nausea with vomiting, unspecified: Secondary | ICD-10-CM | POA: Diagnosis not present

## 2025-01-15 DIAGNOSIS — K900 Celiac disease: Secondary | ICD-10-CM

## 2025-01-15 DIAGNOSIS — Z170 Estrogen receptor positive status [ER+]: Secondary | ICD-10-CM

## 2025-01-15 NOTE — Patient Instructions (Addendum)
 Please go to the lab in the basement of our building to have lab work done as you leave today. Hit B for basement when you get on the elevator.  When the doors open the lab is on your left.  We will call you with the results. Thank you.  Your provider has ordered Diatherix stool testing for you. You have received a kit from our office today containing all necessary supplies to complete this test. Please carefully read the stool collection instructions provided in the kit before opening the accompanying materials. In addition, be sure there is a label providing your full name and date of birth on the puritan opti-swab tube that is supplied in the kit (if you do not see a label with this information on your test tube, please make us  aware before test collection!). After completing the test, you should secure the purtian tube into the specimen biohazard bag. The [facility] E-Req sheet (including date and time of specimen collection) should be placed into the outside pocket of the specimen biohazard bag and returned to the [place] lab (basement floor of [facility]) within 3 days of collection. Please make sure to give the specimen to a staff member at the lab. DO NOT leave the specimen on the counter.   If the specimen date and time (can be found in the upper right boxed portion of the sheet) are not filled out on the E-Req sheet, the test will NOT be performed.    Thank you for trusting me with your gastrointestinal care!   Nestor Blower, PA  _______________________________________________________  If your blood pressure at your visit was 140/90 or greater, please contact your primary care physician to follow up on this.  _______________________________________________________  If you are age 62 or older, your body mass index should be between 23-30. Your Body mass index is 19.41 kg/m. If this is out of the aforementioned range listed, please consider follow up  with your Primary Care Provider.  If you are age 85 or younger, your body mass index should be between 19-25. Your Body mass index is 19.41 kg/m. If this is out of the aformentioned range listed, please consider follow up with your Primary Care Provider.   ________________________________________________________  The [place] [facility] providers would like to encourage you to use MYCHART to communicate with providers for non-urgent requests or questions.  Due to long hold times on the telephone, sending your provider a message by [facility] may be a faster and more efficient way to get a response.  Please allow 48 business hours for a response.  Please remember that this is for non-urgent requests.  _______________________________________________________  [facility] is using a team-based approach to care.  Your team is made up of your doctor and two to three APPS. Our APPS (Nurse Practitioners and Physician Assistants) work with your physician to ensure care continuity for you. They are fully qualified to address your health concerns and develop a treatment plan. They communicate directly with your gastroenterologist to care for you. Seeing the Advanced Practice Practitioners on your physician's team can help you by facilitating care more promptly, often allowing for earlier appointments, access to diagnostic testing, procedures, and other specialty referrals.   Due to recent changes in healthcare laws, you may see the results of your imaging and laboratory studies on MyChart before your provider has had a chance to review them.  We understand that in some cases there may be results that are confusing or concerning  to you. Not all laboratory results come back in the same time frame and the provider may be waiting for multiple results in order to interpret others.  Please give us  48 hours in order for your provider to thoroughly review all the results before contacting the office for clarification of  your results.

## 2025-01-15 NOTE — Progress Notes (Signed)
 Agree with assessment / plan as outlined.

## 2025-01-17 LAB — IGA: Immunoglobulin A: 369 mg/dL — ABNORMAL HIGH (ref 47–310)
